# Patient Record
Sex: Female | Born: 1940 | Race: Black or African American | Hispanic: No | Marital: Married | Smoking: Former smoker
Health system: Southern US, Community
[De-identification: ages and names within clinical notes are randomized; demographics above are authoritative.]

## PROBLEM LIST (undated history)

## (undated) DIAGNOSIS — E669 Obesity, unspecified: Secondary | ICD-10-CM

## (undated) DIAGNOSIS — E559 Vitamin D deficiency, unspecified: Secondary | ICD-10-CM

## (undated) DIAGNOSIS — I219 Acute myocardial infarction, unspecified: Secondary | ICD-10-CM

## (undated) DIAGNOSIS — C50919 Malignant neoplasm of unspecified site of unspecified female breast: Secondary | ICD-10-CM

## (undated) DIAGNOSIS — C801 Malignant (primary) neoplasm, unspecified: Secondary | ICD-10-CM

## (undated) DIAGNOSIS — F419 Anxiety disorder, unspecified: Secondary | ICD-10-CM

## (undated) DIAGNOSIS — E785 Hyperlipidemia, unspecified: Secondary | ICD-10-CM

## (undated) DIAGNOSIS — I252 Old myocardial infarction: Secondary | ICD-10-CM

## (undated) DIAGNOSIS — I119 Hypertensive heart disease without heart failure: Secondary | ICD-10-CM

## (undated) DIAGNOSIS — Z86010 Personal history of colonic polyps: Secondary | ICD-10-CM

## (undated) DIAGNOSIS — I2510 Atherosclerotic heart disease of native coronary artery without angina pectoris: Secondary | ICD-10-CM

## (undated) DIAGNOSIS — N189 Chronic kidney disease, unspecified: Secondary | ICD-10-CM

## (undated) HISTORY — DX: Old myocardial infarction: I25.2

## (undated) HISTORY — DX: Atherosclerotic heart disease of native coronary artery without angina pectoris: I25.10

## (undated) HISTORY — DX: Hyperlipidemia, unspecified: E78.5

## (undated) HISTORY — PX: BREAST LUMPECTOMY: SHX2

## (undated) HISTORY — PX: BREAST BIOPSY: SHX20

## (undated) HISTORY — DX: Obesity, unspecified: E66.9

## (undated) HISTORY — PX: EYE SURGERY: SHX253

## (undated) HISTORY — DX: Hypertensive heart disease without heart failure: I11.9

## (undated) HISTORY — DX: Vitamin D deficiency, unspecified: E55.9

## (undated) HISTORY — DX: Acute myocardial infarction, unspecified: I21.9

## (undated) HISTORY — PX: BREAST EXCISIONAL BIOPSY: SUR124

## (undated) HISTORY — DX: Personal history of colonic polyps: Z86.010

## (undated) HISTORY — PX: TONSILLECTOMY: SUR1361

---

## 1990-09-06 HISTORY — PX: CORONARY ARTERY BYPASS GRAFT: SHX141

## 2001-07-04 ENCOUNTER — Other Ambulatory Visit: Admission: RE | Admit: 2001-07-04 | Discharge: 2001-07-04 | Payer: Self-pay | Admitting: Emergency Medicine

## 2001-07-07 ENCOUNTER — Encounter: Admission: RE | Admit: 2001-07-07 | Discharge: 2001-07-07 | Payer: Self-pay | Admitting: Emergency Medicine

## 2001-07-07 ENCOUNTER — Encounter: Payer: Self-pay | Admitting: Emergency Medicine

## 2001-07-24 ENCOUNTER — Encounter: Admission: RE | Admit: 2001-07-24 | Discharge: 2001-07-24 | Payer: Self-pay | Admitting: Emergency Medicine

## 2001-07-24 ENCOUNTER — Encounter: Payer: Self-pay | Admitting: Emergency Medicine

## 2002-07-26 ENCOUNTER — Encounter: Admission: RE | Admit: 2002-07-26 | Discharge: 2002-07-26 | Payer: Self-pay | Admitting: Emergency Medicine

## 2002-07-26 ENCOUNTER — Encounter: Payer: Self-pay | Admitting: Emergency Medicine

## 2004-02-06 ENCOUNTER — Encounter: Admission: RE | Admit: 2004-02-06 | Discharge: 2004-02-06 | Payer: Self-pay | Admitting: Emergency Medicine

## 2005-07-06 ENCOUNTER — Encounter: Admission: RE | Admit: 2005-07-06 | Discharge: 2005-07-06 | Payer: Self-pay | Admitting: Emergency Medicine

## 2006-10-27 ENCOUNTER — Encounter: Admission: RE | Admit: 2006-10-27 | Discharge: 2006-10-27 | Payer: Self-pay | Admitting: Emergency Medicine

## 2006-11-14 ENCOUNTER — Encounter (INDEPENDENT_AMBULATORY_CARE_PROVIDER_SITE_OTHER): Payer: Self-pay | Admitting: *Deleted

## 2006-11-14 ENCOUNTER — Ambulatory Visit (HOSPITAL_COMMUNITY): Admission: RE | Admit: 2006-11-14 | Discharge: 2006-11-14 | Payer: Self-pay | Admitting: Gastroenterology

## 2008-04-11 ENCOUNTER — Encounter: Admission: RE | Admit: 2008-04-11 | Discharge: 2008-04-11 | Payer: Self-pay | Admitting: Emergency Medicine

## 2009-01-24 ENCOUNTER — Encounter: Admission: RE | Admit: 2009-01-24 | Discharge: 2009-01-24 | Payer: Self-pay | Admitting: Emergency Medicine

## 2009-01-31 ENCOUNTER — Ambulatory Visit: Payer: Self-pay | Admitting: Gynecology

## 2009-02-15 ENCOUNTER — Emergency Department (HOSPITAL_COMMUNITY): Admission: EM | Admit: 2009-02-15 | Discharge: 2009-02-15 | Payer: Self-pay | Admitting: Emergency Medicine

## 2010-07-08 ENCOUNTER — Encounter: Admission: RE | Admit: 2010-07-08 | Discharge: 2010-07-08 | Payer: Self-pay | Admitting: Emergency Medicine

## 2010-12-14 LAB — URINALYSIS, ROUTINE W REFLEX MICROSCOPIC
Bilirubin Urine: NEGATIVE
Hgb urine dipstick: NEGATIVE
Specific Gravity, Urine: 1.014 (ref 1.005–1.030)

## 2011-01-22 NOTE — Op Note (Signed)
NAME:  Alyssa Cardenas, Alyssa Cardenas               ACCOUNT NO.:  [id number]   MEDICAL RECORD NO.:  [id number]          PATIENT TYPE:  AMB   LOCATION:  ENDO                         FACILITY:  [facility]   PHYSICIAN:  Anselmo Rod, M.D.  DATE OF BIRTH:  12-07-1940   DATE OF PROCEDURE:  11/14/2006  DATE OF DISCHARGE:                               OPERATIVE REPORT   PROCEDURE PERFORMED:  Colonoscopy with snare polypectomy x4.   ENDOSCOPIST:  Anselmo Rod, M.D.   INSTRUMENT USED:  Pentax video colonoscope.   INDICATIONS FOR PROCEDURE:  70 year old African American female  undergoing screening colonoscopy to rule out colonic polyps, mass, etc.   PREPROCEDURE PREPARATION:  Informed consent was procured from the  patient. The patient fasted for four hours prior to the procedure and  prepped with 20 Osmoprep pills the night of and 12 Osmoprep pills the  morning of the procedure.  Risks and benefits of the procedure including  a 10% miss rate of cancer and polyps were discussed with the patient as  well.   PREPROCEDURE PHYSICAL:  The patient had stable vital signs.  Neck  supple. Chest clear to auscultation.  S1, S2 regular. Abdomen soft with  normal bowel sounds.   DESCRIPTION OF PROCEDURE:  The patient was placed in the left lateral  decubitus position and sedated with 75 mcg of Fentanyl and 7.5 mg of  Versed given intravenously in slow incremental doses.  Once the patient  was adequately sedated and maintained on low-flow oxygen and continuous  cardiac monitoring, the Pentax video colonoscope was advanced from the  rectum to the cecum.  There was some residual stool in the colon.  Multiple washes were done. The appendiceal orifice and ileocecal valve  were clearly visualized and photographed. The terminal ileum appeared  healthy without lesions.  A 5-6 mm sessile polyp was snared from the  cecum (hot snare times one at normal settings of 200/20). Another 6 mm  sessile polyp along with a smaller  polyp was snared from 80 centimeters  (hot snare x3). There was evidence of sigmoid diverticulosis.  Retroflexion in the rectum revealed no abnormalities.  Multiple washes  were done. The patient tolerated the procedure well without  complications.   IMPRESSION:  1.Cecal polyp removed by hot snare along with two other  polyps from 80 cm.  2.Sigmoid diverticulosis.  3.Some residual stool in the colon, very small lesions could be missed.   RECOMMENDATIONS:  1.Await pathology results.  2.Avoid all nonsteroidals for aspirin for the next two weeks.  3.Brochures on diverticulosis along with a high-fiber diet have been  given to the patient for education.  4.Repeat colonoscopy depending on pathology results.  5.Outpatient follow-up as need arises in the future.      Anselmo Rod, M.D.  Electronically Signed     JNM/MEDQ  D:  11/14/2006  T:  11/14/2006  Job:  [id number]   cc:   Reuben Likes, M.D.  Georga Hacking, M.D.

## 2012-05-02 DIAGNOSIS — I10 Essential (primary) hypertension: Secondary | ICD-10-CM | POA: Insufficient documentation

## 2013-01-23 ENCOUNTER — Other Ambulatory Visit: Payer: Self-pay

## 2013-01-23 DIAGNOSIS — Z1231 Encounter for screening mammogram for malignant neoplasm of breast: Secondary | ICD-10-CM

## 2013-02-27 ENCOUNTER — Ambulatory Visit
Admission: RE | Admit: 2013-02-27 | Discharge: 2013-02-27 | Disposition: A | Discharge status: Home / Self Care | Payer: PRIVATE HEALTH INSURANCE | Source: Ambulatory Visit

## 2013-02-27 DIAGNOSIS — Z1231 Encounter for screening mammogram for malignant neoplasm of breast: Secondary | ICD-10-CM

## 2013-08-14 DIAGNOSIS — L309 Dermatitis, unspecified: Secondary | ICD-10-CM | POA: Insufficient documentation

## 2014-08-23 ENCOUNTER — Other Ambulatory Visit: Payer: Self-pay | Admitting: Internal Medicine

## 2014-08-23 DIAGNOSIS — Z1231 Encounter for screening mammogram for malignant neoplasm of breast: Secondary | ICD-10-CM

## 2014-09-10 ENCOUNTER — Ambulatory Visit
Admission: RE | Admit: 2014-09-10 | Discharge: 2014-09-10 | Disposition: A | Discharge status: Home / Self Care | Payer: Medicare Other | Source: Ambulatory Visit | Attending: Internal Medicine | Admitting: Internal Medicine

## 2014-09-10 DIAGNOSIS — Z1231 Encounter for screening mammogram for malignant neoplasm of breast: Secondary | ICD-10-CM

## 2015-03-10 ENCOUNTER — Ambulatory Visit (INDEPENDENT_AMBULATORY_CARE_PROVIDER_SITE_OTHER): Payer: Medicare Other

## 2015-03-10 ENCOUNTER — Ambulatory Visit (INDEPENDENT_AMBULATORY_CARE_PROVIDER_SITE_OTHER): Payer: Medicare Other | Admitting: Emergency Medicine

## 2015-03-10 VITALS — BP 108/68 | HR 69 | Temp 97.6°F | Resp 16 | Ht 61.0 in | Wt 210.0 lb

## 2015-03-10 DIAGNOSIS — Z860100 Personal history of colon polyps, unspecified: Secondary | ICD-10-CM | POA: Insufficient documentation

## 2015-03-10 DIAGNOSIS — I252 Old myocardial infarction: Secondary | ICD-10-CM

## 2015-03-10 DIAGNOSIS — R05 Cough: Secondary | ICD-10-CM

## 2015-03-10 DIAGNOSIS — J209 Acute bronchitis, unspecified: Secondary | ICD-10-CM | POA: Diagnosis not present

## 2015-03-10 DIAGNOSIS — E559 Vitamin D deficiency, unspecified: Secondary | ICD-10-CM | POA: Insufficient documentation

## 2015-03-10 DIAGNOSIS — I119 Hypertensive heart disease without heart failure: Secondary | ICD-10-CM | POA: Insufficient documentation

## 2015-03-10 DIAGNOSIS — R059 Cough, unspecified: Secondary | ICD-10-CM

## 2015-03-10 DIAGNOSIS — E785 Hyperlipidemia, unspecified: Secondary | ICD-10-CM | POA: Insufficient documentation

## 2015-03-10 HISTORY — DX: Vitamin D deficiency, unspecified: E55.9

## 2015-03-10 HISTORY — DX: Personal history of colon polyps, unspecified: Z86.0100

## 2015-03-10 HISTORY — DX: Personal history of colonic polyps: Z86.010

## 2015-03-10 HISTORY — DX: Old myocardial infarction: I25.2

## 2015-03-10 LAB — POCT CBC
Granulocyte percent: 56.6 %G (ref 37–80)
HEMATOCRIT: 41.1 % (ref 37.7–47.9)
Hemoglobin: 13 g/dL (ref 12.2–16.2)
Lymph, poc: 2 (ref 0.6–3.4)
MCH: 25.4 pg — AB (ref 27–31.2)
MCHC: 31.5 g/dL — AB (ref 31.8–35.4)
MCV: 80.6 fL (ref 80–97)
MID (cbc): 0.4 (ref 0–0.9)
MPV: 6.5 fL (ref 0–99.8)
PLATELET COUNT, POC: 260 10*3/uL (ref 142–424)
POC Granulocyte: 3.2 (ref 2–6.9)
POC LYMPH %: 36.1 % (ref 10–50)
POC MID %: 7.3 % (ref 0–12)
RBC: 5.1 M/uL (ref 4.04–5.48)
RDW, POC: 15.4 %
WBC: 5.6 10*3/uL (ref 4.6–10.2)

## 2015-03-10 MED ORDER — AZITHROMYCIN 250 MG PO TABS: ORAL_TABLET | ORAL | Status: DC

## 2015-03-10 MED ORDER — GUAIFENESIN ER 600 MG PO TB12: 600.0000 mg | ORAL_TABLET | Freq: Two times a day (BID) | ORAL | Status: DC

## 2015-03-10 MED ORDER — BENZONATATE 100 MG PO CAPS: 100.0000 mg | ORAL_CAPSULE | Freq: Three times a day (TID) | ORAL | Status: DC | PRN

## 2015-03-10 NOTE — Progress Notes (Signed)
Patient ID: Alyssa Cardenas, female    DOB: 15-Jan-1941, 74 y.o.   MRN: [id number]  PCP: Velna Hatchet, MD   Subjective:  HPI Pt is a 74 y/o female presenting for evaluation of a cough x1 week.  Pt states that she has had a productive cough that is worse at night but also throughout the day for the past week. The sputum is sometimes white and frothy and sometimes yellow/green. She states that she was feeling a little feverish (clammy, sweaty) a few days ago and took her temperature, which was 99.7 F (she states that she usually 25 F). She reports some nasal congestion a few days ago, for which she used an unknown nasal spray, with relief of the congestion. She has also tried Robatussin and cough drops for the cough, without relief. She also states that she has had some fatigue, dysgeusia with related appetite change, sneezing, rhinorrhea, and some wheezing and SOB when she coughs a lot. Denies chest pain, lower extremity edema, post nasal drip, paroxysmal nocturnal dyspnea, chills, weight loss/gain.     Review of Systems  Constitutional: Positive for fever (pt reported), appetite change (d/t dysgeusia) and fatigue. Negative for chills, diaphoresis, activity change and unexpected weight change.  HENT: Positive for congestion (Better now), rhinorrhea (Occasional) and sneezing (More than usual). Negative for dental problem, drooling, ear discharge, ear pain, facial swelling, hearing loss, mouth sores, nosebleeds, postnasal drip, sinus pressure, sore throat, tinnitus, trouble swallowing and voice change.   Eyes: Negative.   Respiratory: Positive for cough, shortness of breath (when she coughs a lot) and wheezing (When she coughs a lot). Negative for apnea, choking, chest tightness and stridor.   Cardiovascular: Negative.  Negative for leg swelling.  Gastrointestinal: Negative.   Musculoskeletal: Negative.   Skin: Negative.   Allergic/Immunologic: Positive for environmental allergies. Negative for  food allergies and immunocompromised state.  Neurological: Negative.   Hematological: Negative.      Patient Active Problem List   Diagnosis Date Noted  . Essential hypertension 03/10/2015  . High cholesterol 03/10/2015  . History of MI (myocardial infarction) 03/10/2015  . History of colonic polyps 03/10/2015  . Vitamin D deficiency 03/10/2015    Past Medical History  Diagnosis Date  . Myocardial infarction   . Hypertension   . Blood transfusion without reported diagnosis     Prior to Admission medications   Medication Sig Start Date End Date Taking? Authorizing Provider  amLODipine (NORVASC) 5 MG tablet Take 5 mg by mouth daily.   Yes Historical Provider, MD  aspirin EC 81 MG tablet Take 81 mg by mouth daily.   Yes Historical Provider, MD  cholecalciferol (VITAMIN D) 1000 UNITS tablet Take 1,000 Units by mouth 2 (two) times daily.   Yes Historical Provider, MD  losartan-hydrochlorothiazide (HYZAAR) 100-25 MG per tablet Take 1 tablet by mouth daily.   Yes Historical Provider, MD  metoprolol tartrate (LOPRESSOR) 25 MG tablet Take 25 mg by mouth once.   Yes Historical Provider, MD  omega-3 acid ethyl esters (LOVAZA) 1 G capsule Take by mouth daily.   Yes Historical Provider, MD  potassium chloride (KLOR-CON) 20 MEQ packet Take by mouth once.   Yes Historical Provider, MD  rosuvastatin (CRESTOR) 40 MG tablet Take 40 mg by mouth daily.   Yes Historical Provider, MD    No Known Allergies  Past Medical, Surgical Family and Social History reviewed and updated.   Objective:   Vitals: BP 108/68 mmHg  Pulse 69  Temp(Src) 97.6  F (36.4 C) (Oral)  Resp 16  Ht 5\' 1"  (1.549 m)  Wt 210 lb (95.255 kg)  BMI 39.70 kg/m2  SpO2 94%   Physical Exam  Constitutional: She is oriented to person, place, and time. She appears well-developed and well-nourished. She is cooperative. No distress.  HENT:  Head: Normocephalic and atraumatic.  Right Ear: Hearing, tympanic membrane, external  ear and ear canal normal.  Left Ear: Hearing, tympanic membrane, external ear and ear canal normal.  Nose: Nose normal. Right sinus exhibits no maxillary sinus tenderness and no frontal sinus tenderness. Left sinus exhibits no maxillary sinus tenderness and no frontal sinus tenderness.  Mouth/Throat: Uvula is midline and mucous membranes are normal. Posterior oropharyngeal erythema present. No oropharyngeal exudate, posterior oropharyngeal edema or tonsillar abscesses.  Eyes: Conjunctivae and lids are normal. Pupils are equal, round, and reactive to light. Right eye exhibits no discharge. Left eye exhibits no discharge.  Neck: Trachea normal and phonation normal. No thyroid mass and no thyromegaly present.  Cardiovascular: Normal rate, regular rhythm, normal heart sounds and normal pulses.  Exam reveals no gallop and no friction rub.   No murmur heard. Pulmonary/Chest: Effort normal. No accessory muscle usage. No respiratory distress. She has no decreased breath sounds. She has no wheezes. She has rhonchi (Course) in the left middle field. She has no rales.  Lymphadenopathy:       Head (right side): No submental, no submandibular, no tonsillar, no preauricular, no posterior auricular and no occipital adenopathy present.       Head (left side): No submental, no submandibular, no tonsillar, no preauricular, no posterior auricular and no occipital adenopathy present.    She has no cervical adenopathy.       Right: No supraclavicular adenopathy present.       Left: No supraclavicular adenopathy present.  Neurological: She is alert and oriented to person, place, and time.  Skin: Skin is warm, dry and intact.  Psychiatric: She has a normal mood and affect. Her speech is normal and behavior is normal. Thought content normal.   Results for orders placed or performed in visit on 03/10/15  POCT CBC  Result Value Ref Range   WBC 5.6 4.6 - 10.2 K/uL   Lymph, poc 2.0 0.6 - 3.4   POC LYMPH PERCENT 36.1 10  - 50 %L   MID (cbc) 0.4 0 - 0.9   POC MID % 7.3 0 - 12 %M   POC Granulocyte 3.2 2 - 6.9   Granulocyte percent 56.6 37 - 80 %G   RBC 5.10 4.04 - 5.48 M/uL   Hemoglobin 13.0 12.2 - 16.2 g/dL   HCT, POC 41.1 37.7 - 47.9 %   MCV 80.6 80 - 97 fL   MCH, POC 25.4 (A) 27 - 31.2 pg   MCHC 31.5 (A) 31.8 - 35.4 g/dL   RDW, POC 15.4 %   Platelet Count, POC 260 142 - 424 K/uL   MPV 6.5 0 - 99.8 fL   [facility] reading (PRIMARY) by  Dr. Everlene Farrier. 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 Heart size is increased. Patient is status post coronary artery bypass graft. There are no infiltrates present there are no areas of consolidation.   Assessment & Plan:   Marycarmen was seen today for cough and wheezing.  Diagnoses and all orders for this visit:  Cough Orders: -     DG Chest 2 View; Future -     POCT CBC -  benzonatate (TESSALON) 100 MG capsule; Take 1-2 capsules (100-200 mg total) by mouth 3 (three) times daily as needed for cough. -     guaiFENesin (MUCINEX) 600 MG 12 hr tablet; Take 1 tablet (600 mg total) by mouth 2 (two) times daily.  Acute bronchitis, unspecified organism -     No areas suspicious for pneumonia seen on CXR. -     Likely viral bronchitis, but will prescribe Azithromycin d/t possible superinfection and the character of her sputum production. -     Supportive care with Mucinex, Tessalon, plenty of fluids, and rest. -     Pt should return if symptoms worsen or she does not see improvement over the next several days. Orders: -     benzonatate (TESSALON) 100 MG capsule; Take 1-2 capsules (100-200 mg total) by mouth 3 (three) times daily as needed for cough. -     guaiFENesin (MUCINEX) 600 MG 12 hr tablet; Take 1 tablet (600 mg total) by mouth 2 (two) times daily. -     azithromycin (ZITHROMAX) 250 MG tablet; Take 2 tabs PO x 1 dose, then 1 tab PO QD x 4 days  Chart forwarded to Dr. Wynonia Lawman with CXR from today. Sent before radiologist reading of the CXR. Pt to  follow-up with Dr. 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   Ariel Hilsinger, PA-S [facility] 03/10/2015 11:14 AM  Richardson Landry A. Everlene Farrier, MD [facility] 03/10/2015 11:53 AM

## 2015-03-10 NOTE — Patient Instructions (Addendum)

## 2015-03-13 ENCOUNTER — Encounter: Payer: Self-pay | Admitting: Cardiology

## 2015-03-13 DIAGNOSIS — I2510 Atherosclerotic heart disease of native coronary artery without angina pectoris: Secondary | ICD-10-CM | POA: Insufficient documentation

## 2015-03-13 DIAGNOSIS — E669 Obesity, unspecified: Secondary | ICD-10-CM | POA: Insufficient documentation

## 2015-03-21 DIAGNOSIS — I77819 Aortic ectasia, unspecified site: Secondary | ICD-10-CM | POA: Insufficient documentation

## 2015-03-25 DIAGNOSIS — N1832 Chronic kidney disease, stage 3b: Secondary | ICD-10-CM | POA: Insufficient documentation

## 2016-01-06 ENCOUNTER — Other Ambulatory Visit: Payer: Self-pay

## 2016-01-06 DIAGNOSIS — Z1231 Encounter for screening mammogram for malignant neoplasm of breast: Secondary | ICD-10-CM

## 2016-01-27 ENCOUNTER — Ambulatory Visit
Admission: RE | Admit: 2016-01-27 | Discharge: 2016-01-27 | Disposition: A | Discharge status: Home / Self Care | Payer: Medicare Other | Source: Ambulatory Visit

## 2016-01-27 DIAGNOSIS — Z1231 Encounter for screening mammogram for malignant neoplasm of breast: Secondary | ICD-10-CM

## 2018-06-23 ENCOUNTER — Other Ambulatory Visit: Payer: Self-pay | Admitting: Internal Medicine

## 2018-06-23 DIAGNOSIS — Z1231 Encounter for screening mammogram for malignant neoplasm of breast: Secondary | ICD-10-CM

## 2018-06-30 ENCOUNTER — Ambulatory Visit
Admission: RE | Admit: 2018-06-30 | Discharge: 2018-06-30 | Disposition: A | Discharge status: Home / Self Care | Payer: Medicare Other | Source: Ambulatory Visit | Attending: Internal Medicine | Admitting: Internal Medicine

## 2018-06-30 DIAGNOSIS — Z1231 Encounter for screening mammogram for malignant neoplasm of breast: Secondary | ICD-10-CM

## 2018-07-04 ENCOUNTER — Other Ambulatory Visit: Payer: Self-pay | Admitting: Internal Medicine

## 2018-07-04 ENCOUNTER — Telehealth: Payer: Self-pay

## 2018-07-04 DIAGNOSIS — R928 Other abnormal and inconclusive findings on diagnostic imaging of breast: Secondary | ICD-10-CM

## 2018-07-04 MED ORDER — EZETIMIBE 10 MG PO TABS: 10.0000 mg | ORAL_TABLET | Freq: Every day | ORAL | 10 refills | Status: DC

## 2018-07-04 NOTE — Telephone Encounter (Signed)
Rx for ezetimibe 10mg  one tablet everyday sent to pharmacy as requested #30 with 10 refills as patient is not due to be seen until Aug 2020.

## 2018-07-10 ENCOUNTER — Other Ambulatory Visit: Payer: Self-pay | Admitting: Internal Medicine

## 2018-07-10 ENCOUNTER — Ambulatory Visit
Admission: RE | Admit: 2018-07-10 | Discharge: 2018-07-10 | Disposition: A | Discharge status: Home / Self Care | Payer: Medicare Other | Source: Ambulatory Visit | Attending: Internal Medicine | Admitting: Internal Medicine

## 2018-07-10 DIAGNOSIS — R928 Other abnormal and inconclusive findings on diagnostic imaging of breast: Secondary | ICD-10-CM

## 2018-07-10 DIAGNOSIS — N6320 Unspecified lump in the left breast, unspecified quadrant: Secondary | ICD-10-CM

## 2018-07-17 ENCOUNTER — Other Ambulatory Visit: Payer: Self-pay | Admitting: Internal Medicine

## 2018-07-17 ENCOUNTER — Ambulatory Visit
Admission: RE | Admit: 2018-07-17 | Discharge: 2018-07-17 | Disposition: A | Discharge status: Home / Self Care | Payer: Medicare Other | Source: Ambulatory Visit | Attending: Internal Medicine | Admitting: Internal Medicine

## 2018-07-17 DIAGNOSIS — N6320 Unspecified lump in the left breast, unspecified quadrant: Secondary | ICD-10-CM

## 2018-07-20 ENCOUNTER — Telehealth: Payer: Self-pay | Admitting: Hematology and Oncology

## 2018-07-20 NOTE — Telephone Encounter (Signed)
Spoke with patient to confirm morning [facility] appointment for 11/20, packet mailed to patient

## 2018-07-21 ENCOUNTER — Encounter: Payer: Self-pay | Admitting: *Deleted

## 2018-07-21 DIAGNOSIS — D0512 Intraductal carcinoma in situ of left breast: Secondary | ICD-10-CM | POA: Insufficient documentation

## 2018-07-21 HISTORY — DX: Intraductal carcinoma in situ of left breast: D05.12

## 2018-07-26 ENCOUNTER — Ambulatory Visit
Admission: RE | Admit: 2018-07-26 | Discharge: 2018-07-26 | Disposition: A | Discharge status: Home / Self Care | Payer: Medicare Other | Source: Ambulatory Visit | Attending: Radiation Oncology | Admitting: Radiation Oncology

## 2018-07-26 ENCOUNTER — Ambulatory Visit: Payer: Medicare Other | Admitting: Physical Therapy

## 2018-07-26 ENCOUNTER — Ambulatory Visit: Payer: Self-pay | Admitting: General Surgery

## 2018-07-26 ENCOUNTER — Inpatient Hospital Stay: Payer: Medicare Other

## 2018-07-26 ENCOUNTER — Encounter: Payer: Self-pay | Admitting: Hematology and Oncology

## 2018-07-26 ENCOUNTER — Telehealth: Payer: Self-pay | Admitting: Hematology and Oncology

## 2018-07-26 ENCOUNTER — Inpatient Hospital Stay: Payer: Medicare Other | Attending: Hematology and Oncology | Admitting: Hematology and Oncology

## 2018-07-26 DIAGNOSIS — I2510 Atherosclerotic heart disease of native coronary artery without angina pectoris: Secondary | ICD-10-CM | POA: Diagnosis not present

## 2018-07-26 DIAGNOSIS — Z87891 Personal history of nicotine dependence: Secondary | ICD-10-CM | POA: Insufficient documentation

## 2018-07-26 DIAGNOSIS — E785 Hyperlipidemia, unspecified: Secondary | ICD-10-CM | POA: Diagnosis not present

## 2018-07-26 DIAGNOSIS — Z7982 Long term (current) use of aspirin: Secondary | ICD-10-CM | POA: Insufficient documentation

## 2018-07-26 DIAGNOSIS — Z79899 Other long term (current) drug therapy: Secondary | ICD-10-CM | POA: Diagnosis not present

## 2018-07-26 DIAGNOSIS — Z170 Estrogen receptor positive status [ER+]: Secondary | ICD-10-CM | POA: Diagnosis not present

## 2018-07-26 DIAGNOSIS — Z951 Presence of aortocoronary bypass graft: Secondary | ICD-10-CM | POA: Diagnosis not present

## 2018-07-26 DIAGNOSIS — D0512 Intraductal carcinoma in situ of left breast: Secondary | ICD-10-CM

## 2018-07-26 DIAGNOSIS — I119 Hypertensive heart disease without heart failure: Secondary | ICD-10-CM | POA: Insufficient documentation

## 2018-07-26 DIAGNOSIS — I252 Old myocardial infarction: Secondary | ICD-10-CM | POA: Insufficient documentation

## 2018-07-26 LAB — CBC WITH DIFFERENTIAL (CANCER CENTER ONLY)
ABS IMMATURE GRANULOCYTES: 0.01 10*3/uL (ref 0.00–0.07)
BASOS ABS: 0.1 10*3/uL (ref 0.0–0.1)
Basophils Relative: 1 %
EOS PCT: 4 %
Eosinophils Absolute: 0.2 10*3/uL (ref 0.0–0.5)
HCT: 41 % (ref 36.0–46.0)
HEMOGLOBIN: 12.4 g/dL (ref 12.0–15.0)
IMMATURE GRANULOCYTES: 0 %
Lymphocytes Relative: 38 %
Lymphs Abs: 1.8 10*3/uL (ref 0.7–4.0)
MCH: 26.4 pg (ref 26.0–34.0)
MCHC: 30.2 g/dL (ref 30.0–36.0)
MCV: 87.4 fL (ref 80.0–100.0)
MONO ABS: 0.5 10*3/uL (ref 0.1–1.0)
Monocytes Relative: 11 %
NEUTROS ABS: 2.2 10*3/uL (ref 1.7–7.7)
NRBC: 0 % (ref 0.0–0.2)
Neutrophils Relative %: 46 %
PLATELETS: 217 10*3/uL (ref 150–400)
RBC: 4.69 MIL/uL (ref 3.87–5.11)
RDW: 14.8 % (ref 11.5–15.5)
WBC: 4.8 10*3/uL (ref 4.0–10.5)

## 2018-07-26 LAB — CMP (CANCER CENTER ONLY)
ALBUMIN: 3.9 g/dL (ref 3.5–5.0)
ALT: 18 U/L (ref 0–44)
AST: 23 U/L (ref 15–41)
Alkaline Phosphatase: 52 U/L (ref 38–126)
Anion gap: 10 (ref 5–15)
BUN: 15 mg/dL (ref 8–23)
CALCIUM: 9.8 mg/dL (ref 8.9–10.3)
CHLORIDE: 105 mmol/L (ref 98–111)
CO2: 26 mmol/L (ref 22–32)
CREATININE: 1.21 mg/dL — AB (ref 0.44–1.00)
GFR, Est AFR Am: 49 mL/min — ABNORMAL LOW (ref >60)
GFR, Estimated: 43 mL/min — ABNORMAL LOW (ref >60)
GLUCOSE: 125 mg/dL — AB (ref 70–99)
Potassium: 3.2 mmol/L — ABNORMAL LOW (ref 3.5–5.1)
SODIUM: 141 mmol/L (ref 135–145)
Total Bilirubin: 0.5 mg/dL (ref 0.3–1.2)
Total Protein: 7.4 g/dL (ref 6.5–8.1)

## 2018-07-26 NOTE — Progress Notes (Signed)
[facility] NOTE  Patient Care Team: Velna Hatchet, MD as PCP - General (Internal Medicine) Jacolyn Reedy, MD as Consulting Physician (Cardiology) Jovita Kussmaul, MD as Consulting Physician (General Surgery) Nicholas Lose, MD as Consulting Physician (Hematology and Oncology) Gery Pray, MD as Consulting Physician (Radiation Oncology)  CHIEF COMPLAINTS/PURPOSE OF CONSULTATION:  Newly diagnosed breast cancer  HISTORY OF PRESENTING ILLNESS:  Alyssa Cardenas 77 y.o. female is here because of recent diagnosis of left breast DCIS.  Patient had a routine screening mammogram that detected a mass in the left breast 11 o'clock position 1.6 cm in size.  Biopsy of which revealed intermediate grade DCIS that was ER PR positive.  She was presented this morning to the multidisciplinary tumor board and she is here today accompanied by her friend to discuss her treatment plan.  I reviewed her records extensively and collaborated the history with the patient.  SUMMARY OF ONCOLOGIC HISTORY:   Ductal carcinoma in situ (DCIS) of left breast   07/17/2018 Initial Diagnosis    Screening detected left breast mass 11 o'clock position 1.6 cm with irregular margins axilla negative biopsy revealed intermediate grade DCIS ER 100%, PR 90%, Tis NX stage 0      MEDICAL HISTORY:  Past Medical History:  Diagnosis Date  . CAD (coronary artery disease), native coronary artery    Cath 08/27/1991 Normal Left main, calcified proximal LAD, 50% stenosis proximal LAD, 90% stenosis mid LAD, occluded Diag 1, 95% stenosis proximal Diag 2, 95% stenosis proximal CFX, 95% stenosis proximal OM 1, occluded RCA;  CABG w LIMA to LAD, SVG to dx, SVG to OM1-2-circ, SVG to PDA 08/29/1991 Dr. Redmond Pulling    . History of colonic polyps 03/10/2015  . 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 Hyperlipidemia   . Hypertensive heart disease    . Obesity (BMI 30-39.9)   .  Vitamin D deficiency 03/10/2015    SURGICAL HISTORY: Past Surgical History:  Procedure Laterality Date  . BREAST BIOPSY    . BREAST EXCISIONAL BIOPSY Bilateral   . CORONARY ARTERY BYPASS GRAFT  1992    SOCIAL HISTORY: Social History   Socioeconomic History  . Marital status: Married    Spouse name: Not on file  . Number of children: Not on file  . Years of education: Not on file  . Highest education level: Not on file  Occupational History  . Not on file  Social Needs  . Financial resource strain: Not on file  . Food insecurity:    Worry: Not on file    Inability: Not on file  . Transportation needs:    Medical: Not on file    Non-medical: Not on file  Tobacco Use  . Smoking status: Former Smoker    Last attempt to quit: 03/10/1975    Years since quitting: 43.4  . Smokeless tobacco: Never Used  Substance and Sexual Activity  . Alcohol use: Yes    Alcohol/week: 0.0 standard drinks    Comment: Special occasions  . Drug use: No  . Sexual activity: Yes    Birth control/protection: Post-menopausal  Lifestyle  . Physical activity:    Days per week: Not on file    Minutes per session: Not on file  . Stress: Not on file  Relationships  . Social connections:    Talks on phone: Not on file    Gets together: Not on file  Attends religious service: Not on file    Active member of club or organization: Not on file    Attends meetings of clubs or organizations: Not on file    Relationship status: Not on file  . Intimate partner violence:    Fear of current or ex partner: Not on file    Emotionally abused: Not on file    Physically abused: Not on file    Forced sexual activity: Not on file  Other Topics Concern  . Not on file  Social History Narrative  . Not on file    FAMILY HISTORY: Family History  Problem Relation Age of Onset  . Heart disease Mother   . Hyperlipidemia Mother   . Hypertension Mother   . Heart disease Father   . Hypertension Father   . Lung  cancer Father   . Heart disease Brother   . Hyperlipidemia Brother   . Hypertension Brother   . Mental illness Brother   . Diabetes Maternal Grandmother   . Stroke Maternal Grandfather   . Stroke Paternal Grandmother   . Heart disease Paternal Grandfather   . Heart disease Brother   . Hyperlipidemia Brother   . Hypertension Brother     ALLERGIES:  has No Known Allergies.  MEDICATIONS:  Current Outpatient Medications  Medication Sig Dispense Refill  . amLODipine (NORVASC) 5 MG tablet Take 5 mg by mouth daily. Pt reports 10mg  daily    . aspirin EC 81 MG tablet Take 81 mg by mouth daily.    Marland Kitchen azithromycin (ZITHROMAX) 250 MG tablet Take 2 tabs PO x 1 dose, then 1 tab PO QD x 4 days 6 tablet 0  . dimenhyDRINATE (DRAMAMINE) 50 MG tablet Take 50 mg by mouth as needed.    . ezetimibe (ZETIA) 10 MG tablet Take 1 tablet (10 mg total) by mouth daily. 30 tablet 10  . loratadine (CLARITIN) 10 MG tablet Take 10 mg by mouth as needed for allergies.    Marland Kitchen losartan-hydrochlorothiazide (HYZAAR) 100-25 MG per tablet Take 1 tablet by mouth daily. Pt reports 1/2 tablet daily    . metoprolol tartrate (LOPRESSOR) 25 MG tablet Take 25 mg by mouth once.    . nitroGLYCERIN (NITROSTAT) 0.4 MG SL tablet Place 0.4 mg under the tongue every 5 (five) minutes as needed for chest pain.    Marland Kitchen omega-3 acid ethyl esters (LOVAZA) 1 G capsule Take by mouth daily.    . potassium chloride (KLOR-CON) 20 MEQ packet Take by mouth once.    . rosuvastatin (CRESTOR) 40 MG tablet Take 40 mg by mouth daily.    . benzonatate (TESSALON) 100 MG capsule Take 1-2 capsules (100-200 mg total) by mouth 3 (three) times daily as needed for cough. (Patient not taking: Reported on 07/26/2018) 40 capsule 0  . cholecalciferol (VITAMIN D) 1000 UNITS tablet Take 1,000 Units by mouth 2 (two) times daily.    Marland Kitchen guaiFENesin (MUCINEX) 600 MG 12 hr tablet Take 1 tablet (600 mg total) by mouth 2 (two) times daily. (Patient not taking: Reported on  07/26/2018) 14 tablet 0   No current facility-administered medications for this visit.     REVIEW OF SYSTEMS:   Constitutional: Denies fevers, chills or abnormal night sweats Eyes: Denies blurriness of vision, double vision or watery eyes Ears, nose, mouth, throat, and face: Denies mucositis or sore throat Respiratory: Denies cough, dyspnea or wheezes Cardiovascular: Denies palpitation, chest discomfort or lower extremity swelling Gastrointestinal:  Denies nausea, heartburn or change in bowel  habits Skin: Denies abnormal skin rashes Lymphatics: Denies new lymphadenopathy or easy bruising Neurological:Denies numbness, tingling or new weaknesses Behavioral/Psych: Mood is stable, no new changes  Breast:  Denies any palpable lumps or discharge All other systems were reviewed with the patient and are negative.  PHYSICAL EXAMINATION: ECOG PERFORMANCE STATUS: 0 - Asymptomatic  Vitals:   07/26/18 0837  BP: (!) 123/58  Pulse: 72  Resp: 18  Temp: (!) 97.5 F (36.4 C)  SpO2: 95%   Filed Weights   07/26/18 0837  Weight: 208 lb 3.2 oz (94.4 kg)    GENERAL:alert, no distress and comfortable SKIN: skin color, texture, turgor are normal, no rashes or significant lesions EYES: normal, conjunctiva are pink and non-injected, sclera clear OROPHARYNX:no exudate, no erythema and lips, buccal mucosa, and tongue normal  NECK: supple, thyroid normal size, non-tender, without nodularity LYMPH:  no palpable lymphadenopathy in the cervical, axillary or inguinal LUNGS: clear to auscultation and percussion with normal breathing effort HEART: regular rate & rhythm and no murmurs and no lower extremity edema ABDOMEN:abdomen soft, non-tender and normal bowel sounds Musculoskeletal:no cyanosis of digits and no clubbing  PSYCH: alert & oriented x 3 with fluent speech NEURO: no focal motor/sensory deficits BREAST: No palpable nodules in breast. No palpable axillary or supraclavicular lymphadenopathy  (exam performed in the presence of a chaperone)   LABORATORY DATA:  I have reviewed the data as listed Lab Results  Component Value Date   WBC 4.8 07/26/2018   HGB 12.4 07/26/2018   HCT 41.0 07/26/2018   MCV 87.4 07/26/2018   PLT 217 07/26/2018   Lab Results  Component Value Date   NA 141 07/26/2018   K 3.2 (L) 07/26/2018   CL 105 07/26/2018   CO2 26 07/26/2018    RADIOGRAPHIC STUDIES: I have personally reviewed the radiological reports and agreed with the findings in the report.  ASSESSMENT AND PLAN:  Ductal carcinoma in situ (DCIS) of left breast 07/17/2018: Screening detected left breast mass 11 o'clock position 1.6 cm with irregular margins axilla negative biopsy revealed intermediate grade DCIS ER 100%, PR 90%, Tis NX stage 0  Pathology review: I discussed with the patient the difference between DCIS and invasive breast cancer. It is considered a precancerous lesion. DCIS is classified as a 0. It is generally detected through mammograms as calcifications. We discussed the significance of grades and its impact on prognosis. We also discussed the importance of ER and PR receptors and their implications to adjuvant treatment options. Prognosis of DCIS dependence on grade, comedo necrosis. It is anticipated that if not treated, 20-30% of DCIS can develop into invasive breast cancer.  Recommendation: 1. Breast conserving surgery 2. radiation oncology felt that she may not get much benefit from adjuvant radiation 3. Followed by antiestrogen therapy with tamoxifen 5 years  Tamoxifen counseling: We discussed the risks and benefits of tamoxifen. These include but not limited to insomnia, hot flashes, mood changes, vaginal dryness, and weight gain. Although rare, serious side effects including endometrial cancer, risk of blood clots were also discussed. We strongly believe that the benefits far outweigh the risks. Patient understands these risks and consented to starting treatment.  Planned treatment duration is 5 years.  Return to clinic after surgery to discuss the final pathology report and come up with an adjuvant treatment plan.     All questions were answered. The patient knows to call the clinic with any problems, questions or concerns.    Harriette Ohara, MD 07/26/18
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
## 2018-07-26 NOTE — Progress Notes (Signed)
Nutrition Assessment  Reason for Assessment:  Pt seen in Breast Clinic  ASSESSMENT:   77 year old female with new diagnosis of breast cancer.  Past medical history of HTN, heart bypass  Patient reports normal appetite except for recently decreased due to new diagnosis of breast cancer.  Feels that it will resolve after meeting today.  Medications:  reviewed  Labs: reviewed  Anthropometrics:   Height: 61 inches Weight: 208 lb 3.2 oz BMI: 39   NUTRITION DIAGNOSIS: Food and nutrition related knowledge deficit related to new diagnosis of breast cancer as evidenced by no prior need for nutrition related information.  INTERVENTION:   Discussed and provided packet of information regarding nutritional tips for breast cancer patients.  Questions answered.  Teachback method used.  Contact information provided and patient knows to contact me with questions/concerns.    MONITORING, EVALUATION, and GOAL: Pt will consume a healthy plant based diet to maintain lean body mass throughout treatment.   Allyah Heather B. Zenia Resides, [place], [place] Registered Dietitian [phone] (pager)

## 2018-07-26 NOTE — Telephone Encounter (Signed)
No 11/20 los.

## 2018-07-26 NOTE — Assessment & Plan Note (Signed)
07/17/2018: Screening detected left breast mass 11 o'clock position 1.6 cm with irregular margins axilla negative biopsy revealed intermediate grade DCIS ER 100%, PR 90%, Tis NX stage 0  Pathology review: I discussed with the patient the difference between DCIS and invasive breast cancer. It is considered a precancerous lesion. DCIS is classified as a 0. It is generally detected through mammograms as calcifications. We discussed the significance of grades and its impact on prognosis. We also discussed the importance of ER and PR receptors and their implications to adjuvant treatment options. Prognosis of DCIS dependence on grade, comedo necrosis. It is anticipated that if not treated, 20-30% of DCIS can develop into invasive breast cancer.  Recommendation: 1. Breast conserving surgery 2. radiation oncology felt that she may not get much benefit from adjuvant radiation 3. Followed by antiestrogen therapy with tamoxifen 5 years  Tamoxifen counseling: We discussed the risks and benefits of tamoxifen. These include but not limited to insomnia, hot flashes, mood changes, vaginal dryness, and weight gain. Although rare, serious side effects including endometrial cancer, risk of blood clots were also discussed. We strongly believe that the benefits far outweigh the risks. Patient understands these risks and consented to starting treatment. Planned treatment duration is 5 years.  Return to clinic after surgery to discuss the final pathology report and come up with an adjuvant treatment plan.

## 2018-08-01 ENCOUNTER — Ambulatory Visit (HOSPITAL_BASED_OUTPATIENT_CLINIC_OR_DEPARTMENT_OTHER)
Admission: RE | Admit: 2018-08-01 | Discharge: 2018-08-01 | Disposition: A | Discharge status: Home / Self Care | Payer: Medicare Other | Source: Ambulatory Visit | Attending: Cardiology | Admitting: Cardiology

## 2018-08-01 ENCOUNTER — Ambulatory Visit (INDEPENDENT_AMBULATORY_CARE_PROVIDER_SITE_OTHER): Payer: Medicare Other | Admitting: Cardiology

## 2018-08-01 ENCOUNTER — Telehealth: Payer: Self-pay | Admitting: *Deleted

## 2018-08-01 ENCOUNTER — Encounter: Payer: Self-pay | Admitting: Cardiology

## 2018-08-01 VITALS — BP 110/70 | HR 72 | Ht 62.0 in | Wt 207.8 lb

## 2018-08-01 DIAGNOSIS — I2510 Atherosclerotic heart disease of native coronary artery without angina pectoris: Secondary | ICD-10-CM

## 2018-08-01 DIAGNOSIS — I252 Old myocardial infarction: Secondary | ICD-10-CM

## 2018-08-01 DIAGNOSIS — I255 Ischemic cardiomyopathy: Secondary | ICD-10-CM

## 2018-08-01 DIAGNOSIS — I447 Left bundle-branch block, unspecified: Secondary | ICD-10-CM

## 2018-08-01 DIAGNOSIS — Z01810 Encounter for preprocedural cardiovascular examination: Secondary | ICD-10-CM

## 2018-08-01 DIAGNOSIS — Z951 Presence of aortocoronary bypass graft: Secondary | ICD-10-CM

## 2018-08-01 DIAGNOSIS — I429 Cardiomyopathy, unspecified: Secondary | ICD-10-CM

## 2018-08-01 DIAGNOSIS — E782 Mixed hyperlipidemia: Secondary | ICD-10-CM

## 2018-08-01 DIAGNOSIS — E669 Obesity, unspecified: Secondary | ICD-10-CM

## 2018-08-01 HISTORY — DX: Encounter for preprocedural cardiovascular examination: Z01.810

## 2018-08-01 HISTORY — DX: Presence of aortocoronary bypass graft: Z95.1

## 2018-08-01 HISTORY — DX: Left bundle-branch block, unspecified: I44.7

## 2018-08-01 HISTORY — DX: Cardiomyopathy, unspecified: I42.9

## 2018-08-01 LAB — ECHOCARDIOGRAM COMPLETE
Height: 62 in
WEIGHTICAEL: 3324.8 [oz_av]

## 2018-08-01 NOTE — Progress Notes (Addendum)
Did not perform AAA

## 2018-08-01 NOTE — Progress Notes (Signed)
Cardiology Office Note:    Date:  08/01/2018   ID:  [name], DOB [birth year], MRN [id number]  PCP:  Velna Hatchet, MD  Cardiologist:  Jenne Campus, MD    Referring MD: Velna Hatchet, MD   Chief Complaint  Patient presents with  . Pre-op Exam    Lumpectomy  I need lumpectomy  History of Present Illness:    Alyssa Cardenas is a 77 y.o. female with quite complex past medical history in 1992 she suffered from myocardial infarction as a result of which she had cardiac catheterization done which showed significant lesions in multiple arteries and coronary artery bypass graft was done since that time she had few stress test always negative and overall seems to be doing well she is obese she had difficulty moving around she does wear Fitbit typical daily a number of steps she does is about 3000.  Denies have any chest pain tightness squeezing pressure burning chest but she gets short of breath while walking.  When she presented with microinfarction 1992 she gets chest tightness as well as neck pain.  She does not exercise on the regular basis does have family history of premature coronary artery disease also her son died prematurely because of massive microinfarction.  She does not smoke does have dyslipidemia which is well managed she was referred to me because she required lumpectomy overall lumpectomy is considered low risk surgery from cardiac standpoint review but anesthesia that will be used for it which I understand will be either general or spinal carries some significant risk therefore evaluation is needed.  Past Medical History:  Diagnosis Date  . CAD (coronary artery disease), native coronary artery    Cath 08/27/1991 Normal Left main, calcified proximal LAD, 50% stenosis proximal LAD, 90% stenosis mid LAD, occluded Diag 1, 95% stenosis proximal Diag 2, 95% stenosis proximal CFX, 95% stenosis proximal OM 1, occluded RCA;  CABG w LIMA to LAD, SVG to dx, SVG to OM1-2-circ, SVG  to PDA 08/29/1991 Dr. Redmond Pulling    . History of colonic polyps 03/10/2015  . 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 Hyperlipidemia   . Hypertensive heart disease    . Obesity (BMI 30-39.9)   . Vitamin D deficiency 03/10/2015    Past Surgical History:  Procedure Laterality Date  . BREAST BIOPSY    . BREAST EXCISIONAL BIOPSY Bilateral   . CORONARY ARTERY BYPASS GRAFT  1992    Current Medications: Current Meds  Medication Sig  . amLODipine (NORVASC) 5 MG tablet Take 5 mg by mouth daily. Pt reports 10mg  daily  . aspirin EC 81 MG tablet Take 81 mg by mouth daily.  . cholecalciferol (VITAMIN D) 1000 UNITS tablet Take 1,000 Units by mouth 2 (two) times daily.  Marland Kitchen dimenhyDRINATE (DRAMAMINE) 50 MG tablet Take 50 mg by mouth as needed.  . ezetimibe (ZETIA) 10 MG tablet Take 1 tablet (10 mg total) by mouth daily.  Marland Kitchen loratadine (CLARITIN) 10 MG tablet Take 10 mg by mouth as needed for allergies.  Marland Kitchen losartan-hydrochlorothiazide (HYZAAR) 100-25 MG per tablet Take 1 tablet by mouth daily. Pt reports 1/2 tablet daily  . metoprolol tartrate (LOPRESSOR) 25 MG tablet Take 25 mg by mouth once.  . nitroGLYCERIN (NITROSTAT) 0.4 MG SL tablet Place 0.4 mg under the tongue every 5 (five) minutes as needed for chest pain.  Marland Kitchen omega-3 acid ethyl esters (LOVAZA) 1 G capsule Take by  mouth daily.  . potassium chloride (KLOR-CON) 20 MEQ packet Take by mouth once.  . rosuvastatin (CRESTOR) 40 MG tablet Take 40 mg by mouth daily.     Allergies:   Patient has no known allergies.   Social History   Socioeconomic History  . Marital status: Married    Spouse name: Not on file  . Number of children: Not on file  . Years of education: Not on file  . Highest education level: Not on file  Occupational History  . Not on file  Social Needs  . Financial resource strain: Not on file  . Food insecurity:    Worry: Not on file    Inability: Not on file  . Transportation  needs:    Medical: Not on file    Non-medical: Not on file  Tobacco Use  . Smoking status: Former Smoker    Last attempt to quit: 03/10/1975    Years since quitting: 43.4  . Smokeless tobacco: Never Used  Substance and Sexual Activity  . Alcohol use: Yes    Alcohol/week: 0.0 standard drinks    Comment: Special occasions  . Drug use: No  . Sexual activity: Yes    Birth control/protection: Post-menopausal  Lifestyle  . Physical activity:    Days per week: Not on file    Minutes per session: Not on file  . Stress: Not on file  Relationships  . Social connections:    Talks on phone: Not on file    Gets together: Not on file    Attends religious service: Not on file    Active member of club or organization: Not on file    Attends meetings of clubs or organizations: Not on file    Relationship status: Not on file  Other Topics Concern  . Not on file  Social History Narrative  . Not on file     Family History: The patient's family history includes Diabetes in her maternal grandmother; Heart disease in her brother, brother, father, mother, and paternal grandfather; Hyperlipidemia in her brother, brother, and mother; Hypertension in her brother, brother, father, and mother; Lung cancer in her father; Mental illness in her brother; Stroke in her maternal grandfather and paternal grandmother. ROS:   Please see the history of present illness.    All 14 point review of systems negative except as described per history of present illness  EKGs/Labs/Other Studies Reviewed:    Normal sinus rhythm normal P interval left bundle branch block  Recent Labs: 07/26/2018: ALT 18; BUN 15; Creatinine 1.21; Hemoglobin 12.4; Platelet Count 217; Potassium 3.2; Sodium 141  Recent Lipid Panel No results found for: CHOL, TRIG, HDL, CHOLHDL, VLDL, LDLCALC, LDLDIRECT  Physical Exam:    VS:  BP 110/70   Pulse 72   Ht 5\' 2"  (1.575 m)   Wt 207 lb 12.8 oz (94.3 kg)   SpO2 96%   BMI 38.01 kg/m      Wt Readings from Last 3 Encounters:  08/01/18 207 lb 12.8 oz (94.3 kg)  07/26/18 208 lb 3.2 oz (94.4 kg)  03/10/15 210 lb (95.3 kg)     GEN:  Well nourished, well developed in no acute distress HEENT: Normal NECK: No JVD; No carotid bruits LYMPHATICS: No lymphadenopathy CARDIAC: RRR, no murmurs, no rubs, no gallops RESPIRATORY:  Clear to auscultation without rales, wheezing or rhonchi  ABDOMEN: Soft, non-tender, non-distended MUSCULOSKELETAL:  No edema; No deformity  SKIN: Warm and dry LOWER EXTREMITIES: no swelling NEUROLOGIC:  Alert and oriented  x 3 PSYCHIATRIC:  Normal affect   ASSESSMENT:    1. Coronary artery disease involving native coronary artery of native heart without angina pectoris   2. Status post coronary artery bypass graft   3. Ischemic cardiomyopathy   4. Left bundle branch block   5. History of MI (myocardial infarction)   6. Mixed hyperlipidemia   7. Obesity (BMI 30-39.9)   8. Preop cardiovascular exam    PLAN:    In order of problems listed above:  1. Coronary artery disease doing well from that point review.  But she is sedentary I will ask her to have stress test which will be done in form of Lexiscan to make sure there is no significant reactivation of coronary artery disease. 2. Status post coronary artery bypass grafting well from that point review plan as outlined above. 3. Ischemic cardia myopathy I will ask her to have echocardiogram to assess left ventricular ejection fraction. 4. History of myocardial function again echocardiogram will be done. 5. Mixed dyslipidemia on statin high intensity which I will continue. 6. Obesity she understands she need to lose some weight. 7. Preop cardiovascular examination.  If stress test or echocardiogram is negative she will be acceptable candidate for surgery.   Medication Adjustments/Labs and Tests Ordered: Current medicines are reviewed at length with the patient today.  Concerns regarding medicines  are outlined above.  No orders of the defined types were placed in this encounter.  Medication changes: No orders of the defined types were placed in this encounter.   Signed, Park Liter, MD, [facility] 08/01/2018 8:47 AM    [place]

## 2018-08-01 NOTE — Telephone Encounter (Signed)
Called pt to discuss [place] from 11.20.19. Unable to leave vm as one has not been set up and no answer. Will try again

## 2018-08-01 NOTE — Progress Notes (Signed)
  Echocardiogram 2D Echocardiogram has been performed.  08/01/2018, 11:45 AM   Alyssa Cardenas

## 2018-08-01 NOTE — Patient Instructions (Signed)
Medication Instructions:  Your physician recommends that you continue on your current medications as directed. Please refer to the Current Medication list given to you today.  If you need a refill on your cardiac medications before your next appointment, please call your pharmacy.   Lab work: None ordered If you have labs (blood work) drawn today and your tests are completely normal, you will receive your results only by: Marland Kitchen MyChart Message (if you have MyChart) OR . A paper copy in the mail If you have any lab test that is abnormal or we need to change your treatment, we will call you to review the results.  Testing/Procedures: Your physician has requested that you have an echocardiogram. Echocardiography is a painless test that uses sound waves to create images of your heart. It provides your doctor with information about the size and shape of your heart and how well your heart's chambers and valves are working. This procedure takes approximately one hour. There are no restrictions for this procedure.  Your physician has requested that you have a lexiscan myoview. For further information please visit [email or web address]. Please follow instruction sheet, as given.  EKG Today  Follow-Up: At [facility], you and your health needs are our priority.  As part of our continuing mission to provide you with exceptional heart care, we have created designated Provider Care Teams.  These Care Teams include your primary Cardiologist (physician) and Advanced Practice Providers (APPs -  Physician Assistants and Nurse Practitioners) who all work together to provide you with the care you need, when you need it. You will need a follow up appointment in 5 months.  Please call our office 2 months in advance to schedule this appointment.  You may see  Dr. Wynonia Lawman or Dr. Agustin Cree or another member of our [facility] Provider Team in [place]: Shirlee More, MD . Jyl Heinz, MD  Any Other Special  Instructions Will Be Listed Below (If Applicable).

## 2018-08-02 ENCOUNTER — Telehealth: Payer: Self-pay | Admitting: Emergency Medicine

## 2018-08-02 ENCOUNTER — Other Ambulatory Visit: Payer: Self-pay | Admitting: General Surgery

## 2018-08-02 DIAGNOSIS — D0512 Intraductal carcinoma in situ of left breast: Secondary | ICD-10-CM

## 2018-08-02 NOTE — Telephone Encounter (Signed)
Left message for patient to return phone call regarding echo results.

## 2018-08-02 NOTE — Telephone Encounter (Signed)
Patient returned your call for results °

## 2018-08-07 ENCOUNTER — Telehealth (HOSPITAL_COMMUNITY): Payer: Self-pay | Admitting: *Deleted

## 2018-08-07 NOTE — Telephone Encounter (Signed)
Patient given detailed instructions per Myocardial Perfusion Study Information Sheet for the test on 08/09/18 at 0745. Patient notified to arrive 15 minutes early and that it is imperative to arrive on time for appointment to keep from having the test rescheduled.  If you need to cancel or reschedule your appointment, please call the office within 24 hours of your appointment. . Patient verbalized understanding.Rena Hunke, Ranae Palms

## 2018-08-07 NOTE — Telephone Encounter (Signed)
Patient given detailed instructions per Myocardial Perfusion Study Information Sheet for the test on 08/09/18 at 0745. Patient notified to arrive 15 minutes early and that it is imperative to arrive on time for appointment to keep from having the test rescheduled.  If you need to cancel or reschedule your appointment, please call the office within 24 hours of your appointment. . Patient verbalized understanding.Miria Cappelli, Ranae Palms

## 2018-08-09 ENCOUNTER — Ambulatory Visit (HOSPITAL_COMMUNITY): Payer: Medicare Other | Attending: Cardiology

## 2018-08-09 DIAGNOSIS — I255 Ischemic cardiomyopathy: Secondary | ICD-10-CM | POA: Diagnosis present

## 2018-08-09 DIAGNOSIS — I252 Old myocardial infarction: Secondary | ICD-10-CM | POA: Insufficient documentation

## 2018-08-09 DIAGNOSIS — Z01810 Encounter for preprocedural cardiovascular examination: Secondary | ICD-10-CM | POA: Insufficient documentation

## 2018-08-09 DIAGNOSIS — I2510 Atherosclerotic heart disease of native coronary artery without angina pectoris: Secondary | ICD-10-CM

## 2018-08-09 DIAGNOSIS — I447 Left bundle-branch block, unspecified: Secondary | ICD-10-CM

## 2018-08-09 DIAGNOSIS — Z951 Presence of aortocoronary bypass graft: Secondary | ICD-10-CM

## 2018-08-09 LAB — MYOCARDIAL PERFUSION IMAGING
CHL CUP NUCLEAR SDS: 3
CSEPPHR: 77 {beats}/min
LV dias vol: 148 mL (ref 46–106)
LV sys vol: 98 mL
Rest HR: 63 {beats}/min
SRS: 21
SSS: 25
TID: 1.04

## 2018-08-09 MED ORDER — TECHNETIUM TC 99M TETROFOSMIN IV KIT
9.8000 | PACK | Freq: Once | INTRAVENOUS | Status: AC | PRN
  Administered 2018-08-09: 9.8 via INTRAVENOUS
  Filled 2018-08-09: qty 10

## 2018-08-09 MED ORDER — TECHNETIUM TC 99M TETROFOSMIN IV KIT
31.7000 | PACK | Freq: Once | INTRAVENOUS | Status: AC | PRN
  Administered 2018-08-09: 31.7 via INTRAVENOUS
  Filled 2018-08-09: qty 32

## 2018-08-09 MED ORDER — REGADENOSON 0.4 MG/5ML IV SOLN
0.4000 mg | Freq: Once | INTRAVENOUS | Status: AC
  Administered 2018-08-09: 0.4 mg via INTRAVENOUS

## 2018-08-11 NOTE — Pre-Procedure Instructions (Signed)
SVEA PUSCH  08/11/2018      [id number] - [place], [place] - [phone] [address] AT [address] [place] [address] Phone: [phone] Fax: [phone]    Your procedure is scheduled on December 13th.  Report to [facility] Admitting at 1000 A.M.  Call this number if you have problems the morning of surgery:  [phone]   Remember:  Do not eat after midnight.  You may drink clear liquids until 0900am .  Clear liquids allowed are:                    Water, Juice (non-citric and without pulp), Carbonated beverages, Clear Tea, Black Coffee only and Gatorade    Take these medicines the morning of surgery with A SIP OF WATER   amLODipine (NORVASC) dimenhyDRINATE (DRAMAMINE) ezetimibe (ZETIA) fluticasone (FLONASE) if needed loratadine (CLARITIN) if needed metoprolol succinate (TOPROL-XL)  Polyvinyl Alcohol-Povidone PF (REFRESH) if needed  Follow your surgeon's instructions for when to stop/resume your Aspirin. If no instructions were given to you, call your surgeon's office.  7 days prior to surgery STOP taking any Aspirin(unless otherwise instructed by your surgeon), Aleve, Naproxen, Ibuprofen, Motrin, Advil, Goody's, BC's, all herbal medications, fish oil, and all vitamins   Do not wear jewelry, make-up or nail polish.  Do not wear lotions, powders, or perfumes, or deodorant.  Do not shave 48 hours prior to surgery.  Men may shave face and neck.  Do not bring valuables to the hospital.  [facility] is not responsible for any belongings or valuables.  Contacts, dentures or bridgework may not be worn into surgery.  Leave your suitcase in the car.  After surgery it may be brought to your room.  For patients admitted to the hospital, discharge time will be determined by your treatment team.  Patients discharged the day of surgery will not be allowed to drive home.    [place]- Preparing For  Surgery  Before surgery, you can play an important role. Because skin is not sterile, your skin needs to be as free of germs as possible. You can reduce the number of germs on your skin by washing with CHG (chlorahexidine gluconate) Soap before surgery.  CHG is an antiseptic cleaner which kills germs and bonds with the skin to continue killing germs even after washing.    Oral Hygiene is also important to reduce your risk of infection.  Remember - BRUSH YOUR TEETH THE MORNING OF SURGERY WITH YOUR REGULAR TOOTHPASTE  Please do not use if you have an allergy to CHG or antibacterial soaps. If your skin becomes reddened/irritated stop using the CHG.  Do not shave (including legs and underarms) for at least 48 hours prior to first CHG shower. It is OK to shave your face.  Please follow these instructions carefully.   1. Shower the NIGHT BEFORE SURGERY and the MORNING OF SURGERY with CHG.   2. If you chose to wash your hair, wash your hair first as usual with your normal shampoo.  3. After you shampoo, rinse your hair and body thoroughly to remove the shampoo.  4. Use CHG as you would any other liquid soap. You can apply CHG directly to the skin and wash gently with a scrungie or a clean washcloth.   5. Apply the CHG Soap to your body ONLY FROM THE NECK DOWN.  Do not use on open wounds or open sores. Avoid contact  with your eyes, ears, mouth and genitals (private parts). Wash Face and genitals (private parts)  with your normal soap.  6. Wash thoroughly, paying special attention to the area where your surgery will be performed.  7. Thoroughly rinse your body with warm water from the neck down.  8. DO NOT shower/wash with your normal soap after using and rinsing off the CHG Soap.  9. Pat yourself dry with a CLEAN TOWEL.  10. Wear CLEAN PAJAMAS to bed the night before surgery, wear comfortable clothes the morning of surgery  11. Place CLEAN SHEETS on your bed the night of your first shower and  DO NOT SLEEP WITH PETS.    Day of Surgery:  Do not apply any deodorants/lotions.  Please wear clean clothes to the hospital/surgery center.   Remember to brush your teeth WITH YOUR REGULAR TOOTHPASTE.    Please read over the following fact sheets that you were given.

## 2018-08-14 ENCOUNTER — Telehealth: Payer: Self-pay | Admitting: Cardiology

## 2018-08-14 ENCOUNTER — Encounter (HOSPITAL_COMMUNITY): Payer: Self-pay

## 2018-08-14 ENCOUNTER — Encounter (HOSPITAL_COMMUNITY)
Admission: RE | Admit: 2018-08-14 | Discharge: 2018-08-14 | Disposition: A | Discharge status: Home / Self Care | Payer: Medicare Other | Source: Ambulatory Visit | Attending: General Surgery | Admitting: General Surgery

## 2018-08-14 ENCOUNTER — Other Ambulatory Visit: Payer: Self-pay

## 2018-08-14 DIAGNOSIS — Z01812 Encounter for preprocedural laboratory examination: Secondary | ICD-10-CM | POA: Diagnosis present

## 2018-08-14 HISTORY — DX: Malignant (primary) neoplasm, unspecified: C80.1

## 2018-08-14 HISTORY — DX: Anxiety disorder, unspecified: F41.9

## 2018-08-14 HISTORY — DX: Chronic kidney disease, unspecified: N18.9

## 2018-08-14 LAB — BASIC METABOLIC PANEL
ANION GAP: 11 (ref 5–15)
BUN: 18 mg/dL (ref 8–23)
CO2: 24 mmol/L (ref 22–32)
Calcium: 9.3 mg/dL (ref 8.9–10.3)
Chloride: 104 mmol/L (ref 98–111)
Creatinine, Ser: 1.1 mg/dL — ABNORMAL HIGH (ref 0.44–1.00)
GFR calc non Af Amer: 49 mL/min — ABNORMAL LOW (ref >60)
GFR, EST AFRICAN AMERICAN: 57 mL/min — AB (ref >60)
GLUCOSE: 82 mg/dL (ref 70–99)
Potassium: 3.6 mmol/L (ref 3.5–5.1)
Sodium: 139 mmol/L (ref 135–145)

## 2018-08-14 LAB — CBC
HCT: 42.2 % (ref 36.0–46.0)
Hemoglobin: 12.5 g/dL (ref 12.0–15.0)
MCH: 26.4 pg (ref 26.0–34.0)
MCHC: 29.6 g/dL — ABNORMAL LOW (ref 30.0–36.0)
MCV: 89.2 fL (ref 80.0–100.0)
Platelets: 235 10*3/uL (ref 150–400)
RBC: 4.73 MIL/uL (ref 3.87–5.11)
RDW: 14.7 % (ref 11.5–15.5)
WBC: 5.2 10*3/uL (ref 4.0–10.5)
nRBC: 0 % (ref 0.0–0.2)

## 2018-08-14 NOTE — Progress Notes (Signed)
PCP - Dr. Velna Hatchet Cardiologist - Dr. Wynonia Lawman  Chest x-ray - N/A EKG - 08/01/18- L bundle branch block Stress Test - 08/09/18 ECHO - 08/01/18 Cardiac Cath - 08/27/91  Sleep Study - denies  Aspirin Instructions: patient not given instructions on when to stop ASA. Left message for [facility] with Dr. Marlou Starks to contact patient with instructions.  Anesthesia review: yes- cardiac history  Patient denies shortness of breath, fever, cough and chest pain at PAT appointment   Patient verbalized understanding of instructions that were given to them at the PAT appointment. Patient was also instructed that they will need to review over the PAT instructions again at home before surgery.

## 2018-08-14 NOTE — Pre-Procedure Instructions (Signed)
DENEENE TARVER  08/14/2018      [id number] - [place], [place] - [phone] [address] AT [address] [place] [address] Phone: [phone] Fax: [phone]    Your procedure is scheduled on December 13th.  Report to [facility] Admitting at 1000 A.M.  Call this number if you have problems the morning of surgery:  [phone]   Remember:  Do not eat after midnight.  You may drink clear liquids until 0900am .  Clear liquids allowed are:                    Water, Juice (non-citric and without pulp), Carbonated beverages, Clear Tea, Black Coffee only and Gatorade    Take these medicines the morning of surgery with A SIP OF WATER  amLODipine (NORVASC) dimenhyDRINATE (DRAMAMINE) if needed fluticasone (FLONASE) if needed loratadine (CLARITIN) if needed metoprolol succinate (TOPROL-XL)  Polyvinyl Alcohol-Povidone PF (REFRESH) if needed  Follow your surgeon's instructions for when to stop/resume your Aspirin. If no instructions were given to you, call your surgeon's office.  7 days prior to surgery STOP taking any Aspirin(unless otherwise instructed by your surgeon), Aleve, Naproxen, Ibuprofen, Motrin, Advil, Goody's, BC's, all herbal medications, fish oil, and all vitamins   Do not wear jewelry, make-up or nail polish.  Do not wear lotions, powders, or perfumes, or deodorant.  Do not shave 48 hours prior to surgery.  Men may shave face and neck.  Do not bring valuables to the hospital.  [facility] is not responsible for any belongings or valuables.  Contacts, dentures or bridgework may not be worn into surgery.  Leave your suitcase in the car.  After surgery it may be brought to your room.  For patients admitted to the hospital, discharge time will be determined by your treatment team.  Patients discharged the day of surgery will not be allowed to drive home.    - Preparing For  Surgery  Before surgery, you can play an important role. Because skin is not sterile, your skin needs to be as free of germs as possible. You can reduce the number of germs on your skin by washing with CHG (chlorahexidine gluconate) Soap before surgery.  CHG is an antiseptic cleaner which kills germs and bonds with the skin to continue killing germs even after washing.    Oral Hygiene is also important to reduce your risk of infection.  Remember - BRUSH YOUR TEETH THE MORNING OF SURGERY WITH YOUR REGULAR TOOTHPASTE  Please do not use if you have an allergy to CHG or antibacterial soaps. If your skin becomes reddened/irritated stop using the CHG.  Do not shave (including legs and underarms) for at least 48 hours prior to first CHG shower. It is OK to shave your face.  Please follow these instructions carefully.   1. Shower the NIGHT BEFORE SURGERY and the MORNING OF SURGERY with CHG.   2. If you chose to wash your hair, wash your hair first as usual with your normal shampoo.  3. After you shampoo, rinse your hair and body thoroughly to remove the shampoo.  4. Use CHG as you would any other liquid soap. You can apply CHG directly to the skin and wash gently with a scrungie or a clean washcloth.   5. Apply the CHG Soap to your body ONLY FROM THE NECK DOWN.  Do not use on open wounds or open sores. Avoid contact with  your eyes, ears, mouth and genitals (private parts). Wash Face and genitals (private parts)  with your normal soap.  6. Wash thoroughly, paying special attention to the area where your surgery will be performed.  7. Thoroughly rinse your body with warm water from the neck down.  8. DO NOT shower/wash with your normal soap after using and rinsing off the CHG Soap.  9. Pat yourself dry with a CLEAN TOWEL.  10. Wear CLEAN PAJAMAS to bed the night before surgery, wear comfortable clothes the morning of surgery  11. Place CLEAN SHEETS on your bed the night of your first shower and  DO NOT SLEEP WITH PETS.    Day of Surgery:  Do not apply any deodorants/lotions.  Please wear clean clothes to the hospital/surgery center.   Remember to brush your teeth WITH YOUR REGULAR TOOTHPASTE.    Please read over the following fact sheets that you were given.

## 2018-08-14 NOTE — Telephone Encounter (Signed)
Alyssa Cardenas needs a call regarding Alyssa Cardenas cardiac clearance

## 2018-08-14 NOTE — Telephone Encounter (Signed)
Spoke with Sharyn Lull. She will fax clearance form to [facility] and Dr. Agustin Cree will review upon is return tomorrow. She verbally understands.

## 2018-08-15 NOTE — Progress Notes (Signed)
Anesthesia Chart Review:  Case:  [id number] Date/Time:  08/18/18 1145   Procedure:  LEFT BREAST LUMPECTOMY WITH RADIOACTIVE SEED LOCALIZATION (Left Breast)   Anesthesia type:  General   Pre-op diagnosis:  LEFT BREAST DUCTAL CARCINOMA IN SITU   Location:  [facility] OR ROOM 02 / [facility]   Surgeon:  Jovita Kussmaul, MD      DISCUSSION: 77 yo female former smoker. Pertinent hx includes CAD (remote hx of MI and CABG), HTN, CKD.  Pt previously followed with Dr. Wynonia Lawman. Due to his leave of absence she has established with Dr. Drue Dun for preop clearance. Echo 08/01/2018 showed EF 40-45%, hypokinesis of the anterior and atero septal wall, paradoxical intraventricular septal motion, LBBB, Mild - moderate MR. Lexiscan stress 08/01/2018 showed EF 34% with apical and peri-apical akinesis. Fixed, large, severe mid to apical anterolateral, anterior, and anteroseptal perfusion defect involving the apex and the apical inferior wall as well. This suggests a large area of infarction in the LAD territory without significant ischemia.  Pt has been cleared by Dr. Agustin Cree stating: " Silas Flood is an acceptable candidate for her upcoming procedure.  She will require careful hemodynamic monitoring due to diminished ejection fraction."   Anticipate she can proceed as planned barring acute status change.   VS: BP (!) 140/58   Pulse 98   Temp 36.7 C   Resp 18   Ht 5\' 2"  (1.575 m)   Wt 94.9 kg   SpO2 100%   BMI 38.26 kg/m   PROVIDERS: Velna Hatchet, MD is PCP  Jenne Campus, MD is Cardiologist   LABS: Labs reviewed: Acceptable for surgery. (all labs ordered are listed, but only abnormal results are displayed)  Labs Reviewed  CBC - Abnormal; Notable for the following components:      Result Value   MCHC 29.6 (*)    All other components within normal limits  BASIC METABOLIC PANEL - Abnormal; Notable for the following components:   Creatinine, Ser 1.10 (*)    GFR calc non Af Amer 49 (*)    GFR calc  Af Amer 57 (*)    All other components within normal limits     IMAGES: CHEST  2 VIEW 03/10/2015:  COMPARISON:  None  FINDINGS: Enlargement of cardiac silhouette post CABG.  Tortuous aorta.  Mediastinal contours and pulmonary vascularity normal.  Lungs clear.  No pleural effusion or pneumothorax.  No acute osseous findings.  IMPRESSION: No acute abnormalities.  EKG: 08/01/2018: NSR. Rate 68. LAD. LBBB.  CV: Lexiscan stress 08/09/2018:  Nuclear stress EF: 34%.  There was no ST segment deviation noted during stress.  Findings consistent with prior myocardial infarction.  This is an intermediate risk study.  The left ventricular ejection fraction is moderately decreased (30-44%).   1. EF 34% with apical and peri-apical akinesis.  2. Fixed, large, severe mid to apical anterolateral, anterior, and anteroseptal perfusion defect involving the apex and the apical inferior wall as well.  This suggests a large area of infarction in the LAD territory without significant ischemia.   Intermediate risk study with large scar and low EF.   TTE 08/01/2018: Study Conclusions  - Left ventricle: The cavity size was normal. Systolic function was   mildly to moderately reduced. The estimated ejection fraction was   in the range of 40% to 45%. Akinesis of the anteroseptal   myocardium. Hypokinesis of the anterior myocardium. Doppler   parameters are consistent with abnormal left ventricular   relaxation (grade 1 diastolic  dysfunction). - Ventricular septum: Septal motion showed mild paradox. - Mitral valve: There was mild to moderate regurgitation.  Impressions:  - Diminished LVEF - 40-45%   Hypokinesis of the anterior and atero septal wall.   Paradoxical intraventricular septal motion - LBBB   Mild - moderate MR.  Past Medical History:  Diagnosis Date  . Anxiety   . CAD (coronary artery disease), native coronary artery    Cath 08/27/1991 Normal Left main,  calcified proximal LAD, 50% stenosis proximal LAD, 90% stenosis mid LAD, occluded Diag 1, 95% stenosis proximal Diag 2, 95% stenosis proximal CFX, 95% stenosis proximal OM 1, occluded RCA;  CABG w LIMA to LAD, SVG to dx, SVG to OM1-2-circ, SVG to PDA 08/29/1991 Dr. Redmond Pulling    . Cancer ([place])   . Chronic kidney disease   . History of colonic polyps 03/10/2015  . 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 Hyperlipidemia   . Hypertensive heart disease    . Myocardial infarction ([place])   . Obesity (BMI 30-39.9)   . Vitamin D deficiency 03/10/2015    Past Surgical History:  Procedure Laterality Date  . BREAST BIOPSY    . BREAST EXCISIONAL BIOPSY Bilateral   . CORONARY ARTERY BYPASS GRAFT  1992  . EYE SURGERY     cataract removal 2017  . TONSILLECTOMY      MEDICATIONS: . amLODipine (NORVASC) 10 MG tablet  . aspirin EC 81 MG tablet  . dimenhyDRINATE (DRAMAMINE) 50 MG tablet  . ezetimibe (ZETIA) 10 MG tablet  . fluticasone (FLONASE) 50 MCG/ACT nasal spray  . Krill Oil 350 MG CAPS  . loratadine (CLARITIN) 10 MG tablet  . losartan-hydrochlorothiazide (HYZAAR) 100-25 MG per tablet  . metoprolol succinate (TOPROL-XL) 25 MG 24 hr tablet  . nitroGLYCERIN (NITROSTAT) 0.4 MG SL tablet  . Polyvinyl Alcohol-Povidone PF (REFRESH) 1.4-0.6 % SOLN  . potassium chloride (KLOR-CON) 20 MEQ packet  . rosuvastatin (CRESTOR) 40 MG tablet   No current facility-administered medications for this encounter.      Wynonia Musty [facility]/Anesthesiology Phone [phone] 08/17/2018 11:00 AM

## 2018-08-16 ENCOUNTER — Ambulatory Visit
Admission: RE | Admit: 2018-08-16 | Discharge: 2018-08-16 | Disposition: A | Discharge status: Home / Self Care | Payer: Medicare Other | Source: Ambulatory Visit | Attending: General Surgery | Admitting: General Surgery

## 2018-08-16 ENCOUNTER — Encounter: Payer: Self-pay | Admitting: Emergency Medicine

## 2018-08-16 ENCOUNTER — Telehealth: Payer: Self-pay | Admitting: Hematology and Oncology

## 2018-08-16 DIAGNOSIS — D0512 Intraductal carcinoma in situ of left breast: Secondary | ICD-10-CM

## 2018-08-16 NOTE — Telephone Encounter (Signed)
Patient's husband called back and informed she is cleared for surgery. He will inform patient.

## 2018-08-16 NOTE — Telephone Encounter (Signed)
Scheduled appt per 12/06 sch message - pt is aware of appt date and time

## 2018-08-16 NOTE — Telephone Encounter (Signed)
Faxed clearance to [facility] [place] [facility] this am. Sharyn Lull aware. Left message to inform patient this has been sent as well.

## 2018-08-17 NOTE — Anesthesia Preprocedure Evaluation (Addendum)
Anesthesia Evaluation  Patient identified by MRN, date of birth, ID band Patient awake    Reviewed: Allergy & Precautions, NPO status , Patient's Chart, lab work & pertinent test results  Airway Mallampati: I  TM Distance: >3 FB Neck ROM: Full    Dental  (+) Teeth Intact, Dental Advisory Given   Pulmonary former smoker,    Pulmonary exam normal breath sounds clear to auscultation       Cardiovascular + CAD, + Past MI and + CABG  Normal cardiovascular exam Rhythm:Regular Rate:Normal     Neuro/Psych Anxiety    GI/Hepatic   Endo/Other    Renal/GU      Musculoskeletal   Abdominal   Peds  Hematology   Anesthesia Other Findings   Reproductive/Obstetrics                           Anesthesia Physical Anesthesia Plan  ASA: III  Anesthesia Plan: General   Post-op Pain Management:    Induction: Intravenous  PONV Risk Score and Plan: 3 and Midazolam, Dexamethasone and Ondansetron  Airway Management Planned: LMA  Additional Equipment:   Intra-op Plan:   Post-operative Plan: Extubation in OR  Informed Consent: I have reviewed the patients History and Physical, chart, labs and discussed the procedure including the risks, benefits and alternatives for the proposed anesthesia with the patient or authorized representative who has indicated his/her understanding and acceptance.     Plan Discussed with: CRNA and Surgeon  Anesthesia Plan Comments: (See PAT note 08/14/2018 by Karoline Caldwell, PA-C )       Anesthesia Quick Evaluation

## 2018-08-18 ENCOUNTER — Ambulatory Visit
Admission: RE | Admit: 2018-08-18 | Discharge: 2018-08-18 | Disposition: A | Discharge status: Home / Self Care | Payer: Medicare Other | Source: Ambulatory Visit | Attending: General Surgery | Admitting: General Surgery

## 2018-08-18 ENCOUNTER — Ambulatory Visit (HOSPITAL_COMMUNITY)
Admission: RE | Admit: 2018-08-18 | Discharge: 2018-08-18 | Disposition: A | Discharge status: Home / Self Care | Payer: Medicare Other | Attending: General Surgery | Admitting: General Surgery

## 2018-08-18 ENCOUNTER — Ambulatory Visit (HOSPITAL_COMMUNITY): Payer: Medicare Other | Admitting: Anesthesiology

## 2018-08-18 ENCOUNTER — Other Ambulatory Visit: Payer: Self-pay

## 2018-08-18 ENCOUNTER — Encounter (HOSPITAL_COMMUNITY)
Admission: RE | Disposition: A | Discharge status: Home / Self Care | Payer: Self-pay | Source: Home / Self Care | Attending: General Surgery

## 2018-08-18 ENCOUNTER — Ambulatory Visit (HOSPITAL_COMMUNITY): Payer: Medicare Other | Admitting: Physician Assistant

## 2018-08-18 ENCOUNTER — Encounter (HOSPITAL_COMMUNITY): Payer: Self-pay | Admitting: Surgery

## 2018-08-18 DIAGNOSIS — Z951 Presence of aortocoronary bypass graft: Secondary | ICD-10-CM | POA: Diagnosis not present

## 2018-08-18 DIAGNOSIS — I4891 Unspecified atrial fibrillation: Secondary | ICD-10-CM | POA: Diagnosis not present

## 2018-08-18 DIAGNOSIS — I252 Old myocardial infarction: Secondary | ICD-10-CM | POA: Diagnosis not present

## 2018-08-18 DIAGNOSIS — Z87891 Personal history of nicotine dependence: Secondary | ICD-10-CM | POA: Diagnosis not present

## 2018-08-18 DIAGNOSIS — N189 Chronic kidney disease, unspecified: Secondary | ICD-10-CM | POA: Diagnosis not present

## 2018-08-18 DIAGNOSIS — D0512 Intraductal carcinoma in situ of left breast: Secondary | ICD-10-CM

## 2018-08-18 DIAGNOSIS — Z170 Estrogen receptor positive status [ER+]: Secondary | ICD-10-CM | POA: Diagnosis not present

## 2018-08-18 DIAGNOSIS — I2510 Atherosclerotic heart disease of native coronary artery without angina pectoris: Secondary | ICD-10-CM | POA: Diagnosis not present

## 2018-08-18 HISTORY — PX: BREAST LUMPECTOMY WITH RADIOACTIVE SEED LOCALIZATION: SHX6424

## 2018-08-18 SURGERY — BREAST LUMPECTOMY WITH RADIOACTIVE SEED LOCALIZATION
Anesthesia: General | Site: Breast | Laterality: Left

## 2018-08-18 MED ORDER — CHLORHEXIDINE GLUCONATE CLOTH 2 % EX PADS: 6.0000 | MEDICATED_PAD | Freq: Once | CUTANEOUS | Status: DC

## 2018-08-18 MED ORDER — ACETAMINOPHEN 500 MG PO TABS
ORAL_TABLET | ORAL | Status: AC
  Administered 2018-08-18: 1000 mg via ORAL
  Filled 2018-08-18: qty 2

## 2018-08-18 MED ORDER — FENTANYL CITRATE (PF) 100 MCG/2ML IJ SOLN
INTRAMUSCULAR | Status: DC | PRN
  Administered 2018-08-18: 50 ug via INTRAVENOUS

## 2018-08-18 MED ORDER — GABAPENTIN 300 MG PO CAPS
300.0000 mg | ORAL_CAPSULE | ORAL | Status: AC
  Administered 2018-08-18: 300 mg via ORAL

## 2018-08-18 MED ORDER — ONDANSETRON HCL 4 MG/2ML IJ SOLN: 4.0000 mg | Freq: Once | INTRAMUSCULAR | Status: DC | PRN

## 2018-08-18 MED ORDER — PROPOFOL 10 MG/ML IV BOLUS
INTRAVENOUS | Status: DC | PRN
  Administered 2018-08-18: 120 mg via INTRAVENOUS

## 2018-08-18 MED ORDER — LACTATED RINGERS IV SOLN
INTRAVENOUS | Status: DC | PRN
  Administered 2018-08-18: 10:00:00 via INTRAVENOUS

## 2018-08-18 MED ORDER — FENTANYL CITRATE (PF) 250 MCG/5ML IJ SOLN
INTRAMUSCULAR | Status: AC
  Filled 2018-08-18: qty 5

## 2018-08-18 MED ORDER — BUPIVACAINE-EPINEPHRINE 0.25% -1:200000 IJ SOLN
INTRAMUSCULAR | Status: DC | PRN
  Administered 2018-08-18: 20 mL

## 2018-08-18 MED ORDER — ONDANSETRON HCL 4 MG/2ML IJ SOLN
INTRAMUSCULAR | Status: AC
  Filled 2018-08-18: qty 2

## 2018-08-18 MED ORDER — MIDAZOLAM HCL 5 MG/5ML IJ SOLN
INTRAMUSCULAR | Status: DC | PRN
  Administered 2018-08-18: 1 mg via INTRAVENOUS

## 2018-08-18 MED ORDER — CEFAZOLIN SODIUM-DEXTROSE 2-4 GM/100ML-% IV SOLN
2.0000 g | INTRAVENOUS | Status: AC
  Administered 2018-08-18: 2 g via INTRAVENOUS

## 2018-08-18 MED ORDER — EPHEDRINE 5 MG/ML INJ
INTRAVENOUS | Status: AC
  Filled 2018-08-18: qty 10

## 2018-08-18 MED ORDER — GABAPENTIN 300 MG PO CAPS
ORAL_CAPSULE | ORAL | Status: AC
  Administered 2018-08-18: 300 mg via ORAL
  Filled 2018-08-18: qty 1

## 2018-08-18 MED ORDER — DEXAMETHASONE SODIUM PHOSPHATE 10 MG/ML IJ SOLN
INTRAMUSCULAR | Status: AC
  Filled 2018-08-18: qty 1

## 2018-08-18 MED ORDER — CELECOXIB 200 MG PO CAPS
ORAL_CAPSULE | ORAL | Status: AC
  Administered 2018-08-18: 200 mg via ORAL
  Filled 2018-08-18: qty 1

## 2018-08-18 MED ORDER — ONDANSETRON HCL 4 MG/2ML IJ SOLN
INTRAMUSCULAR | Status: DC | PRN
  Administered 2018-08-18: 4 mg via INTRAVENOUS

## 2018-08-18 MED ORDER — CELECOXIB 200 MG PO CAPS
200.0000 mg | ORAL_CAPSULE | ORAL | Status: AC
  Administered 2018-08-18: 200 mg via ORAL

## 2018-08-18 MED ORDER — MEPERIDINE HCL 50 MG/ML IJ SOLN: 6.2500 mg | INTRAMUSCULAR | Status: DC | PRN

## 2018-08-18 MED ORDER — ACETAMINOPHEN 500 MG PO TABS
1000.0000 mg | ORAL_TABLET | ORAL | Status: AC
  Administered 2018-08-18: 1000 mg via ORAL

## 2018-08-18 MED ORDER — HYDROMORPHONE HCL 1 MG/ML IJ SOLN: 0.2500 mg | INTRAMUSCULAR | Status: DC | PRN

## 2018-08-18 MED ORDER — PROPOFOL 10 MG/ML IV BOLUS
INTRAVENOUS | Status: AC
  Filled 2018-08-18: qty 20

## 2018-08-18 MED ORDER — LIDOCAINE 2% (20 MG/ML) 5 ML SYRINGE
INTRAMUSCULAR | Status: AC
  Filled 2018-08-18: qty 5

## 2018-08-18 MED ORDER — SODIUM CHLORIDE 0.9 % IV SOLN
INTRAVENOUS | Status: DC | PRN
  Administered 2018-08-18: 25 ug/min via INTRAVENOUS

## 2018-08-18 MED ORDER — 0.9 % SODIUM CHLORIDE (POUR BTL) OPTIME
TOPICAL | Status: DC | PRN
  Administered 2018-08-18: 1000 mL

## 2018-08-18 MED ORDER — FENTANYL CITRATE (PF) 100 MCG/2ML IJ SOLN: INTRAMUSCULAR | Status: DC | PRN

## 2018-08-18 MED ORDER — LIDOCAINE HCL (CARDIAC) PF 100 MG/5ML IV SOSY
PREFILLED_SYRINGE | INTRAVENOUS | Status: DC | PRN
  Administered 2018-08-18: 100 mg via INTRAVENOUS

## 2018-08-18 MED ORDER — EPHEDRINE SULFATE-NACL 50-0.9 MG/10ML-% IV SOSY
PREFILLED_SYRINGE | INTRAVENOUS | Status: DC | PRN
  Administered 2018-08-18 (×2): 10 mg via INTRAVENOUS

## 2018-08-18 MED ORDER — DEXAMETHASONE SODIUM PHOSPHATE 10 MG/ML IJ SOLN
INTRAMUSCULAR | Status: DC | PRN
  Administered 2018-08-18: 10 mg via INTRAVENOUS

## 2018-08-18 MED ORDER — MIDAZOLAM HCL 2 MG/2ML IJ SOLN
INTRAMUSCULAR | Status: AC
  Filled 2018-08-18: qty 2

## 2018-08-18 MED ORDER — HYDROCODONE-ACETAMINOPHEN 5-325 MG PO TABS: 1.0000 | ORAL_TABLET | Freq: Four times a day (QID) | ORAL | 0 refills | Status: DC | PRN

## 2018-08-18 SURGICAL SUPPLY — 43 items
ADH SKN CLS APL DERMABOND .7 (GAUZE/BANDAGES/DRESSINGS) ×1
APPLIER CLIP 9.375 MED OPEN (MISCELLANEOUS) ×3
APR CLP MED 9.3 20 MLT OPN (MISCELLANEOUS) ×1
BINDER BREAST LRG (GAUZE/BANDAGES/DRESSINGS) IMPLANT
BINDER BREAST XLRG (GAUZE/BANDAGES/DRESSINGS) IMPLANT
BLADE SURG 15 STRL LF DISP TIS (BLADE) ×1 IMPLANT
BLADE SURG 15 STRL SS (BLADE) ×3
CANISTER SUCT 3000ML PPV (MISCELLANEOUS) ×3 IMPLANT
CHLORAPREP W/TINT 26ML (MISCELLANEOUS) ×3 IMPLANT
CLIP APPLIE 9.375 MED OPEN (MISCELLANEOUS) IMPLANT
COVER PROBE W GEL 5X96 (DRAPES) ×3 IMPLANT
COVER SURGICAL LIGHT HANDLE (MISCELLANEOUS) ×3 IMPLANT
COVER WAND RF STERILE (DRAPES) ×1 IMPLANT
DERMABOND ADVANCED (GAUZE/BANDAGES/DRESSINGS) ×2
DERMABOND ADVANCED .7 DNX12 (GAUZE/BANDAGES/DRESSINGS) ×1 IMPLANT
DEVICE DUBIN SPECIMEN MAMMOGRA (MISCELLANEOUS) ×3 IMPLANT
DRAPE CHEST BREAST 15X10 FENES (DRAPES) ×3 IMPLANT
DRAPE UTILITY XL STRL (DRAPES) ×3 IMPLANT
ELECT COATED BLADE 2.86 ST (ELECTRODE) ×3 IMPLANT
ELECT REM PT RETURN 9FT ADLT (ELECTROSURGICAL) ×3
ELECTRODE REM PT RTRN 9FT ADLT (ELECTROSURGICAL) ×1 IMPLANT
GLOVE BIO SURGEON STRL SZ7.5 (GLOVE) ×6 IMPLANT
GOWN STRL REUS W/ TWL LRG LVL3 (GOWN DISPOSABLE) ×2 IMPLANT
GOWN STRL REUS W/TWL LRG LVL3 (GOWN DISPOSABLE) ×6
KIT BASIN OR (CUSTOM PROCEDURE TRAY) ×3 IMPLANT
KIT MARKER MARGIN INK (KITS) ×3 IMPLANT
LIGHT WAVEGUIDE WIDE FLAT (MISCELLANEOUS) IMPLANT
NDL HYPO 25GX1X1/2 BEV (NEEDLE) ×1 IMPLANT
NEEDLE HYPO 25GX1X1/2 BEV (NEEDLE) ×3 IMPLANT
NS IRRIG 1000ML POUR BTL (IV SOLUTION) ×3 IMPLANT
PACK SURGICAL SETUP 50X90 (CUSTOM PROCEDURE TRAY) ×3 IMPLANT
PENCIL BUTTON HOLSTER BLD 10FT (ELECTRODE) ×3 IMPLANT
SPONGE LAP 18X18 X RAY DECT (DISPOSABLE) ×3 IMPLANT
SUT MNCRL AB 4-0 PS2 18 (SUTURE) ×3 IMPLANT
SUT SILK 2 0 SH (SUTURE) IMPLANT
SUT VIC AB 3-0 SH 18 (SUTURE) ×3 IMPLANT
SYR BULB 3OZ (MISCELLANEOUS) ×3 IMPLANT
SYR CONTROL 10ML LL (SYRINGE) ×3 IMPLANT
TOWEL OR 17X24 6PK STRL BLUE (TOWEL DISPOSABLE) ×3 IMPLANT
TOWEL OR 17X26 10 PK STRL BLUE (TOWEL DISPOSABLE) ×3 IMPLANT
TUBE CONNECTING 12'X1/4 (SUCTIONS) ×1
TUBE CONNECTING 12X1/4 (SUCTIONS) ×2 IMPLANT
YANKAUER SUCT BULB TIP NO VENT (SUCTIONS) ×3 IMPLANT

## 2018-08-18 NOTE — H&P (Signed)
Alyssa Cardenas  Location: [facility] Patient #: [id number] DOB: [birth year] Undefined / Language: Cleophus Molt / Race: Black or African American Female   History of Present Illness The patient is a 77 year old female who presents with breast cancer. We are asked to see the patient in consultation by Dr. Sondra Come to evaluate her for a new left breast cancer. The patient is a 77 year old black female who presents with a screen detected mass in the LIQ of the left breast. this measured 1.6cm. this was biopsied and came back as an intermediate grade DCIS that was ER and PR +. She denies any breast pain or nipple discharge. She quit smoking about 40 years ago. She does have a h/o 6 vessel bypass and is followed by Dr. Wynonia Lawman   Past Surgical History Breast Biopsy  Bilateral. multiple Cataract Surgery  Bilateral.  Diagnostic Studies History  Pap Smear  >5 years ago  Medication History Medications Reconciled  Social History  Alcohol use  Remotely quit alcohol use. Caffeine use  Carbonated beverages, Coffee, Tea. Tobacco use  Former smoker.  Family History  Heart Disease  Brother, Mother. Hypertension  Brother, Father, Mother. Respiratory Condition  Father. Seizure disorder  Brother.  Pregnancy / Birth History  Age at menarche  72 years. Contraceptive History  Intrauterine device, Oral contraceptives. Gravida  2 Maternal age  55-30 Para  2 Regular periods   Other Problems  Arthritis  Atrial Fibrillation  Back Pain  Chest pain  Chronic Renal Failure Syndrome  Diverticulosis  Hemorrhoids  Hypercholesterolemia  Transfusion history     Review of Systems  General Not Present- Appetite Loss, Chills, Fatigue, Fever, Night Sweats, Weight Gain and Weight Loss. Skin Not Present- Change in Wart/Mole, Dryness, Hives, Jaundice, New Lesions, Non-Healing Wounds, Rash and Ulcer. HEENT Present- Hearing Loss, Seasonal Allergies and Wears glasses/contact  lenses. Not Present- Earache, Hoarseness, Nose Bleed, Oral Ulcers, Ringing in the Ears, Sinus Pain, Sore Throat, Visual Disturbances and Yellow Eyes. Respiratory Present- Snoring. Not Present- Bloody sputum, Chronic Cough, Difficulty Breathing and Wheezing. Breast Not Present- Breast Mass, Breast Pain, Nipple Discharge and Skin Changes. Cardiovascular Present- Swelling of Extremities. Not Present- Chest Pain, Difficulty Breathing Lying Down, Leg Cramps, Palpitations, Rapid Heart Rate and Shortness of Breath. Gastrointestinal Present- Hemorrhoids. Not Present- Abdominal Pain, Bloating, Bloody Stool, Change in Bowel Habits, Chronic diarrhea, Constipation, Difficulty Swallowing, Excessive gas, Gets full quickly at meals, Indigestion, Nausea, Rectal Pain and Vomiting. Female Genitourinary Present- Nocturia. Not Present- Frequency, Painful Urination, Pelvic Pain and Urgency. Musculoskeletal Not Present- Back Pain, Joint Pain, Joint Stiffness, Muscle Pain, Muscle Weakness and Swelling of Extremities. Neurological Not Present- Decreased Memory, Fainting, Headaches, Numbness, Seizures, Tingling, Tremor, Trouble walking and Weakness. Psychiatric Not Present- Anxiety, Bipolar, Change in Sleep Pattern, Depression, Fearful and Frequent crying. Endocrine Not Present- Cold Intolerance, Excessive Hunger, Hair Changes, Heat Intolerance, Hot flashes and New Diabetes. Hematology Present- Blood Thinners. Not Present- Easy Bruising, Excessive bleeding, Gland problems, HIV and Persistent Infections.   Physical Exam General Mental Status-Alert. General Appearance-Consistent with stated age. Hydration-Well hydrated. Voice-Normal.  Head and Neck Head-normocephalic, atraumatic with no lesions or palpable masses. Trachea-midline. Thyroid Gland Characteristics - normal size and consistency.  Eye Eyeball - Bilateral-Extraocular movements intact. Sclera/Conjunctiva - Bilateral-No scleral  icterus.  Chest and Lung Exam Chest and lung exam reveals -quiet, even and easy respiratory effort with no use of accessory muscles and on auscultation, normal breath sounds, no adventitious sounds and normal vocal resonance. Inspection Chest Wall -  Normal. Back - normal.  Breast Note: There is no palpable mass in either breast. there is no palpable axillary, supraclavicular, or cervical lymphadenopathy   Cardiovascular Cardiovascular examination reveals -normal heart sounds, regular rate and rhythm with no murmurs and normal pedal pulses bilaterally.  Abdomen Inspection Inspection of the abdomen reveals - No Hernias. Skin - Scar - no surgical scars. Palpation/Percussion Palpation and Percussion of the abdomen reveal - Soft, Non Tender, No Rebound tenderness, No Rigidity (guarding) and No hepatosplenomegaly. Auscultation Auscultation of the abdomen reveals - Bowel sounds normal.  Neurologic Neurologic evaluation reveals -alert and oriented x 3 with no impairment of recent or remote memory. Mental Status-Normal.  Musculoskeletal Normal Exam - Left-Upper Extremity Strength Normal and Lower Extremity Strength Normal. Normal Exam - Right-Upper Extremity Strength Normal and Lower Extremity Strength Normal.  Lymphatic Head & Neck  General Head & Neck Lymphatics: Bilateral - Description - Normal. Axillary  General Axillary Region: Bilateral - Description - Normal. Tenderness - Non Tender. Femoral & Inguinal  Generalized Femoral & Inguinal Lymphatics: Bilateral - Description - Normal. Tenderness - Non Tender.    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 I have discussed with her the different options for treatment and at this point she favors breast conservation. I think this is a very reasonable way of treating her cancer. I will plan for a  left breast radioactive seed localized lumpectomy. She will need cardiac clearance prior to scheduling

## 2018-08-18 NOTE — Op Note (Signed)
08/18/2018  12:09 PM  PATIENT:  Alyssa Cardenas  76 y.o. female  PRE-OPERATIVE DIAGNOSIS:  LEFT BREAST DUCTAL CARCINOMA IN SITU  POST-OPERATIVE DIAGNOSIS:  LEFT BREAST DUCTAL CARCINOMA IN SITU  PROCEDURE:  Procedure(s): LEFT BREAST LUMPECTOMY WITH RADIOACTIVE SEED LOCALIZATION (Left)  SURGEON:  Surgeon(s) and Role:    * Jovita Kussmaul, MD - Primary  PHYSICIAN ASSISTANT:   ASSISTANTS: none   ANESTHESIA:   local and general  EBL:  15 mL   BLOOD ADMINISTERED:none  DRAINS: none   LOCAL MEDICATIONS USED:  MARCAINE     SPECIMEN:  Source of Specimen:  left breast tissue  DISPOSITION OF SPECIMEN:  PATHOLOGY  COUNTS:  YES  TOURNIQUET:  * No tourniquets in log *  DICTATION: .Dragon Dictation   After informed consent was obtained the patient was brought to the operating room and placed in the supine position on the operating table.  After adequate induction of general anesthesia the patient's left breast was prepped with ChloraPrep, allowed to dry, and draped in usual sterile manner.  An appropriate timeout was performed.  Previously an I-125 seed was placed in the upper inner quadrant of the left breast to mark an area of ductal carcinoma in situ.  The neoprobe was set to I-125 in the area of radioactivity was readily identified.  She had a very large breast and the radioactive seed was nowhere near any location where we could try to hide the incision.  Because of this I elected to make an elliptical incision overlying the area of radioactivity with a 15 blade knife.  The incision was carried through the skin and subcutaneous tissue sharply with the electrocautery.  A circular portion of breast tissue was then excised sharply around the radioactive seed while checking the area of radioactivity frequently.  Once the specimen was removed it was oriented with the appropriate paint colors.  A specimen radiograph was obtained that showed the clip and seed to be near the center of the  specimen.  The specimen was then sent to pathology for further evaluation.  Hemostasis was achieved using the Bovie electrocautery.  The cavity was marked with clips.  The wound was irrigated with saline and infiltrated with more quarter percent Marcaine.  The deep layer of the wound was then closed with layers of interrupted 3-0 Vicryl stitches.  The skin was closed with a running 4-0 Monocryl subcuticular stitch.  Dermabond dressings were applied.  The patient tolerated the procedure well.  At the end of the case all needle sponge and instrument counts were correct.  The patient was then awakened and taken to recovery in stable condition.  PLAN OF CARE: Discharge to home after PACU  PATIENT DISPOSITION:  PACU - hemodynamically stable.   Delay start of Pharmacological VTE agent (>24hrs) due to surgical blood loss or risk of bleeding: not applicable

## 2018-08-18 NOTE — Anesthesia Procedure Notes (Signed)
Procedure Name: LMA Insertion Date/Time: 08/18/2018 11:22 AM Performed by: Jenne Campus, CRNA Pre-anesthesia Checklist: Patient identified, Emergency Drugs available, Suction available and Patient being monitored Patient Re-evaluated:Patient Re-evaluated prior to induction Oxygen Delivery Method: Circle System Utilized Preoxygenation: Pre-oxygenation with 100% oxygen Induction Type: IV induction Ventilation: Mask ventilation without difficulty LMA: LMA inserted LMA Size: 4.0 Number of attempts: 1 Airway Equipment and Method: Stylet and Oral airway Placement Confirmation: positive ETCO2 and breath sounds checked- equal and bilateral Tube secured with: Tape Dental Injury: Teeth and Oropharynx as per pre-operative assessment

## 2018-08-18 NOTE — Transfer of Care (Signed)
Immediate Anesthesia Transfer of Care Note  Patient: Alyssa Cardenas  Procedure(s) Performed: LEFT BREAST LUMPECTOMY WITH RADIOACTIVE SEED LOCALIZATION (Left Breast)  Patient Location: PACU  Anesthesia Type:General  Level of Consciousness: drowsy and patient cooperative  Airway & Oxygen Therapy: Patient Spontanous Breathing and Patient connected to nasal cannula oxygen  Post-op Assessment: Report given to RN, Post -op Vital signs reviewed and stable and Patient moving all extremities  Post vital signs: Reviewed and stable  Last Vitals:  Vitals Value Taken Time  BP    Temp    Pulse 68 08/18/2018 12:16 PM  Resp 20 08/18/2018 12:16 PM  SpO2 100 % 08/18/2018 12:16 PM  Vitals shown include unvalidated device data.  Last Pain:  Vitals:   08/18/18 0955  TempSrc:   PainSc: 0-No pain      Patients Stated Pain Goal: 3 (81/15/72 6203)  Complications: No apparent anesthesia complications

## 2018-08-18 NOTE — Anesthesia Postprocedure Evaluation (Signed)
Anesthesia Post Note  Patient: Alyssa Cardenas  Procedure(s) Performed: LEFT BREAST LUMPECTOMY WITH RADIOACTIVE SEED LOCALIZATION (Left Breast)     Patient location during evaluation: PACU Anesthesia Type: General Level of consciousness: awake and alert Pain management: pain level controlled Vital Signs Assessment: post-procedure vital signs reviewed and stable Respiratory status: spontaneous breathing, nonlabored ventilation, respiratory function stable and patient connected to nasal cannula oxygen Cardiovascular status: blood pressure returned to baseline and stable Postop Assessment: no apparent nausea or vomiting Anesthetic complications: no    Last Vitals:  Vitals:   08/18/18 1217 08/18/18 1245  BP: (!) 113/48 118/63  Pulse: 67 61  Resp: 13 (!) 9  Temp: (!) 36.2 C (!) 36.2 C  SpO2: 100% 95%    Last Pain:  Vitals:   08/18/18 1245  TempSrc:   PainSc: 0-No pain                 Reginald Weida DAVID

## 2018-08-18 NOTE — Interval H&P Note (Signed)
History and Physical Interval Note:  08/18/2018 10:49 AM  Alyssa Cardenas  has presented today for surgery, with the diagnosis of LEFT BREAST DUCTAL CARCINOMA IN SITU  The various methods of treatment have been discussed with the patient and family. After consideration of risks, benefits and other options for treatment, the patient has consented to  Procedure(s): LEFT BREAST LUMPECTOMY WITH RADIOACTIVE SEED LOCALIZATION (Left) as a surgical intervention .  The patient's history has been reviewed, patient examined, no change in status, stable for surgery.  I have reviewed the patient's chart and labs.  Questions were answered to the patient's satisfaction.     Autumn Messing III

## 2018-08-19 ENCOUNTER — Encounter (HOSPITAL_COMMUNITY): Payer: Self-pay | Admitting: General Surgery

## 2018-09-01 ENCOUNTER — Telehealth: Payer: Self-pay | Admitting: Hematology and Oncology

## 2018-09-01 ENCOUNTER — Inpatient Hospital Stay: Payer: Medicare Other | Attending: Hematology and Oncology | Admitting: Hematology and Oncology

## 2018-09-01 DIAGNOSIS — Z7982 Long term (current) use of aspirin: Secondary | ICD-10-CM | POA: Diagnosis not present

## 2018-09-01 DIAGNOSIS — Z79810 Long term (current) use of selective estrogen receptor modulators (SERMs): Secondary | ICD-10-CM | POA: Insufficient documentation

## 2018-09-01 DIAGNOSIS — Z9889 Other specified postprocedural states: Secondary | ICD-10-CM | POA: Insufficient documentation

## 2018-09-01 DIAGNOSIS — Z79899 Other long term (current) drug therapy: Secondary | ICD-10-CM | POA: Insufficient documentation

## 2018-09-01 DIAGNOSIS — D0512 Intraductal carcinoma in situ of left breast: Secondary | ICD-10-CM

## 2018-09-01 DIAGNOSIS — Z170 Estrogen receptor positive status [ER+]: Secondary | ICD-10-CM | POA: Diagnosis not present

## 2018-09-01 MED ORDER — TAMOXIFEN CITRATE 20 MG PO TABS: 20.0000 mg | ORAL_TABLET | Freq: Every day | ORAL | 3 refills | Status: DC

## 2018-09-01 NOTE — Assessment & Plan Note (Addendum)
08/18/2018: Left lumpectomy: Grade 2 DCIS, 2.3 cm, margins negative, ER 100%, PR 90%, no evidence of necrosis Tis NX stage 0  Pathology counseling: I discussed the final pathology report of the patient provided  a copy of this report. I discussed the margins as well as lymph node surgeries. We also discussed the final staging along with previously performed ER/PR testing.  Recommendation: 1.  Radiation oncology felt that there may not be as much benefit for adjuvant radiation. 2.  Plan: Tamoxifen for 5 years  Tamoxifen counseling:We discussed the risks and benefits of tamoxifen. These include but not limited to insomnia, hot flashes, mood changes, vaginal dryness, and weight gain. Although rare, serious side effects including endometrial cancer, risk of blood clots were also discussed. We strongly believe that the benefits far outweigh the risks. Patient understands these risks and consented to starting treatment. Planned treatment duration is 5 years.  Return to clinic in 3 months for survivorship care plan visit.

## 2018-09-01 NOTE — Progress Notes (Signed)
Patient Care Team: Velna Hatchet, MD as PCP - General (Internal Medicine) Jacolyn Reedy, MD as Consulting Physician (Cardiology) Jovita Kussmaul, MD as Consulting Physician (General Surgery) Nicholas Lose, MD as Consulting Physician (Hematology and Oncology) Gery Pray, MD as Consulting Physician (Radiation Oncology)  DIAGNOSIS:  Encounter Diagnosis  Name Primary?  . Ductal carcinoma in situ (DCIS) of left breast     SUMMARY OF ONCOLOGIC HISTORY:   Ductal carcinoma in situ (DCIS) of left breast   07/17/2018 Initial Diagnosis    Screening detected left breast mass 11 o'clock position 1.6 cm with irregular margins axilla negative biopsy revealed intermediate grade DCIS ER 100%, PR 90%, Tis NX stage 0    08/18/2018 Surgery    Left lumpectomy: Grade 2 DCIS, 2.3 cm, margins negative, ER 100%, PR 90%, no evidence of necrosis Tis NX stage 0     CHIEF COMPLIANT: Follow-up after recent left lumpectomy  INTERVAL HISTORY: Alyssa Cardenas is a 77-year with above-mentioned history of left breast DCIS who underwent lumpectomy and is here today to discuss the pathology report.  She is healing and recovering very well from recent surgery.  She has not required any pain medication.  REVIEW OF SYSTEMS:   Constitutional: Denies fevers, chills or abnormal weight loss Eyes: Denies blurriness of vision Ears, nose, mouth, throat, and face: Denies mucositis or sore throat Respiratory: Denies cough, dyspnea or wheezes Cardiovascular: Denies palpitation, chest discomfort Gastrointestinal:  Denies nausea, heartburn or change in bowel habits Skin: Denies abnormal skin rashes Lymphatics: Denies new lymphadenopathy or easy bruising Neurological:Denies numbness, tingling or new weaknesses Behavioral/Psych: Mood is stable, no new changes  Extremities: No lower extremity edema  All other systems were reviewed with the patient and are negative.  I have reviewed the past medical history, past  surgical history, social history and family history with the patient and they are unchanged from previous note.  ALLERGIES:  has No Known Allergies.  MEDICATIONS:  Current Outpatient Medications  Medication Sig Dispense Refill  . amLODipine (NORVASC) 10 MG tablet Take 10 mg by mouth daily.     Marland Kitchen aspirin EC 81 MG tablet Take 81 mg by mouth daily.    Marland Kitchen dimenhyDRINATE (DRAMAMINE) 50 MG tablet Take 50 mg by mouth daily as needed for dizziness.     . ezetimibe (ZETIA) 10 MG tablet Take 1 tablet (10 mg total) by mouth daily. 30 tablet 10  . fluticasone (FLONASE) 50 MCG/ACT nasal spray Place 2 sprays into both nostrils daily as needed for allergies or rhinitis.    Marland Kitchen HYDROcodone-acetaminophen (NORCO/VICODIN) 5-325 MG tablet Take 1-2 tablets by mouth every 6 (six) hours as needed for moderate pain or severe pain. 15 tablet 0  . Krill Oil 350 MG CAPS Take 350 mg by mouth daily.    Marland Kitchen loratadine (CLARITIN) 10 MG tablet Take 10 mg by mouth daily as needed for allergies.     Marland Kitchen losartan-hydrochlorothiazide (HYZAAR) 100-25 MG per tablet Take 0.5 tablets by mouth daily.     . metoprolol succinate (TOPROL-XL) 25 MG 24 hr tablet Take 25 mg by mouth daily.    . nitroGLYCERIN (NITROSTAT) 0.4 MG SL tablet Place 0.4 mg under the tongue every 5 (five) minutes as needed for chest pain.    . Polyvinyl Alcohol-Povidone PF (REFRESH) 1.4-0.6 % SOLN Place 2 drops into both eyes daily as needed (for dry eyes).    . potassium chloride (KLOR-CON) 20 MEQ packet Take 20 mEq by mouth daily.     Marland Kitchen  rosuvastatin (CRESTOR) 40 MG tablet Take 40 mg by mouth every evening.      No current facility-administered medications for this visit.     PHYSICAL EXAMINATION: ECOG PERFORMANCE STATUS: 1 - Symptomatic but completely ambulatory  Vitals:   09/01/18 0946  BP: 128/64  Pulse: 68  Resp: 19  Temp: (!) 97.4 F (36.3 C)  SpO2: 95%   Filed Weights   09/01/18 0946  Weight: 210 lb 9.6 oz (95.5 kg)    GENERAL:alert, no distress  and comfortable SKIN: skin color, texture, turgor are normal, no rashes or significant lesions EYES: normal, Conjunctiva are pink and non-injected, sclera clear OROPHARYNX:no exudate, no erythema and lips, buccal mucosa, and tongue normal  NECK: supple, thyroid normal size, non-tender, without nodularity LYMPH:  no palpable lymphadenopathy in the cervical, axillary or inguinal LUNGS: clear to auscultation and percussion with normal breathing effort HEART: regular rate & rhythm and no murmurs and no lower extremity edema ABDOMEN:abdomen soft, non-tender and normal bowel sounds MUSCULOSKELETAL:no cyanosis of digits and no clubbing  NEURO: alert & oriented x 3 with fluent speech, no focal motor/sensory deficits EXTREMITIES: No lower extremity edema   LABORATORY DATA:  I have reviewed the data as listed CMP Latest Ref Rng & Units 08/14/2018 07/26/2018  Glucose 70 - 99 mg/dL 82 125(H)  BUN 8 - 23 mg/dL 18 15  Creatinine 0.44 - 1.00 mg/dL 1.10(H) 1.21(H)  Sodium 135 - 145 mmol/L 139 141  Potassium 3.5 - 5.1 mmol/L 3.6 3.2(L)  Chloride 98 - 111 mmol/L 104 105  CO2 22 - 32 mmol/L 24 26  Calcium 8.9 - 10.3 mg/dL 9.3 9.8  Total Protein 6.5 - 8.1 g/dL - 7.4  Total Bilirubin 0.3 - 1.2 mg/dL - 0.5  Alkaline Phos 38 - 126 U/L - 52  AST 15 - 41 U/L - 23  ALT 0 - 44 U/L - 18    Lab Results  Component Value Date   WBC 5.2 08/14/2018   HGB 12.5 08/14/2018   HCT 42.2 08/14/2018   MCV 89.2 08/14/2018   PLT 235 08/14/2018   NEUTROABS 2.2 07/26/2018    ASSESSMENT & PLAN:  Ductal carcinoma in situ (DCIS) of left breast 08/18/2018: Left lumpectomy: Grade 2 DCIS, 2.3 cm, margins negative, ER 100%, PR 90%, no evidence of necrosis Tis NX stage 0  Pathology counseling: I discussed the final pathology report of the patient provided  a copy of this report. I discussed the margins as well as lymph node surgeries. We also discussed the final staging along with previously performed ER/PR  testing.  Recommendation: 1.  Radiation oncology felt that there may not be as much benefit for adjuvant radiation. 2.  Plan: Tamoxifen for 5 years  Tamoxifen counseling:We discussed the risks and benefits of tamoxifen. These include but not limited to insomnia, hot flashes, mood changes, vaginal dryness, and weight gain. Although rare, serious side effects including endometrial cancer, risk of blood clots were also discussed. We strongly believe that the benefits far outweigh the risks. Patient understands these risks and consented to starting treatment. Planned treatment duration is 5 years.  Return to clinic in 3 months for survivorship care plan visit.    No orders of the defined types were placed in this encounter.  The patient has a good understanding of the overall plan. she agrees with it. she will call with any problems that may develop before the next visit here.   Harriette Ohara, MD 09/01/18

## 2018-09-01 NOTE — Telephone Encounter (Signed)
Scheduled appt per 12/27 los - gave patient AVS and calender per los.  

## 2018-11-06 ENCOUNTER — Telehealth: Payer: Self-pay | Admitting: Hematology and Oncology

## 2018-11-06 NOTE — Telephone Encounter (Signed)
Called patient per 2/27 Vm schedulibg log.  No answer.  Patient wanted to schedule an appt but already had an appt scheduled.  No appt was made.

## 2018-11-09 ENCOUNTER — Telehealth: Payer: Self-pay | Admitting: Hematology and Oncology

## 2018-11-09 NOTE — Telephone Encounter (Signed)
Scheduled appt per 3/5 sch message

## 2018-11-21 NOTE — Progress Notes (Signed)
Patient Care Team: Velna Hatchet, MD as PCP - General (Internal Medicine) Jacolyn Reedy, MD as Consulting Physician (Cardiology) Jovita Kussmaul, MD as Consulting Physician (General Surgery) Nicholas Lose, MD as Consulting Physician (Hematology and Oncology) Gery Pray, MD as Consulting Physician (Radiation Oncology)  DIAGNOSIS:    ICD-10-CM   1. Ductal carcinoma in situ (DCIS) of left breast D05.12 MM DIAG BREAST TOMO BILATERAL    SUMMARY OF ONCOLOGIC HISTORY:   Ductal carcinoma in situ (DCIS) of left breast   07/17/2018 Initial Diagnosis    Screening detected left breast mass 11 o'clock position 1.6 cm with irregular margins axilla negative biopsy revealed intermediate grade DCIS ER 100%, PR 90%, Tis NX stage 0    08/18/2018 Surgery    Left lumpectomy: Grade 2 DCIS, 2.3 cm, margins negative, ER 100%, PR 90%, no evidence of necrosis Tis NX stage 0    09/18/2018 -  Anti-estrogen oral therapy    Tamoxifen 20mg  daily, plan for 5 years     CHIEF COMPLIANT: Follow-up of tamoxifen therapy  INTERVAL HISTORY: Alyssa Cardenas is a 78 y.o. with above-mentioned history of DCIS of the left breast treated with lumpectomy who is currently on tamoxifen therapy. Her most recent bone density scan on 04/12/18 showed a T-score of -0.1. She presents to the clinic today with her husband and notes ringing in her ears that began with tamoxifen and tolerable hot flashes in addition to a nodule in her left palm.   REVIEW OF SYSTEMS:   Constitutional: Denies fevers, chills or abnormal weight loss (+) hot flashes Eyes: Denies blurriness of vision Ears, nose, mouth, throat, and face: Denies mucositis or sore throat (+) ringing in ears Respiratory: Denies cough, dyspnea or wheezes Cardiovascular: Denies palpitation, chest discomfort Gastrointestinal: Denies nausea, heartburn or change in bowel habits Skin: Denies abnormal skin rashes Lymphatics: Denies new lymphadenopathy or easy bruising  Neurological: Denies numbness, tingling or new weaknesses Behavioral/Psych: Mood is stable, no new changes  Extremities: No lower extremity edema (+) nodule in left palm Breast: denies any pain or lumps or nodules in either breasts All other systems were reviewed with the patient and are negative.  I have reviewed the past medical history, past surgical history, social history and family history with the patient and they are unchanged from previous note.  ALLERGIES:  has No Known Allergies.  MEDICATIONS:  Current Outpatient Medications  Medication Sig Dispense Refill  . amLODipine (NORVASC) 10 MG tablet Take 10 mg by mouth daily.     Marland Kitchen aspirin EC 81 MG tablet Take 81 mg by mouth daily.    Marland Kitchen dimenhyDRINATE (DRAMAMINE) 50 MG tablet Take 50 mg by mouth daily as needed for dizziness.     . ezetimibe (ZETIA) 10 MG tablet Take 1 tablet (10 mg total) by mouth daily. 30 tablet 10  . fluticasone (FLONASE) 50 MCG/ACT nasal spray Place 2 sprays into both nostrils daily as needed for allergies or rhinitis.    Marland Kitchen HYDROcodone-acetaminophen (NORCO/VICODIN) 5-325 MG tablet Take 1-2 tablets by mouth every 6 (six) hours as needed for moderate pain or severe pain. 15 tablet 0  . Krill Oil 350 MG CAPS Take 350 mg by mouth daily.    Marland Kitchen loratadine (CLARITIN) 10 MG tablet Take 10 mg by mouth daily as needed for allergies.     Marland Kitchen losartan-hydrochlorothiazide (HYZAAR) 100-25 MG per tablet Take 0.5 tablets by mouth daily.     . metoprolol succinate (TOPROL-XL) 25 MG 24 hr tablet Take 25  mg by mouth daily.    . nitroGLYCERIN (NITROSTAT) 0.4 MG SL tablet Place 0.4 mg under the tongue every 5 (five) minutes as needed for chest pain.    . Polyvinyl Alcohol-Povidone PF (REFRESH) 1.4-0.6 % SOLN Place 2 drops into both eyes daily as needed (for dry eyes).    . potassium chloride (KLOR-CON) 20 MEQ packet Take 20 mEq by mouth daily.     . rosuvastatin (CRESTOR) 40 MG tablet Take 40 mg by mouth every evening.     . tamoxifen  (NOLVADEX) 20 MG tablet Take 1 tablet (20 mg total) by mouth daily. 90 tablet 3   No current facility-administered medications for this visit.     PHYSICAL EXAMINATION: ECOG PERFORMANCE STATUS: 1 - Symptomatic but completely ambulatory  Vitals:   11/22/18 1030  BP: (!) 142/73  Pulse: 65  Resp: 18  Temp: 97.9 F (36.6 C)  SpO2: 99%   Filed Weights   11/22/18 1030  Weight: 211 lb 1.6 oz (95.8 kg)    GENERAL: alert, no distress and comfortable SKIN: skin color, texture, turgor are normal, no rashes or significant lesions EYES: normal, Conjunctiva are pink and non-injected, sclera clear OROPHARYNX: no exudate, no erythema and lips, buccal mucosa, and tongue normal  NECK: supple, thyroid normal size, non-tender, without nodularity LYMPH: no palpable lymphadenopathy in the cervical, axillary or inguinal LUNGS: clear to auscultation and percussion with normal breathing effort HEART: regular rate & rhythm and no murmurs and no lower extremity edema ABDOMEN: abdomen soft, non-tender and normal bowel sounds MUSCULOSKELETAL: no cyanosis of digits and no clubbing  NEURO: alert & oriented x 3 with fluent speech, no focal motor/sensory deficits EXTREMITIES: No lower extremity edema  LABORATORY DATA:  I have reviewed the data as listed CMP Latest Ref Rng & Units 08/14/2018 07/26/2018  Glucose 70 - 99 mg/dL 82 125(H)  BUN 8 - 23 mg/dL 18 15  Creatinine 0.44 - 1.00 mg/dL 1.10(H) 1.21(H)  Sodium 135 - 145 mmol/L 139 141  Potassium 3.5 - 5.1 mmol/L 3.6 3.2(L)  Chloride 98 - 111 mmol/L 104 105  CO2 22 - 32 mmol/L 24 26  Calcium 8.9 - 10.3 mg/dL 9.3 9.8  Total Protein 6.5 - 8.1 g/dL - 7.4  Total Bilirubin 0.3 - 1.2 mg/dL - 0.5  Alkaline Phos 38 - 126 U/L - 52  AST 15 - 41 U/L - 23  ALT 0 - 44 U/L - 18    Lab Results  Component Value Date   WBC 5.2 08/14/2018   HGB 12.5 08/14/2018   HCT 42.2 08/14/2018   MCV 89.2 08/14/2018   PLT 235 08/14/2018   NEUTROABS 2.2 07/26/2018     ASSESSMENT & PLAN:  Ductal carcinoma in situ (DCIS) of left breast 08/18/2018: Left lumpectomy: Grade 2 DCIS, 2.3 cm, margins negative, ER 100%, PR 90%, no evidence of necrosis Tis NX stage 0 Patient was provided option for radiation versus antiestrogen therapy.  Patient preferred antiestrogens.  Current treatment: Tamoxifen 20 mg daily started 09/01/2018 held 11/22/2018 for tinnitus  Tamoxifen toxicities: Ringing in the years: Patient reports of this started soon after she started tamoxifen.  Because of this I instructed her to stop tamoxifen and watch for 2 weeks.  If her symptoms improve then we will call her a prescription for anastrozole.  She will call us in 2 weeks. Occasional hot flashes.  I discussed risks and benefits of anastrozole therapy.  If she were to switch from tamoxifen to anastrozole and she would  have to take precautions regarding bone density and take calcium and vitamin D.  Return to clinic in 3 months for follow-up    Orders Placed This Encounter  Procedures  . MM DIAG BREAST TOMO BILATERAL    Standing Status:   Future    Standing Expiration Date:   11/22/2019    Order Specific Question:   Reason for Exam (SYMPTOM  OR DIAGNOSIS REQUIRED)    Answer:   History of breast cancer    Order Specific Question:   Preferred imaging location?    Answer:   [facility]   The patient has a good understanding of the overall plan. she agrees with it. she will call with any problems that may develop before the next visit here.  Nicholas Lose, MD 11/22/2018  Julious Oka Dorshimer am acting as scribe for Dr. Nicholas Lose.  I have reviewed the above documentation for accuracy and completeness, and I agree with the above.

## 2018-11-22 ENCOUNTER — Other Ambulatory Visit: Payer: Self-pay

## 2018-11-22 ENCOUNTER — Inpatient Hospital Stay: Payer: Medicare Other | Attending: Hematology and Oncology | Admitting: Hematology and Oncology

## 2018-11-22 ENCOUNTER — Telehealth: Payer: Self-pay | Admitting: Hematology and Oncology

## 2018-11-22 DIAGNOSIS — D0512 Intraductal carcinoma in situ of left breast: Secondary | ICD-10-CM | POA: Diagnosis not present

## 2018-11-22 DIAGNOSIS — Z7982 Long term (current) use of aspirin: Secondary | ICD-10-CM | POA: Insufficient documentation

## 2018-11-22 DIAGNOSIS — N951 Menopausal and female climacteric states: Secondary | ICD-10-CM | POA: Insufficient documentation

## 2018-11-22 DIAGNOSIS — Z79810 Long term (current) use of selective estrogen receptor modulators (SERMs): Secondary | ICD-10-CM | POA: Diagnosis not present

## 2018-11-22 DIAGNOSIS — Z79899 Other long term (current) drug therapy: Secondary | ICD-10-CM | POA: Diagnosis not present

## 2018-11-22 NOTE — Telephone Encounter (Signed)
Gave avs and calendar ° °

## 2018-11-22 NOTE — Assessment & Plan Note (Addendum)
08/18/2018: Left lumpectomy: Grade 2 DCIS, 2.3 cm, margins negative, ER 100%, PR 90%, no evidence of necrosis Tis NX stage 0 Patient was provided option for radiation versus antiestrogen therapy.  Patient preferred antiestrogens.  Current treatment: Tamoxifen 20 mg daily started 09/01/2018 held 11/22/2018 for tinnitus  Tamoxifen toxicities: Ringing in the years: Patient reports of this started soon after she started tamoxifen.  Because of this I instructed her to stop tamoxifen and watch for 2 weeks.  If her symptoms improve then we will call her a prescription for anastrozole.  She will call us in 2 weeks. Occasional hot flashes.  Return to clinic in 3 months for follow-up

## 2018-12-18 ENCOUNTER — Telehealth: Payer: Self-pay

## 2018-12-18 ENCOUNTER — Other Ambulatory Visit: Payer: Self-pay | Admitting: Hematology and Oncology

## 2018-12-18 MED ORDER — ANASTROZOLE 1 MG PO TABS: 1.0000 mg | ORAL_TABLET | Freq: Every day | ORAL | 0 refills | Status: DC

## 2018-12-18 NOTE — Telephone Encounter (Signed)
Spoke with patient to follow up with ringing in the ears after stopping Tamoxifen X 2 weeks.    Pt states ringing in the ears has improved, however is still present but not nearly as severe.    Pt willing to try anastrozole per Dr. Geralyn Flash recommendations.  Nurse educated on potential side effects.  Voiced understanding, no further needs.

## 2018-12-20 ENCOUNTER — Telehealth: Payer: Self-pay | Admitting: Cardiology

## 2018-12-20 NOTE — Telephone Encounter (Signed)
Virtual Visit Pre-Appointment Phone Call  Steps For Call:  1. Confirm consent - "In the setting of the current Covid19 crisis, you are scheduled for a (phone or video) visit with your provider on (date) at (time).  Just as we do with many in-office visits, in order for you to participate in this visit, we must obtain consent.  If you'd like, I can send this to your mychart (if signed up) or email for you to review.  Otherwise, I can obtain your verbal consent now.  All virtual visits are billed to your insurance company just like a normal visit would be.  By agreeing to a virtual visit, we'd like you to understand that the technology does not allow for your provider to perform an examination, and thus may limit your provider's ability to fully assess your condition.  Finally, though the technology is pretty good, we cannot assure that it will always work on either your or our end, and in the setting of a video visit, we may have to convert it to a phone-only visit.  In either situation, we cannot ensure that we have a secure connection.  Are you willing to proceed?" STAFF: Did the patient verbally acknowledge consent to telehealth visit? Document YES/NO here: YES  2. Confirm the BEST phone number to call the day of the visit by including in appointment notes  3. Give patient instructions for WebEx/MyChart download to smartphone as below or Doximity/Doxy.me if video visit (depending on what platform provider is using)  4. Advise patient to be prepared with their blood pressure, heart rate, weight, any heart rhythm information, their current medicines, and a piece of paper and pen handy for any instructions they may receive the day of their visit  5. Inform patient they will receive a phone call 15 minutes prior to their appointment time (may be from unknown caller ID) so they should be prepared to answer  6. Confirm that appointment type is correct in Epic appointment notes (VIDEO vs  PHONE)     TELEPHONE CALL NOTE  Alyssa Cardenas has been deemed a candidate for a follow-up tele-health visit to limit community exposure during the Covid-19 pandemic. I spoke with the patient via phone to ensure availability of phone/video source, confirm preferred email & phone number, and discuss instructions and expectations.  I reminded Alyssa Cardenas to be prepared with any vital sign and/or heart rhythm information that could potentially be obtained via home monitoring, at the time of her visit. I reminded Alyssa Cardenas to expect a phone call at the time of her visit if her visit.  Alyssa Cardenas 12/20/2018 11:41 AM   INSTRUCTIONS FOR DOWNLOADING THE WEBEX APP TO SMARTPHONE  - If Apple, ask patient to go to [employer] and type in WebEx in the search bar. [place] [employer], the blue/green circle. If Android, go to [employer] and type in [employer] in the search bar. The app is free but as with any other app downloads, their phone may require them to verify saved payment information or Apple/Android password.  - The patient does NOT have to create an account. - On the day of the visit, the assist will walk the patient through joining the meeting with the meeting number/password.  INSTRUCTIONS FOR DOWNLOADING THE MYCHART APP TO SMARTPHONE  - The patient must first make sure to have activated MyChart and know their login information - If Apple, go to [employer] and type in  MyChart in the search bar and download the app. If Android, ask patient to go to [employer] and type in [place] in the search bar and download the app. The app is free but as with any other app downloads, their phone may require them to verify saved payment information or Apple/Android password.  - The patient will need to then log into the app with their MyChart username and password, and select [facility] as their healthcare provider to link the account. When it is time for your visit, go to  the MyChart app, find appointments, and click Begin Video Visit. Be sure to Select Allow for your device to access the Microphone and Camera for your visit. You will then be connected, and your provider will be with you shortly.  **If they have any issues connecting, or need assistance please contact MyChart service desk [phone])**  **If using a computer, in order to ensure the best quality for their visit they will need to use either of the following Internet Browsers: [employer], or Google Chrome**  IF USING DOXIMITY or DOXY.ME - The patient will receive a link just prior to their visit, either by text or email (to be determined day of appointment depending on if it's doxy.me or Doximity).     FULL LENGTH CONSENT FOR TELE-HEALTH VISIT   I hereby voluntarily request, consent and authorize [facility] and its employed or contracted physicians, physician assistants, nurse practitioners or other licensed health care professionals (the Practitioner), to provide me with telemedicine health care services (the Services") as deemed necessary by the treating Practitioner. I acknowledge and consent to receive the Services by the Practitioner via telemedicine. I understand that the telemedicine visit will involve communicating with the Practitioner through live audiovisual communication technology and the disclosure of certain medical information by electronic transmission. I acknowledge that I have been given the opportunity to request an in-person assessment or other available alternative prior to the telemedicine visit and am voluntarily participating in the telemedicine visit.  I understand that I have the right to withhold or withdraw my consent to the use of telemedicine in the course of my care at any time, without affecting my right to future care or treatment, and that the Practitioner or I may terminate the telemedicine visit at any time. I understand that I have the right to  inspect all information obtained and/or recorded in the course of the telemedicine visit and may receive copies of available information for a reasonable fee.  I understand that some of the potential risks of receiving the Services via telemedicine include:   Delay or interruption in medical evaluation due to technological equipment failure or disruption;  Information transmitted may not be sufficient (e.g. poor resolution of images) to allow for appropriate medical decision making by the Practitioner; and/or   In rare instances, security protocols could fail, causing a breach of personal health information.  Furthermore, I acknowledge that it is my responsibility to provide information about my medical history, conditions and care that is complete and accurate to the best of my ability. I acknowledge that Practitioner's advice, recommendations, and/or decision may be based on factors not within their control, such as incomplete or inaccurate data provided by me or distortions of diagnostic images or specimens that may result from electronic transmissions. I understand that the practice of medicine is not an exact science and that Practitioner makes no warranties or guarantees regarding treatment outcomes. I acknowledge that I will receive a copy  of this consent concurrently upon execution via email to the email address I last provided but may also request a printed copy by calling the office of [facility].    I understand that my insurance will be billed for this visit.   I have read or had this consent read to me.  I understand the contents of this consent, which adequately explains the benefits and risks of the Services being provided via telemedicine.   I have been provided ample opportunity to ask questions regarding this consent and the Services and have had my questions answered to my satisfaction.  I give my informed consent for the services to be provided through the use of  telemedicine in my medical care  By participating in this telemedicine visit I agree to the above.

## 2018-12-26 ENCOUNTER — Other Ambulatory Visit: Payer: Self-pay

## 2018-12-26 ENCOUNTER — Telehealth (INDEPENDENT_AMBULATORY_CARE_PROVIDER_SITE_OTHER): Payer: Medicare Other | Admitting: Cardiology

## 2018-12-26 ENCOUNTER — Telehealth: Payer: Self-pay | Admitting: Cardiology

## 2018-12-26 ENCOUNTER — Encounter: Payer: Self-pay | Admitting: Cardiology

## 2018-12-26 VITALS — BP 110/58 | HR 64 | Wt 205.0 lb

## 2018-12-26 DIAGNOSIS — E782 Mixed hyperlipidemia: Secondary | ICD-10-CM

## 2018-12-26 DIAGNOSIS — I255 Ischemic cardiomyopathy: Secondary | ICD-10-CM

## 2018-12-26 DIAGNOSIS — I119 Hypertensive heart disease without heart failure: Secondary | ICD-10-CM | POA: Diagnosis not present

## 2018-12-26 DIAGNOSIS — I2510 Atherosclerotic heart disease of native coronary artery without angina pectoris: Secondary | ICD-10-CM

## 2018-12-26 DIAGNOSIS — D0512 Intraductal carcinoma in situ of left breast: Secondary | ICD-10-CM

## 2018-12-26 DIAGNOSIS — I447 Left bundle-branch block, unspecified: Secondary | ICD-10-CM

## 2018-12-26 DIAGNOSIS — I252 Old myocardial infarction: Secondary | ICD-10-CM

## 2018-12-26 NOTE — Progress Notes (Signed)
Virtual Visit via Telephone Note   This visit type was conducted due to national recommendations for restrictions regarding the COVID-19 Pandemic (e.g. social distancing) in an effort to limit this patient's exposure and mitigate transmission in our community.  Due to her co-morbid illnesses, this patient is at least at moderate risk for complications without adequate follow up.  This format is felt to be most appropriate for this patient at this time.  The patient did not have access to video technology/had technical difficulties with video requiring transitioning to audio format only (telephone).  All issues noted in this document were discussed and addressed.  No physical exam could be performed with this format.  Please refer to the patient's chart for her  consent to telehealth for [facility]  Evaluation Performed:  Follow-up visit  This visit type was conducted due to national recommendations for restrictions regarding the COVID-19 Pandemic (e.g. social distancing).  This format is felt to be most appropriate for this patient at this time.  All issues noted in this document were discussed and addressed.  No physical exam was performed (except for noted visual exam findings with Video Visits).  Please refer to the patient's chart (MyChart message for video visits and phone note for telephone visits) for the patient's consent to telehealth for [facility].  Date:  12/26/2018  ID: Alyssa Cardenas, DOB Nov 14, 1940, MRN [id number]   Patient Location: [place] [address]   Provider location:   [place] Office  PCP:  Velna Hatchet, MD  Cardiologist:  Jenne Campus, MD     Chief Complaint: Doing well  History of Present Illness:    Alyssa Cardenas is a 78 y.o. female  who presents via audio/video conferencing for a telehealth visit today.  With history of coronary artery disease status post remote coronary artery bypass graft.  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  Overall she survived surgery with no difficulty doing well she was put on medication after that and holding quite well.  Denies having any chest pain, tightness, pressure, burning, squeezing in the chest.  She is keeping restriction of coronavirus and try not to go to the store too often.  Her son actually bring food for her.  She still goes and walk outside with no difficulties.  Overall cardiac wise appears to be doing well.   The patient does not have symptoms concerning for COVID-19 infection (fever, chills, cough, or new SHORTNESS OF BREATH).    Prior CV studies:   The following studies were reviewed today:  Stress test done on August 19, 2018 showed:   Nuclear stress EF: 34%.  There was no ST segment deviation noted during stress.  Findings consistent with prior myocardial infarction.  This is an intermediate risk study.  The left ventricular ejection fraction is moderately decreased (30-44%).  Echocardiogram done in August 01, 2018 showed:  - Diminished LVEF - 40-45%   Hypokinesis of the anterior and atero septal wall.   Paradoxical intraventricular septal motion - LBBB   Mild - moderate MR.   Past Medical History:  Diagnosis Date   Anxiety    CAD (coronary artery disease), native coronary artery    Cath 08/27/1991 Normal Left main, calcified proximal LAD, 50% stenosis proximal LAD,  90% stenosis mid LAD, occluded Diag 1, 95% stenosis proximal Diag 2, 95% stenosis proximal CFX, 95% stenosis proximal OM 1, occluded RCA;  CABG w LIMA to LAD, SVG to dx, SVG to OM1-2-circ, SVG to PDA 08/29/1991 Dr. 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  EF     Hyperlipidemia    Hypertensive heart disease     Myocardial infarction ([place])    Obesity (BMI 30-39.9)    Vitamin D deficiency 03/10/2015    Past Surgical History:  Procedure Laterality Date   BREAST BIOPSY     BREAST EXCISIONAL BIOPSY Bilateral    BREAST LUMPECTOMY WITH RADIOACTIVE SEED LOCALIZATION Left 08/18/2018   Procedure: LEFT BREAST LUMPECTOMY WITH RADIOACTIVE SEED LOCALIZATION;  Surgeon: Autumn Messing III, MD;  Location: [place];  Service: General;  Laterality: Left;   CORONARY ARTERY BYPASS GRAFT  1992   EYE SURGERY     cataract removal 2017   TONSILLECTOMY       Current Meds  Medication Sig   amLODipine (NORVASC) 10 MG tablet Take 10 mg by mouth daily.    anastrozole (ARIMIDEX) 1 MG tablet TAKE 1 TABLET(1 MG) BY MOUTH DAILY   aspirin EC 81 MG tablet Take 81 mg by mouth daily.   dimenhyDRINATE (DRAMAMINE) 50 MG tablet Take 50 mg by mouth daily as needed for dizziness.    ezetimibe (ZETIA) 10 MG tablet Take 1 tablet (10 mg total) by mouth daily.   fluticasone (FLONASE) 50 MCG/ACT nasal spray Place 2 sprays into both nostrils daily as needed for allergies or rhinitis.   Krill Oil 350 MG CAPS Take 350 mg by mouth daily.   loratadine (CLARITIN) 10 MG tablet Take 10 mg by mouth daily as needed for allergies.    losartan-hydrochlorothiazide (HYZAAR) 100-25 MG per tablet Take 0.5 tablets by mouth daily.    metoprolol succinate (TOPROL-XL) 25 MG 24 hr tablet Take 25 mg by mouth daily.   nitroGLYCERIN (NITROSTAT) 0.4 MG SL tablet Place 0.4 mg under the tongue every 5 (five) minutes as needed for chest pain.   Polyvinyl Alcohol-Povidone PF (REFRESH) 1.4-0.6 % SOLN Place 2 drops into both eyes daily as needed (for dry eyes).   potassium chloride (KLOR-CON) 20 MEQ packet Take 20 mEq by mouth daily.    rosuvastatin (CRESTOR) 40 MG tablet Take 40 mg by mouth every evening.       Family History: The patient's  family history includes Diabetes in her maternal grandmother; Heart disease in her brother, brother, father, mother, and paternal grandfather; Hyperlipidemia in her brother, brother, and mother; Hypertension in her brother, brother, father, and mother; Lung cancer in her father; Mental illness in her brother; Stroke in her maternal grandfather and paternal grandmother.   ROS:   Please see the history of present illness.     All other systems reviewed and are negative.   Labs/Other Tests and Data Reviewed:     Recent Labs: 07/26/2018: ALT 18 08/14/2018: BUN 18; Creatinine, Ser 1.10; Hemoglobin 12.5; Platelets 235; Potassium 3.6; Sodium 139  Recent Lipid Panel No results found for: CHOL, TRIG, HDL, CHOLHDL, VLDL, LDLCALC, LDLDIRECT    Exam:    Vital Signs:  BP (!) 110/58    Pulse 64    Wt 205 lb (93 kg)    BMI 37.49 kg/m  Wt Readings from Last 3 Encounters:  12/26/18 205 lb (93 kg)  11/22/18 211 lb 1.6 oz (95.8 kg)  09/01/18 210 lb 9.6 oz (95.5 kg)     Well nourished, well developed in no acute distress. We were talking only over the phone.  She had no technical ability to do video linking.  Overall she appears to be happy and cheerful happy to be able to talk to me without any distress.  Diagnosis for this visit:   1. Hypertensive heart disease    2. Ischemic cardiomyopathy   3. Left bundle branch block   4. Mixed hyperlipidemia   5. History of MI (myocardial infarction)   6. Coronary artery disease involving native coronary artery of native heart without angina pectoris   7. Ductal carcinoma in situ (DCIS) of left breast      ASSESSMENT & PLAN:    1.  Hypertensive heart disease blood pressure well controlled continue present management. 2.  Ischemic cardiomyopathy on appropriate medication which include ARB as well as beta-blocker.  Will continue. 3.  Left bundle branch block chronic problem.  Noted. 4.  Dyslipidemia she is on Crestor 40 as well as Zetia which I  will continue.  Last cholesterol check was in December of last year will make arrangements for her to have cholesterol rechecked again.  We will do it after coronavirus restriction will be lifted. 5.  History of myocardial infarction with ejection fraction 4045%.  Noted. 6.  Ductal carcinoma in situ status post surgery doing well from that point review recover from surgery completely surgery was uncomplicated.  COVID-19 Education: The signs and symptoms of COVID-19 were discussed with the patient and how to seek care for testing (follow up with PCP or arrange E-visit).  The importance of social distancing was discussed today.  Patient Risk:   After full review of this patients clinical status, I feel that they are at least moderate risk at this time.  Time:   Today, I have spent 18 minutes with the patient with telehealth technology discussing pt health issues.  I spent 5 minutes reviewing her chart before the visit.  Visit was finished at 10:15 AM.    Medication Adjustments/Labs and Tests Ordered: Current medicines are reviewed at length with the patient today.  Concerns regarding medicines are outlined above.  No orders of the defined types were placed in this encounter.  Medication changes: No orders of the defined types were placed in this encounter.    Disposition: Follow-up in 5 months, fasting lipid profile will be done  Signed, Park Liter, MD, [facility] 12/26/2018 10:17 AM    [place]

## 2018-12-26 NOTE — Patient Instructions (Signed)
Medication Instructions:  Your physician recommends that you continue on your current medications as directed. Please refer to the Current Medication list given to you today.  If you need a refill on your cardiac medications before your next appointment, please call your pharmacy.   Lab work: Your physician recommends that you return for lab work WHEN RESTRICTIONS LIFTED [place]  If you have labs (blood work) drawn today and your tests are completely normal, you will receive your results only by: Marland Kitchen MyChart Message (if you have MyChart) OR . A paper copy in the mail If you have any lab test that is abnormal or we need to change your treatment, we will call you to review the results.  Testing/Procedures: None  Follow-Up: At [facility], you and your health needs are our priority.  As part of our continuing mission to provide you with exceptional heart care, we have created designated Provider Care Teams.  These Care Teams include your primary Cardiologist (physician) and Advanced Practice Providers (APPs -  Physician Assistants and Nurse Practitioners) who all work together to provide you with the care you need, when you need it. You will need a follow up appointment in 5 months.  Any Other Special Instructions Will Be Listed Below (If Applicable).

## 2018-12-26 NOTE — Telephone Encounter (Signed)

## 2019-01-17 ENCOUNTER — Telehealth: Payer: Self-pay | Admitting: *Deleted

## 2019-01-17 NOTE — Telephone Encounter (Signed)
Received call from pt stating that she has taken anastrozole for one month and has noticed a decrease in the ringing in her ears that she was experiencing from Tamoxifen. Pt is happy with the improvement and wants to continue taking anastrozole.  Nurse educated pt to call if she experiences worsening symptoms and pt verbalized understanding.

## 2019-02-16 ENCOUNTER — Telehealth: Payer: Self-pay | Admitting: Hematology and Oncology

## 2019-02-16 NOTE — Assessment & Plan Note (Signed)
08/18/2018:Left lumpectomy: Grade 2 DCIS, 2.3 cm, margins negative, ER 100%, PR 90%, no evidence of necrosis Tis NX stage 0 Patient was provided option for radiation versus antiestrogen therapy.  Patient preferred antiestrogens.  Current treatment: Tamoxifen 20 mg daily started 09/01/2018 held 11/22/2018 for tinnitus, switched to anastrozole 11/22/2018.  Anastrozole toxicities:  Breast cancer surveillance: Mammogram will be done November 2020 Return to clinic in 1 year for follow-up

## 2019-02-16 NOTE — Telephone Encounter (Signed)
Spoke with patient re converting 6/18 f/u to phone visit. Per patient request she would prefer the phone visit - not other forms of technology.

## 2019-02-21 ENCOUNTER — Telehealth: Payer: Self-pay | Admitting: Hematology and Oncology

## 2019-02-21 NOTE — Progress Notes (Signed)
  HEMATOLOGY-ONCOLOGY TELEPHONE VISIT PROGRESS NOTE  I connected with [name] on 02/22/2019 at 10:15 AM EDT by telephone and verified that I am speaking with the correct person using two identifiers.  I discussed the limitations, risks, security and privacy concerns of performing an evaluation and management service by telephone and the availability of in person appointments.  I also discussed with the patient that there may be a patient responsible charge related to this service. The patient expressed understanding and agreed to proceed.   History of Present Illness: Alyssa Cardenas is a 78 y.o. female with above-mentioned history of DCIS of the left breast treated with lumpectomy who is currently on anti-estrogen therapy with anastrozole, after tamoxifen led to severe ringing in her ears. She is over the phone today for 3 month follow-up.   Oncology History  Ductal carcinoma in situ (DCIS) of left breast  07/17/2018 Initial Diagnosis   Screening detected left breast mass 11 o'clock position 1.6 cm with irregular margins axilla negative biopsy revealed intermediate grade DCIS ER 100%, PR 90%, Tis NX stage 0   08/18/2018 Surgery   Left lumpectomy: Grade 2 DCIS, 2.3 cm, margins negative, ER 100%, PR 90%, no evidence of necrosis Tis NX stage 0   09/18/2018 -  Anti-estrogen oral therapy   Tamoxifen 20mg  daily, switched to anastrozole on 12/28/18 due to ringing in her ears, plan for 5 years     Observations/Objective:  No clinical evidence of relapse   Assessment Plan:  Ductal carcinoma in situ (DCIS) of left breast 08/18/2018:Left lumpectomy: Grade 2 DCIS, 2.3 cm, margins negative, ER 100%, PR 90%, no evidence of necrosis Tis NX stage 0 Patient was provided option for radiation versus antiestrogen therapy.  Patient preferred antiestrogens.  Current treatment: Tamoxifen 20 mg daily started 09/01/2018 held 11/22/2018 for tinnitus, switched to anastrozole 11/22/2018.  Anastrozole  toxicities: Ringing in ears improved  Occ Hot flashes Joint pains: Not having an issues with that  Sent a years worth of refills Breast cancer surveillance: Mammogram will be done November 2020 Return to clinic in 1 year for follow-up  I discussed the assessment and treatment plan with the patient. The patient was provided an opportunity to ask questions and all were answered. The patient agreed with the plan and demonstrated an understanding of the instructions. The patient was advised to call back or seek an in-person evaluation if the symptoms worsen or if the condition fails to improve as anticipated.   I provided 15 minutes of non-face-to-face time during this encounter.   Rulon Eisenmenger, MD 02/22/2019    I, Molly Dorshimer, am acting as scribe for Nicholas Lose, MD.  I have reviewed the above documentation for accuracy and completeness, and I agree with the above.

## 2019-02-22 ENCOUNTER — Inpatient Hospital Stay: Payer: Medicare Other | Attending: Hematology and Oncology | Admitting: Hematology and Oncology

## 2019-02-22 DIAGNOSIS — D0512 Intraductal carcinoma in situ of left breast: Secondary | ICD-10-CM

## 2019-02-22 DIAGNOSIS — Z170 Estrogen receptor positive status [ER+]: Secondary | ICD-10-CM | POA: Diagnosis not present

## 2019-02-22 DIAGNOSIS — Z79810 Long term (current) use of selective estrogen receptor modulators (SERMs): Secondary | ICD-10-CM

## 2019-02-22 MED ORDER — ANASTROZOLE 1 MG PO TABS: ORAL_TABLET | ORAL | 3 refills | Status: DC

## 2019-05-23 ENCOUNTER — Other Ambulatory Visit: Payer: Self-pay

## 2019-05-23 ENCOUNTER — Ambulatory Visit (INDEPENDENT_AMBULATORY_CARE_PROVIDER_SITE_OTHER): Payer: Medicare Other | Admitting: Cardiology

## 2019-05-23 ENCOUNTER — Encounter: Payer: Self-pay | Admitting: Cardiology

## 2019-05-23 VITALS — BP 116/64 | HR 73 | Ht 62.0 in | Wt 213.0 lb

## 2019-05-23 DIAGNOSIS — I255 Ischemic cardiomyopathy: Secondary | ICD-10-CM | POA: Diagnosis not present

## 2019-05-23 DIAGNOSIS — E782 Mixed hyperlipidemia: Secondary | ICD-10-CM | POA: Diagnosis not present

## 2019-05-23 DIAGNOSIS — Z951 Presence of aortocoronary bypass graft: Secondary | ICD-10-CM | POA: Diagnosis not present

## 2019-05-23 DIAGNOSIS — I252 Old myocardial infarction: Secondary | ICD-10-CM | POA: Diagnosis not present

## 2019-05-23 MED ORDER — EZETIMIBE 10 MG PO TABS: 10.0000 mg | ORAL_TABLET | Freq: Every day | ORAL | 10 refills | Status: DC

## 2019-05-23 NOTE — Patient Instructions (Signed)

## 2019-05-23 NOTE — Progress Notes (Signed)
Cardiology Office Note:    Date:  05/23/2019   ID:  Alyssa Cardenas, DOB [birth year], MRN [id number]  PCP:  Velna Hatchet, MD  Cardiologist:  Jenne Campus, MD    Referring MD: Velna Hatchet, MD   Chief Complaint  Patient presents with  . Follow-up  Doing well  History of Present Illness:    Alyssa Cardenas is a 78 y.o. female with coronary artery disease, status post coronary bypass graft, ischemic cardiomyopathy with ejection fraction 40 to 45%, chronic left bundle branch block comes today to my office for follow-up overall doing very well denies have any chest pain tightness squeezing pressure burning chest.  She admits that she is not too active and she understands a problem trying to be somewhat more active.  Past Medical History:  Diagnosis Date  . Anxiety   . CAD (coronary artery disease), native coronary artery    Cath 08/27/1991 Normal Left main, calcified proximal LAD, 50% stenosis proximal LAD, 90% stenosis mid LAD, occluded Diag 1, 95% stenosis proximal Diag 2, 95% stenosis proximal CFX, 95% stenosis proximal OM 1, occluded RCA;  CABG w LIMA to LAD, SVG to dx, SVG to OM1-2-circ, SVG to PDA 08/29/1991 Dr. Redmond Pulling    . Cancer ([place])   . Chronic kidney disease   . History of colonic polyps 03/10/2015  . 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 Hyperlipidemia   . Hypertensive heart disease    . Myocardial infarction ([place])   . Obesity (BMI 30-39.9)   . Vitamin D deficiency 03/10/2015    Past Surgical History:  Procedure Laterality Date  . BREAST BIOPSY    . BREAST EXCISIONAL BIOPSY Bilateral   . BREAST LUMPECTOMY WITH RADIOACTIVE SEED LOCALIZATION Left 08/18/2018   Procedure: LEFT BREAST LUMPECTOMY WITH RADIOACTIVE SEED LOCALIZATION;  Surgeon: Jovita Kussmaul, MD;  Location: [place];  Service: General;  Laterality: Left;  . CORONARY ARTERY BYPASS GRAFT  1992  . EYE SURGERY     cataract removal 2017  . TONSILLECTOMY      Current Medications: Current Meds  Medication Sig  . amLODipine (NORVASC) 10 MG tablet Take 10 mg by mouth daily.   Marland Kitchen anastrozole (ARIMIDEX) 1 MG tablet TAKE 1 TABLET(1 MG) BY MOUTH DAILY  . aspirin EC 81 MG tablet Take 81 mg by mouth daily.  . Cholecalciferol (VITAMIN D3) 1.25 MG (50000 UT) CAPS Take 1 tablet by mouth once a week.  . dimenhyDRINATE (DRAMAMINE) 50 MG tablet Take 50 mg by mouth daily as needed for dizziness.   . ezetimibe (ZETIA) 10 MG tablet Take 1 tablet (10 mg total) by mouth daily.  . fluticasone (FLONASE) 50 MCG/ACT nasal spray Place 2 sprays into both nostrils daily as needed for allergies or rhinitis.  Javier Docker Oil 350 MG CAPS Take 350 mg by mouth daily.  Marland Kitchen loratadine (CLARITIN) 10 MG tablet Take 10 mg by mouth daily as needed for allergies.   Marland Kitchen losartan-hydrochlorothiazide (HYZAAR) 100-25 MG per tablet Take 0.5 tablets by mouth daily.   . metoprolol succinate (TOPROL-XL) 25 MG 24 hr tablet Take 25 mg by mouth daily.  . nitroGLYCERIN (NITROSTAT) 0.4 MG SL tablet Place 0.4 mg under the tongue every 5 (five) minutes as needed for chest pain.  . Polyvinyl Alcohol-Povidone PF (REFRESH) 1.4-0.6 % SOLN Place 2 drops into both eyes daily as needed (for dry eyes).  . potassium chloride (KLOR-CON) 20 MEQ packet  Take 20 mEq by mouth daily.   . rosuvastatin (CRESTOR) 40 MG tablet Take 40 mg by mouth every evening.      Allergies:   Patient has no known allergies.   Social History   Socioeconomic History  . Marital status: Married    Spouse name: Not on file  . Number of children: Not on file  . Years of education: Not on file  . Highest education level: Not on file  Occupational History  . Not on file  Social Needs  . Financial resource strain: Not on file  . Food insecurity    Worry: Not on file    Inability: Not on file  . Transportation needs    Medical: Not on file    Non-medical: Not on file  Tobacco Use  . Smoking status: Former Smoker    Quit date:  03/10/1975    Years since quitting: 44.2  . Smokeless tobacco: Never Used  Substance and Sexual Activity  . Alcohol use: Yes    Alcohol/week: 0.0 standard drinks    Comment: Special occasions  . Drug use: No  . Sexual activity: Yes    Birth control/protection: Post-menopausal  Lifestyle  . Physical activity    Days per week: Not on file    Minutes per session: Not on file  . Stress: Not on file  Relationships  . Social Herbalist on phone: Not on file    Gets together: Not on file    Attends religious service: Not on file    Active member of club or organization: Not on file    Attends meetings of clubs or organizations: Not on file    Relationship status: Not on file  Other Topics Concern  . Not on file  Social History Narrative  . Not on file     Family History: The patient's family history includes Diabetes in her maternal grandmother; Heart disease in her brother, brother, father, mother, and paternal grandfather; Hyperlipidemia in her brother, brother, and mother; Hypertension in her brother, brother, father, and mother; Lung cancer in her father; Mental illness in her brother; Stroke in her maternal grandfather and paternal grandmother. ROS:   Please see the history of present illness.    All 14 point review of systems negative except as described per history of present illness  EKGs/Labs/Other Studies Reviewed:      Recent Labs: 07/26/2018: ALT 18 08/14/2018: BUN 18; Creatinine, Ser 1.10; Hemoglobin 12.5; Platelets 235; Potassium 3.6; Sodium 139  Recent Lipid Panel No results found for: CHOL, TRIG, HDL, CHOLHDL, VLDL, LDLCALC, LDLDIRECT  Physical Exam:    VS:  BP 116/64   Pulse 73   Ht 5\' 2"  (1.575 m)   Wt 213 lb (96.6 kg)   SpO2 96%   BMI 38.96 kg/m     Wt Readings from Last 3 Encounters:  05/23/19 213 lb (96.6 kg)  12/26/18 205 lb (93 kg)  11/22/18 211 lb 1.6 oz (95.8 kg)     GEN:  Well nourished, well developed in no acute distress HEENT:  Normal NECK: No JVD; No carotid bruits LYMPHATICS: No lymphadenopathy CARDIAC: RRR, no murmurs, no rubs, no gallops RESPIRATORY:  Clear to auscultation without rales, wheezing or rhonchi  ABDOMEN: Soft, non-tender, non-distended MUSCULOSKELETAL:  No edema; No deformity  SKIN: Warm and dry LOWER EXTREMITIES: no swelling NEUROLOGIC:  Alert and oriented x 3 PSYCHIATRIC:  Normal affect   ASSESSMENT:    1. Ischemic cardiomyopathy   2. Status  post coronary artery bypass graft   3. Mixed hyperlipidemia   4. History of MI (myocardial infarction)    PLAN:    In order of problems listed above:  1. Ischemic cardiomyopathy.  On appropriate medication which include Hyzaar as well as Toprol-XL which I will continue. 2. Status post coronary bypass graft/coronary artery disease asymptomatic last stress test was negative done last year 3. Mixed dyslipidemia on high intensity statin Crestor 40 mg daily LDL 66 HDL 54 in August 2020 we will continue 4. History of prior MI.  Noted   Medication Adjustments/Labs and Tests Ordered: Current medicines are reviewed at length with the patient today.  Concerns regarding medicines are outlined above.  No orders of the defined types were placed in this encounter.  Medication changes: No orders of the defined types were placed in this encounter.   Signed, Park Liter, MD, [facility] 05/23/2019 10:35 AM    [place]

## 2019-07-03 ENCOUNTER — Other Ambulatory Visit: Payer: Self-pay

## 2019-07-03 ENCOUNTER — Ambulatory Visit
Admission: RE | Admit: 2019-07-03 | Discharge: 2019-07-03 | Disposition: A | Discharge status: Home / Self Care | Payer: Medicare Other | Source: Ambulatory Visit | Attending: Hematology and Oncology | Admitting: Hematology and Oncology

## 2019-07-03 DIAGNOSIS — D0512 Intraductal carcinoma in situ of left breast: Secondary | ICD-10-CM

## 2019-10-29 ENCOUNTER — Ambulatory Visit (INDEPENDENT_AMBULATORY_CARE_PROVIDER_SITE_OTHER): Payer: Medicare Other | Admitting: Cardiology

## 2019-10-29 ENCOUNTER — Encounter: Payer: Self-pay | Admitting: Cardiology

## 2019-10-29 ENCOUNTER — Other Ambulatory Visit: Payer: Self-pay

## 2019-10-29 VITALS — BP 126/68 | HR 72 | Ht 62.0 in | Wt 215.8 lb

## 2019-10-29 DIAGNOSIS — I2510 Atherosclerotic heart disease of native coronary artery without angina pectoris: Secondary | ICD-10-CM

## 2019-10-29 DIAGNOSIS — Z951 Presence of aortocoronary bypass graft: Secondary | ICD-10-CM

## 2019-10-29 DIAGNOSIS — I119 Hypertensive heart disease without heart failure: Secondary | ICD-10-CM

## 2019-10-29 DIAGNOSIS — I255 Ischemic cardiomyopathy: Secondary | ICD-10-CM | POA: Diagnosis not present

## 2019-10-29 DIAGNOSIS — I447 Left bundle-branch block, unspecified: Secondary | ICD-10-CM | POA: Diagnosis not present

## 2019-10-29 NOTE — Patient Instructions (Signed)

## 2019-10-29 NOTE — Progress Notes (Signed)
Cardiology Office Note:    Date:  10/29/2019   ID:  Alyssa Cardenas, DOB Mar 29, 1941, MRN [id number]  PCP:  Velna Hatchet, MD  Cardiologist:  Jenne Campus, MD    Referring MD: Velna Hatchet, MD   No chief complaint on file. Doing well  History of Present Illness:    Alyssa Cardenas is a 79 y.o. female with past medical history significant for coronary artery disease, Supples cardiac catheterization and coronary artery bypass graft done in 1992, left bundle branch block, cardiomyopathy ejection fraction 45%, obesity, dyslipidemia.  Comes today to my office for follow-up.  Overall seems to be doing well.  Denies have any chest pain, tightness, pressure, burning in the chest.  She is anxiously waiting for spring hoping to be able to work in the garden.  Past Medical History:  Diagnosis Date  . Anxiety   . CAD (coronary artery disease), native coronary artery    Cath 08/27/1991 Normal Left main, calcified proximal LAD, 50% stenosis proximal LAD, 90% stenosis mid LAD, occluded Diag 1, 95% stenosis proximal Diag 2, 95% stenosis proximal CFX, 95% stenosis proximal OM 1, occluded RCA;  CABG w LIMA to LAD, SVG to dx, SVG to OM1-2-circ, SVG to PDA 08/29/1991 Dr. Redmond Pulling    . Cancer ([place])   . Chronic kidney disease   . History of colonic polyps 03/10/2015  . 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 Hyperlipidemia   . Hypertensive heart disease    . Myocardial infarction ([place])   . Obesity (BMI 30-39.9)   . Vitamin D deficiency 03/10/2015    Past Surgical History:  Procedure Laterality Date  . BREAST BIOPSY    . BREAST EXCISIONAL BIOPSY Bilateral   . BREAST LUMPECTOMY Left    2019 DCIS  . BREAST LUMPECTOMY WITH RADIOACTIVE SEED LOCALIZATION Left 08/18/2018   Procedure: LEFT BREAST LUMPECTOMY WITH RADIOACTIVE SEED LOCALIZATION;  Surgeon: Jovita Kussmaul, MD;  Location: [facility];  Service: General;  Laterality: Left;  . CORONARY ARTERY BYPASS  GRAFT  1992  . EYE SURGERY     cataract removal 2017  . TONSILLECTOMY      Current Medications: Current Meds  Medication Sig  . amLODipine (NORVASC) 10 MG tablet Take 10 mg by mouth daily.   Marland Kitchen anastrozole (ARIMIDEX) 1 MG tablet TAKE 1 TABLET(1 MG) BY MOUTH DAILY  . aspirin EC 81 MG tablet Take 81 mg by mouth daily.  . Cholecalciferol (VITAMIN D3) 1.25 MG (50000 UT) CAPS Take 1 tablet by mouth once a week.  . dimenhyDRINATE (DRAMAMINE) 50 MG tablet Take 50 mg by mouth daily as needed for dizziness.   . ezetimibe (ZETIA) 10 MG tablet Take 1 tablet (10 mg total) by mouth daily.  . fluticasone (FLONASE) 50 MCG/ACT nasal spray Place 2 sprays into both nostrils daily as needed for allergies or rhinitis.  Javier Docker Oil 350 MG CAPS Take 350 mg by mouth daily.  Marland Kitchen loratadine (CLARITIN) 10 MG tablet Take 10 mg by mouth daily as needed for allergies.   Marland Kitchen losartan-hydrochlorothiazide (HYZAAR) 100-25 MG per tablet Take 0.5 tablets by mouth daily.   . metoprolol succinate (TOPROL-XL) 25 MG 24 hr tablet Take 25 mg by mouth daily.  . nitroGLYCERIN (NITROSTAT) 0.4 MG SL tablet Place 0.4 mg under the tongue every 5 (five) minutes as needed for chest pain.  . Polyvinyl Alcohol-Povidone PF (REFRESH) 1.4-0.6 % SOLN Place 2 drops into  both eyes daily as needed (for dry eyes).  . potassium chloride (KLOR-CON) 20 MEQ packet Take 20 mEq by mouth daily.   . rosuvastatin (CRESTOR) 40 MG tablet Take 40 mg by mouth every evening.      Allergies:   Patient has no known allergies.   Social History   Socioeconomic History  . Marital status: Married    Spouse name: Not on file  . Number of children: Not on file  . Years of education: Not on file  . Highest education level: Not on file  Occupational History  . Not on file  Tobacco Use  . Smoking status: Former Smoker    Quit date: 03/10/1975    Years since quitting: 44.6  . Smokeless tobacco: Never Used  Substance and Sexual Activity  . Alcohol use: Yes     Alcohol/week: 0.0 standard drinks    Comment: Special occasions  . Drug use: No  . Sexual activity: Yes    Birth control/protection: Post-menopausal  Other Topics Concern  . Not on file  Social History Narrative  . Not on file   Social Determinants of Health   Financial Resource Strain:   . Difficulty of Paying Living Expenses: Not on file  Food Insecurity:   . Worried About Charity fundraiser in the Last Year: Not on file  . Ran Out of Food in the Last Year: Not on file  Transportation Needs:   . Lack of Transportation (Medical): Not on file  . Lack of Transportation (Non-Medical): Not on file  Physical Activity:   . Days of Exercise per Week: Not on file  . Minutes of Exercise per Session: Not on file  Stress:   . Feeling of Stress : Not on file  Social Connections:   . Frequency of Communication with Friends and Family: Not on file  . Frequency of Social Gatherings with Friends and Family: Not on file  . Attends Religious Services: Not on file  . Active Member of Clubs or Organizations: Not on file  . Attends Archivist Meetings: Not on file  . Marital Status: Not on file     Family History: The patient's family history includes Diabetes in her maternal grandmother; Heart disease in her brother, brother, father, mother, and paternal grandfather; Hyperlipidemia in her brother, brother, and mother; Hypertension in her brother, brother, father, and mother; Lung cancer in her father; Mental illness in her brother; Stroke in her maternal grandfather and paternal grandmother. ROS:   Please see the history of present illness.    All 14 point review of systems negative except as described per history of present illness  EKGs/Labs/Other Studies Reviewed:      Recent Labs: No results found for requested labs within last 8760 hours.  Recent Lipid Panel No results found for: CHOL, TRIG, HDL, CHOLHDL, VLDL, LDLCALC, LDLDIRECT  Physical Exam:    VS:  BP 126/68    Pulse 72   Ht 5\' 2"  (1.575 m)   Wt 215 lb 12.8 oz (97.9 kg)   SpO2 96%   BMI 39.47 kg/m     Wt Readings from Last 3 Encounters:  10/29/19 215 lb 12.8 oz (97.9 kg)  05/23/19 213 lb (96.6 kg)  12/26/18 205 lb (93 kg)     GEN:  Well nourished, well developed in no acute distress HEENT: Normal NECK: No JVD; No carotid bruits LYMPHATICS: No lymphadenopathy CARDIAC: RRR, no murmurs, no rubs, no gallops RESPIRATORY:  Clear to auscultation without rales,  wheezing or rhonchi  ABDOMEN: Soft, non-tender, non-distended MUSCULOSKELETAL:  No edema; No deformity  SKIN: Warm and dry LOWER EXTREMITIES: no swelling NEUROLOGIC:  Alert and oriented x 3 PSYCHIATRIC:  Normal affect   ASSESSMENT:    1. Status post coronary artery bypass graft   2. Left bundle branch block   3. Ischemic cardiomyopathy   4. Hypertensive heart disease    5. Coronary artery disease involving native coronary artery of native heart without angina pectoris    PLAN:    In order of problems listed above:  1. Status post coronary bypass graft stable on appropriate medication doing well asymptomatic 2. Left bundle branch block still existent.  EKG showed left bundle branch block today.  Overall doing well asymptomatic stable. 3. Ischemic cardiomyopathy.  On appropriate medications which I will continue that include Toprol-XL as well as Hyzaar.  We will continue.  We will schedule her to have echocardiogram next time when I see her 4. Hypertensive heart disease stable from that point review 5. Dyslipidemia on high intensity statin, Zetia as well we will continue will get fasting lipid profile from primary care physician.   Medication Adjustments/Labs and Tests Ordered: Current medicines are reviewed at length with the patient today.  Concerns regarding medicines are outlined above.  No orders of the defined types were placed in this encounter.  Medication changes: No orders of the defined types were placed in this  encounter.   Signed, Park Liter, MD, [facility] 10/29/2019 1:46 PM    [place]

## 2020-02-06 ENCOUNTER — Telehealth: Payer: Self-pay | Admitting: Hematology and Oncology

## 2020-02-06 NOTE — Telephone Encounter (Signed)
Rescheduled appt on 6/18 to 6/24. Provider on PAL. Unable to reach pt. Left voicemail.

## 2020-02-22 ENCOUNTER — Ambulatory Visit: Payer: Medicare Other | Admitting: Hematology and Oncology

## 2020-02-27 NOTE — Progress Notes (Signed)
Patient Care Team: Alyssa Hatchet, MD as PCP - General (Internal Medicine) Alyssa Reedy, MD as Consulting Physician (Cardiology) Alyssa Kussmaul, MD as Consulting Physician (General Surgery) Alyssa Lose, MD as Consulting Physician (Hematology and Oncology) Alyssa Pray, MD as Consulting Physician (Radiation Oncology)  DIAGNOSIS:    ICD-10-CM   1. Ductal carcinoma in situ (DCIS) of left breast  D05.12     SUMMARY OF ONCOLOGIC HISTORY: Oncology History  Ductal carcinoma in situ (DCIS) of left breast  07/17/2018 Initial Diagnosis   Screening detected left breast mass 11 o'clock position 1.6 cm with irregular margins axilla negative biopsy revealed intermediate grade DCIS ER 100%, PR 90%, Tis NX stage 0   08/18/2018 Surgery   Left lumpectomy: Grade 2 DCIS, 2.3 cm, margins negative, ER 100%, PR 90%, no evidence of necrosis Tis NX stage 0   09/18/2018 -  Anti-estrogen oral therapy   Tamoxifen 20mg  daily, switched to anastrozole on 12/28/18 due to ringing in her ears, plan for 5 years     CHIEF COMPLIANT: Follow-up of left breast DCIS on anastrozole   INTERVAL HISTORY: Alyssa Cardenas is a 79 y.o. with above-mentioned history of DCIS of the left breast treated with lumpectomy and who is currently on anti-estrogen therapy with anastrozole, after tamoxifen led to severe ringing in her ears. Mammogram on 07/03/19 showed no evidence of malignancy bilaterally. She presents to the clinic today for follow-up.   She has had an excellent year.  She has not had any major health issues.  Denies any lumps or nodules in the breast.  Mammograms in October were normal.  ALLERGIES:  has No Known Allergies.  MEDICATIONS:  Current Outpatient Medications  Medication Sig Dispense Refill  . amLODipine (NORVASC) 10 MG tablet Take 10 mg by mouth daily.     Marland Kitchen anastrozole (ARIMIDEX) 1 MG tablet TAKE 1 TABLET(1 MG) BY MOUTH DAILY 90 tablet 3  . aspirin EC 81 MG tablet Take 81 mg by mouth daily.    .  Cholecalciferol (VITAMIN D3) 1.25 MG (50000 UT) CAPS Take 1 tablet by mouth once a week.    . dimenhyDRINATE (DRAMAMINE) 50 MG tablet Take 50 mg by mouth daily as needed for dizziness.     . ezetimibe (ZETIA) 10 MG tablet Take 1 tablet (10 mg total) by mouth daily. 30 tablet 10  . fluticasone (FLONASE) 50 MCG/ACT nasal spray Place 2 sprays into both nostrils daily as needed for allergies or rhinitis.    Javier Docker Oil 350 MG CAPS Take 350 mg by mouth daily.    Marland Kitchen loratadine (CLARITIN) 10 MG tablet Take 10 mg by mouth daily as needed for allergies.     Marland Kitchen losartan-hydrochlorothiazide (HYZAAR) 100-25 MG per tablet Take 0.5 tablets by mouth daily.     . metoprolol succinate (TOPROL-XL) 25 MG 24 hr tablet Take 25 mg by mouth daily.    . nitroGLYCERIN (NITROSTAT) 0.4 MG SL tablet Place 0.4 mg under the tongue every 5 (five) minutes as needed for chest pain.    . Polyvinyl Alcohol-Povidone PF (REFRESH) 1.4-0.6 % SOLN Place 2 drops into both eyes daily as needed (for dry eyes).    . potassium chloride (KLOR-CON) 20 MEQ packet Take 20 mEq by mouth daily.     . rosuvastatin (CRESTOR) 40 MG tablet Take 40 mg by mouth every evening.      No current facility-administered medications for this visit.    PHYSICAL EXAMINATION: ECOG PERFORMANCE STATUS: 1 - Symptomatic but completely  ambulatory  Vitals:   02/28/20 0809  BP: (!) 108/57  Pulse: 74  Resp: 16  Temp: 98.3 F (36.8 C)  SpO2: 98%   Filed Weights   02/28/20 0809  Weight: 207 lb 8 oz (94.1 kg)    BREAST: No palpable masses or nodules in either right or left breasts. No palpable axillary supraclavicular or infraclavicular adenopathy no breast tenderness or nipple discharge. (exam performed in the presence of a chaperone)  LABORATORY DATA:  I have reviewed the data as listed CMP Latest Ref Rng & Units 08/14/2018 07/26/2018  Glucose 70 - 99 mg/dL 82 125(H)  BUN 8 - 23 mg/dL 18 15  Creatinine 0.44 - 1.00 mg/dL 1.10(H) 1.21(H)  Sodium 135 - 145  mmol/L 139 141  Potassium 3.5 - 5.1 mmol/L 3.6 3.2(L)  Chloride 98 - 111 mmol/L 104 105  CO2 22 - 32 mmol/L 24 26  Calcium 8.9 - 10.3 mg/dL 9.3 9.8  Total Protein 6.5 - 8.1 g/dL - 7.4  Total Bilirubin 0.3 - 1.2 mg/dL - 0.5  Alkaline Phos 38 - 126 U/L - 52  AST 15 - 41 U/L - 23  ALT 0 - 44 U/L - 18    Lab Results  Component Value Date   WBC 5.2 08/14/2018   HGB 12.5 08/14/2018   HCT 42.2 08/14/2018   MCV 89.2 08/14/2018   PLT 235 08/14/2018   NEUTROABS 2.2 07/26/2018    ASSESSMENT & PLAN:  Ductal carcinoma in situ (DCIS) of left breast 08/18/2018:Left lumpectomy: Grade 2 DCIS, 2.3 cm, margins negative, ER 100%, PR 90%, no evidence of necrosis Tis NX stage 0 Patient was provided option for radiation versus antiestrogen therapy. Patient preferred antiestrogens.  Current treatment: Tamoxifen 20 mg daily started 12/27/2019held 11/22/2018 for tinnitus, switched to anastrozole 11/22/2018.  Anastrozole toxicities: Ringing in ears improved  Occ Hot flashes: Slightly worse than before Joint pains: Could be related to her cholesterol medication as well.  I renewed her anastrozole for another year  Breast cancer surveillance:  Mammogram 07/02/2020: Benign Breast Exam: 02/28/2020 Benign  Return to clinic in 1 year for follow-up    No orders of the defined types were placed in this encounter.  The patient has a good understanding of the overall plan. she agrees with it. she will call with any problems that may develop before the next visit here.  Total time spent: 20 mins including face to face time and time spent for planning, charting and coordination of care  Alyssa Lose, MD 02/28/2020  I, Cloyde Reams Dorshimer, am acting as scribe for Dr. Nicholas Cardenas.  I have reviewed the above documentation for accuracy and completeness, and I agree with the above.

## 2020-02-28 ENCOUNTER — Inpatient Hospital Stay: Payer: Medicare Other | Attending: Hematology and Oncology | Admitting: Hematology and Oncology

## 2020-02-28 ENCOUNTER — Telehealth: Payer: Self-pay | Admitting: Hematology and Oncology

## 2020-02-28 ENCOUNTER — Other Ambulatory Visit: Payer: Self-pay

## 2020-02-28 DIAGNOSIS — D0512 Intraductal carcinoma in situ of left breast: Secondary | ICD-10-CM | POA: Diagnosis not present

## 2020-02-28 DIAGNOSIS — Z79811 Long term (current) use of aromatase inhibitors: Secondary | ICD-10-CM | POA: Insufficient documentation

## 2020-02-28 DIAGNOSIS — Z170 Estrogen receptor positive status [ER+]: Secondary | ICD-10-CM | POA: Insufficient documentation

## 2020-02-28 DIAGNOSIS — Z7982 Long term (current) use of aspirin: Secondary | ICD-10-CM | POA: Insufficient documentation

## 2020-02-28 DIAGNOSIS — R232 Flushing: Secondary | ICD-10-CM | POA: Diagnosis not present

## 2020-02-28 DIAGNOSIS — Z79899 Other long term (current) drug therapy: Secondary | ICD-10-CM | POA: Insufficient documentation

## 2020-02-28 MED ORDER — ANASTROZOLE 1 MG PO TABS: ORAL_TABLET | ORAL | 3 refills | Status: DC

## 2020-02-28 NOTE — Assessment & Plan Note (Signed)
08/18/2018:Left lumpectomy: Grade 2 DCIS, 2.3 cm, margins negative, ER 100%, PR 90%, no evidence of necrosis Tis NX stage 0 Patient was provided option for radiation versus antiestrogen therapy. Patient preferred antiestrogens.  Current treatment: Tamoxifen 20 mg daily started 12/27/2019held 11/22/2018 for tinnitus, switched to anastrozole 11/22/2018.  Anastrozole toxicities: Ringing in ears improved  Occ Hot flashes Joint pains: Not having an issues with that  Sent a years worth of refills  Breast cancer surveillance:  Mammogram 07/02/2020: Benign Breast Exam: 02/28/2020 Benign  Return to clinic in 1 year for follow-up

## 2020-02-28 NOTE — Telephone Encounter (Signed)
Scheduled appts per 6/24 los. Gave pt a print out of AVS.  

## 2020-04-03 ENCOUNTER — Other Ambulatory Visit: Payer: Self-pay | Admitting: Cardiology

## 2020-04-21 IMAGING — MG DIGITAL SCREENING BILATERAL MAMMOGRAM WITH TOMO AND CAD
8 series · 8 of 24 positions shown · non-contrast
Comparison: Previous exam(s).

CLINICAL DATA: Screening.

EXAM:
DIGITAL SCREENING BILATERAL MAMMOGRAM WITH TOMO AND CAD

[L MLO synth-2D]
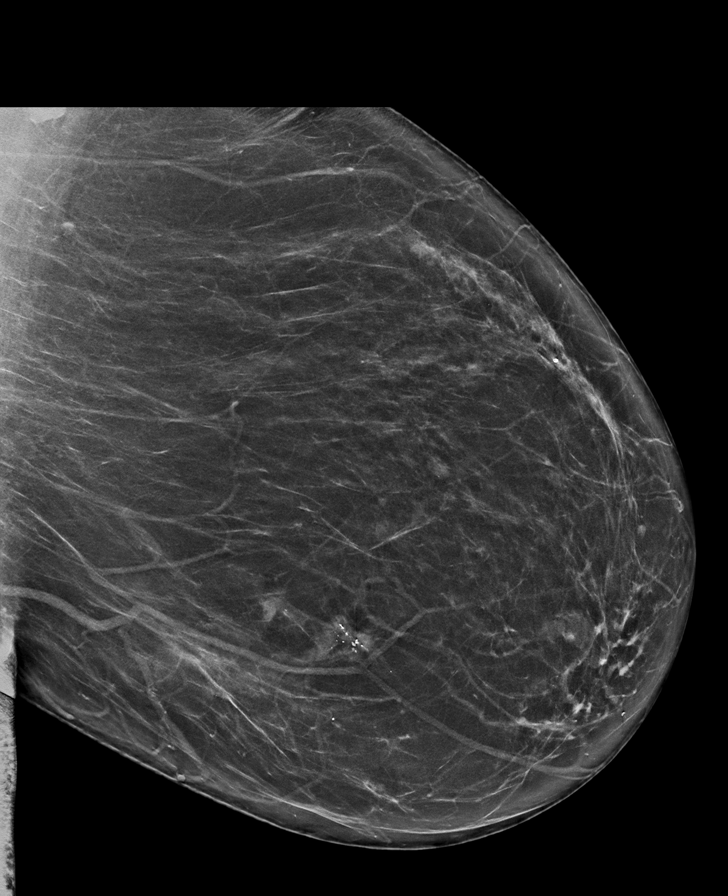

[R CC synth-2D]
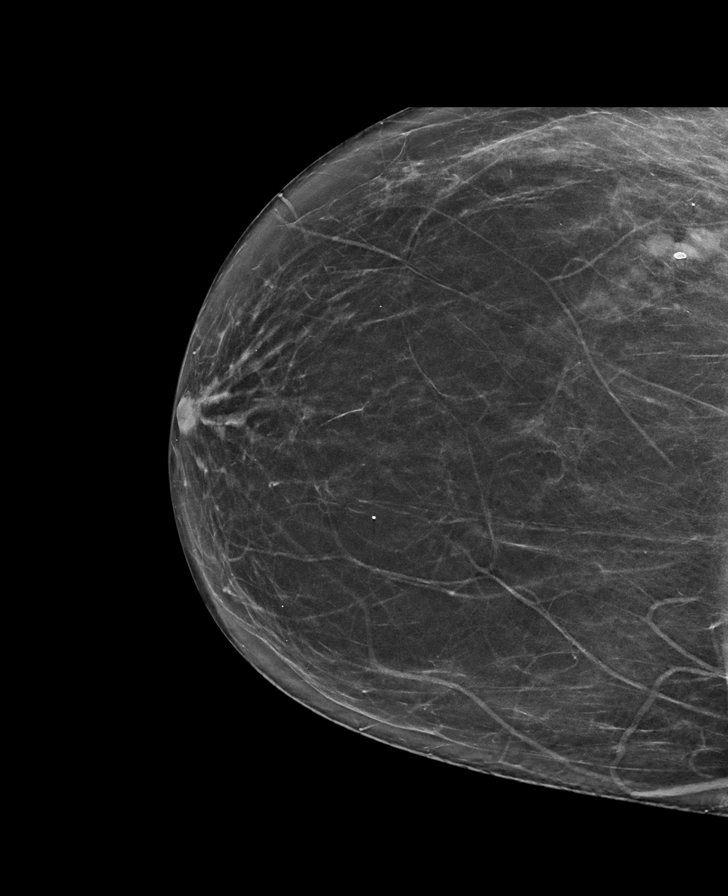

[R MLO synth-2D]
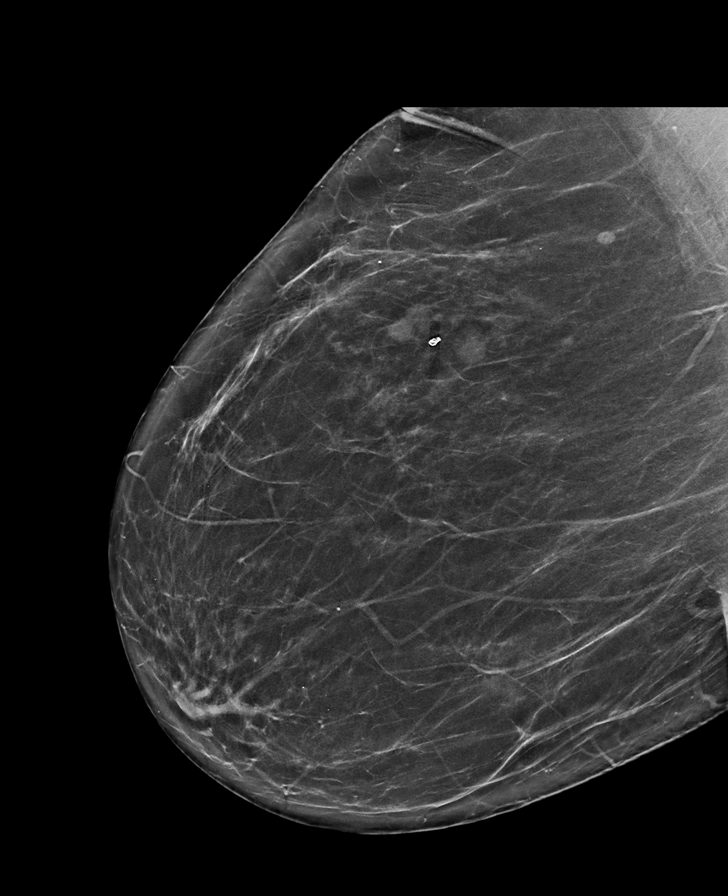

[L CC synth-2D]
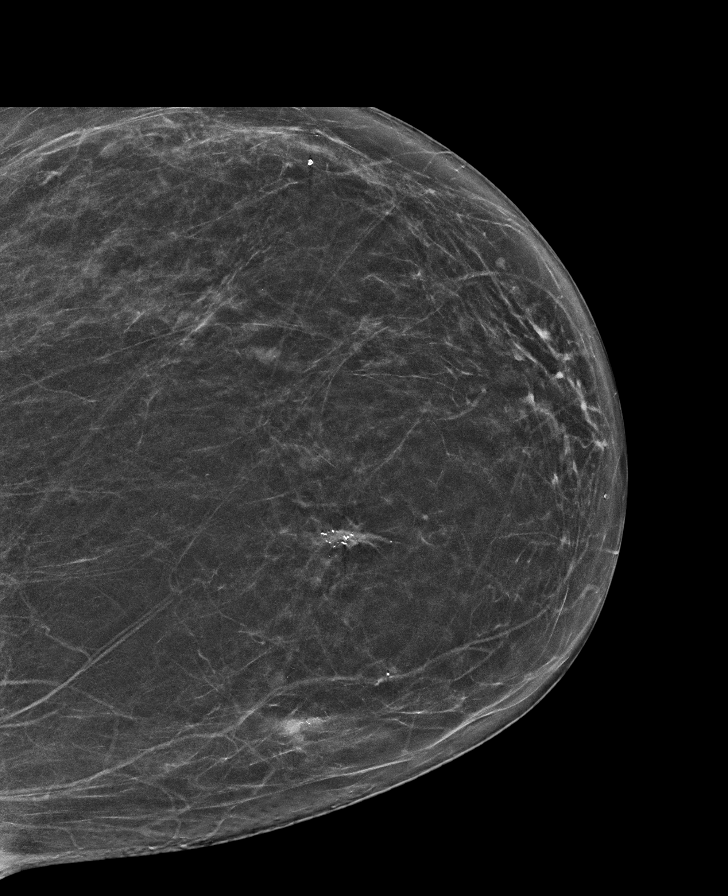

[L CC tomo · tomo slice 33/66.0]
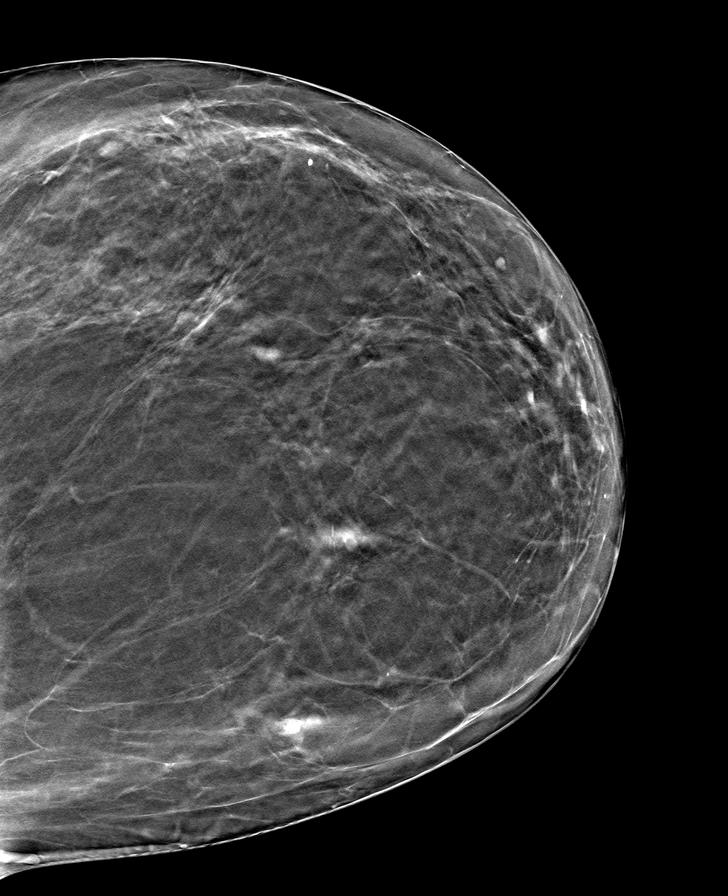

[R CC tomo · tomo slice 35/69.0]
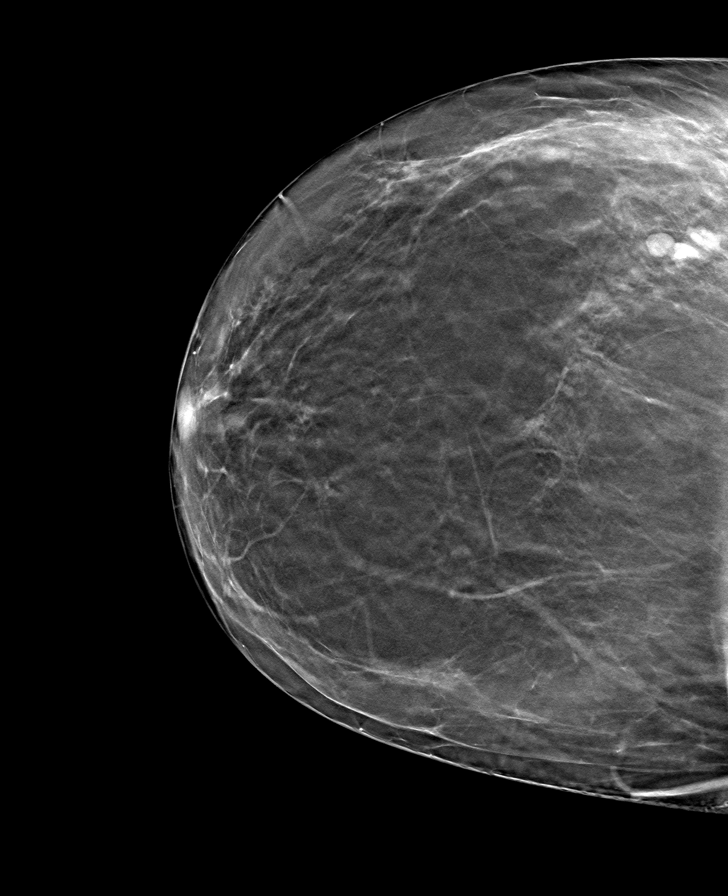

[L MLO tomo · tomo slice 42/83.0]
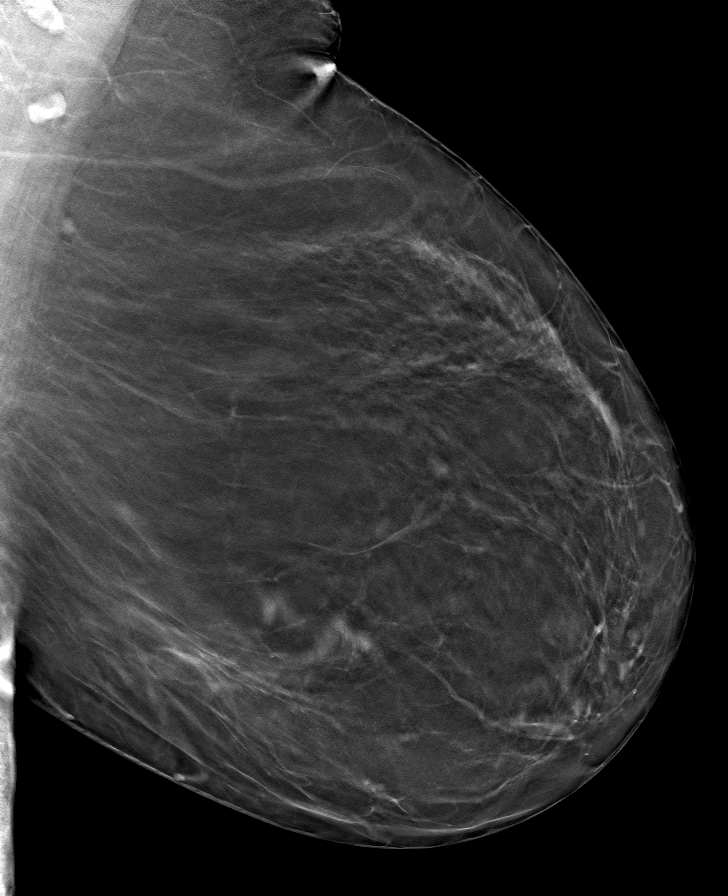

[R MLO tomo · tomo slice 41/82.0]
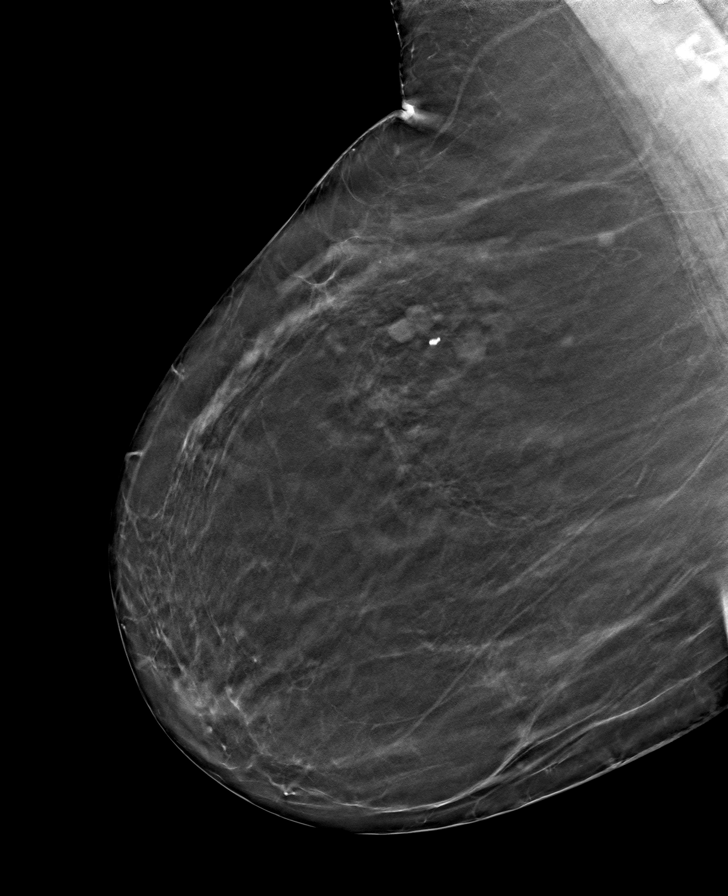

[8 of 24 positions shown; findings below may reference images not displayed]

ACR Breast Density Category b: There are scattered areas of
fibroglandular density.
FINDINGS: In the left breast, a possible mass warrants further evaluation. In
the right breast, no findings suspicious for malignancy.

Images were processed with CAD.
IMPRESSION: Further evaluation is suggested for possible mass in the left
breast.

RECOMMENDATION:
Diagnostic mammogram and possibly ultrasound of the left breast.
(Code:[id number]-[id number])

The patient will be contacted regarding the findings, and additional
imaging will be scheduled.

BI-RADS CATEGORY  0: Incomplete. Need additional imaging evaluation
and/or prior mammograms for comparison.

## 2020-05-07 ENCOUNTER — Ambulatory Visit (INDEPENDENT_AMBULATORY_CARE_PROVIDER_SITE_OTHER): Payer: Medicare Other | Admitting: Cardiology

## 2020-05-07 ENCOUNTER — Encounter: Payer: Self-pay | Admitting: Cardiology

## 2020-05-07 ENCOUNTER — Other Ambulatory Visit: Payer: Self-pay

## 2020-05-07 VITALS — BP 116/64 | HR 62 | Ht 62.0 in | Wt 209.0 lb

## 2020-05-07 DIAGNOSIS — I447 Left bundle-branch block, unspecified: Secondary | ICD-10-CM | POA: Diagnosis not present

## 2020-05-07 DIAGNOSIS — I255 Ischemic cardiomyopathy: Secondary | ICD-10-CM

## 2020-05-07 DIAGNOSIS — Z951 Presence of aortocoronary bypass graft: Secondary | ICD-10-CM | POA: Diagnosis not present

## 2020-05-07 DIAGNOSIS — E782 Mixed hyperlipidemia: Secondary | ICD-10-CM

## 2020-05-07 DIAGNOSIS — I119 Hypertensive heart disease without heart failure: Secondary | ICD-10-CM | POA: Diagnosis not present

## 2020-05-07 MED ORDER — NITROGLYCERIN 0.4 MG SL SUBL: 0.4000 mg | SUBLINGUAL_TABLET | SUBLINGUAL | 5 refills | Status: DC | PRN

## 2020-05-07 NOTE — Patient Instructions (Signed)
Medication Instructions:  °Your physician recommends that you continue on your current medications as directed. Please refer to the Current Medication list given to you today. ° °*If you need a refill on your cardiac medications before your next appointment, please call your pharmacy* ° ° °Lab Work: °None. ° °If you have labs (blood work) drawn today and your tests are completely normal, you will receive your results only by: °• MyChart Message (if you have MyChart) OR °• A paper copy in the mail °If you have any lab test that is abnormal or we need to change your treatment, we will call you to review the results. ° ° °Testing/Procedures: °Your physician has requested that you have an echocardiogram. Echocardiography is a painless test that uses sound waves to create images of your heart. It provides your doctor with information about the size and shape of your heart and how well your heart’s chambers and valves are working. This procedure takes approximately one hour. There are no restrictions for this procedure. ° ° ° ° °Follow-Up: °At [facility], you and your health needs are our priority.  As part of our continuing mission to provide you with exceptional heart care, we have created designated Provider Care Teams.  These Care Teams include your primary Cardiologist (physician) and Advanced Practice Providers (APPs -  Physician Assistants and Nurse Practitioners) who all work together to provide you with the care you need, when you need it. ° °We recommend signing up for the patient portal called "MyChart".  Sign up information is provided on this After Visit Summary.  MyChart is used to connect with patients for Virtual Visits (Telemedicine).  Patients are able to view lab/test results, encounter notes, upcoming appointments, etc.  Non-urgent messages can be sent to your provider as well.   °To learn more about what you can do with MyChart, go to [email or web address].   ° °Your next appointment:   °6  month(s) ° °The format for your next appointment:   °In Person ° °Provider:   °Robert Krasowski, MD ° ° °Other Instructions ° ° °Echocardiogram °An echocardiogram is a procedure that uses painless sound waves (ultrasound) to produce an image of the heart. Images from an echocardiogram can provide important information about: °· Signs of coronary artery disease (CAD). °· Aneurysm detection. An aneurysm is a weak or damaged part of an artery wall that bulges out from the normal force of blood pumping through the body. °· Heart size and shape. Changes in the size or shape of the heart can be associated with certain conditions, including heart failure, aneurysm, and CAD. °· Heart muscle function. °· Heart valve function. °· Signs of a past heart attack. °· Fluid buildup around the heart. °· Thickening of the heart muscle. °· A tumor or infectious growth around the heart valves. °Tell a health care provider about: °· Any allergies you have. °· All medicines you are taking, including vitamins, herbs, eye drops, creams, and over-the-counter medicines. °· Any blood disorders you have. °· Any surgeries you have had. °· Any medical conditions you have. °· Whether you are pregnant or may be pregnant. °What are the risks? °Generally, this is a safe procedure. However, problems may occur, including: °· Allergic reaction to dye (contrast) that may be used during the procedure. °What happens before the procedure? °No specific preparation is needed. You may eat and drink normally. °What happens during the procedure? ° °· An IV tube may be inserted into one of your veins. °· You may   receive contrast through this tube. A contrast is an injection that improves the quality of the pictures from your heart. °· A gel will be applied to your chest. °· A wand-like tool (transducer) will be moved over your chest. The gel will help to transmit the sound waves from the transducer. °· The sound waves will harmlessly bounce off of your heart to  allow the heart images to be captured in real-time motion. The images will be recorded on a computer. °The procedure may vary among health care providers and hospitals. °What happens after the procedure? °· You may return to your normal, everyday life, including diet, activities, and medicines, unless your health care provider tells you not to do that. °Summary °· An echocardiogram is a procedure that uses painless sound waves (ultrasound) to produce an image of the heart. °· Images from an echocardiogram can provide important information about the size and shape of your heart, heart muscle function, heart valve function, and fluid buildup around your heart. °· You do not need to do anything to prepare before this procedure. You may eat and drink normally. °· After the echocardiogram is completed, you may return to your normal, everyday life, unless your health care provider tells you not to do that. °This information is not intended to replace advice given to you by your health care provider. Make sure you discuss any questions you have with your health care provider. °Document Revised: 12/14/2018 Document Reviewed: 09/25/2016 °Elsevier Patient Education © 2020 [employer] ° ° °

## 2020-05-07 NOTE — Progress Notes (Signed)
Cardiology Office Note:    Date:  05/07/2020   ID:  Alyssa Cardenas, DOB [birth year], MRN [id number]  PCP:  Velna Hatchet, MD  Cardiologist:  Jenne Campus, MD    Referring MD: Velna Hatchet, MD   Chief Complaint  Patient presents with  . Follow-up  I am doing fine  History of Present Illness:    Alyssa Cardenas is a 79 y.o. female with past medical history significant for coronary artery bypass graft that was done in 1992, left bundle branch block, history of myocardial infarction, dyslipidemia.  Comes today to my office for follow-up.  Overall she seems to be doing fine she still walk around does things that she wants denies having any chest pain tightness squeezing pressure burning chest.  There is no swelling of lower extremities.  She is very happy and cheerful.  Past Medical History:  Diagnosis Date  . Anxiety   . CAD (coronary artery disease), native coronary artery    Cath 08/27/1991 Normal Left main, calcified proximal LAD, 50% stenosis proximal LAD, 90% stenosis mid LAD, occluded Diag 1, 95% stenosis proximal Diag 2, 95% stenosis proximal CFX, 95% stenosis proximal OM 1, occluded RCA;  CABG w LIMA to LAD, SVG to dx, SVG to OM1-2-circ, SVG to PDA 08/29/1991 Dr. Redmond Pulling    . Cancer ([place])   . Chronic kidney disease   . History of colonic polyps 03/10/2015  . 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 Hyperlipidemia   . Hypertensive heart disease    . Myocardial infarction ([place])   . Obesity (BMI 30-39.9)   . Vitamin D deficiency 03/10/2015    Past Surgical History:  Procedure Laterality Date  . BREAST BIOPSY    . BREAST EXCISIONAL BIOPSY Bilateral   . BREAST LUMPECTOMY Left    2019 DCIS  . BREAST LUMPECTOMY WITH RADIOACTIVE SEED LOCALIZATION Left 08/18/2018   Procedure: LEFT BREAST LUMPECTOMY WITH RADIOACTIVE SEED LOCALIZATION;  Surgeon: Jovita Kussmaul, MD;  Location: [place];  Service: General;  Laterality: Left;  .  CORONARY ARTERY BYPASS GRAFT  1992  . EYE SURGERY     cataract removal 2017  . TONSILLECTOMY      Current Medications: Current Meds  Medication Sig  . amLODipine (NORVASC) 10 MG tablet Take 10 mg by mouth daily.   Marland Kitchen anastrozole (ARIMIDEX) 1 MG tablet TAKE 1 TABLET(1 MG) BY MOUTH DAILY  . aspirin EC 81 MG tablet Take 81 mg by mouth daily.  . Cholecalciferol (VITAMIN D3) 1.25 MG (50000 UT) CAPS Take 1 tablet by mouth once a week.  . dimenhyDRINATE (DRAMAMINE) 50 MG tablet Take 50 mg by mouth daily as needed for dizziness.   . ezetimibe (ZETIA) 10 MG tablet TAKE 1 TABLET(10 MG) BY MOUTH DAILY  . fluticasone (FLONASE) 50 MCG/ACT nasal spray Place 2 sprays into both nostrils daily as needed for allergies or rhinitis.  Javier Docker Oil 350 MG CAPS Take 350 mg by mouth daily.  Marland Kitchen loratadine (CLARITIN) 10 MG tablet Take 10 mg by mouth daily as needed for allergies.   Marland Kitchen losartan-hydrochlorothiazide (HYZAAR) 100-25 MG per tablet Take 0.5 tablets by mouth daily.   . metoprolol succinate (TOPROL-XL) 25 MG 24 hr tablet Take 25 mg by mouth daily.  . nitroGLYCERIN (NITROSTAT) 0.4 MG SL tablet Place 0.4 mg under the tongue every 5 (five) minutes as needed for chest pain.  . Polyvinyl Alcohol-Povidone PF (REFRESH) 1.4-0.6 %  SOLN Place 2 drops into both eyes daily as needed (for dry eyes).  . potassium chloride (KLOR-CON) 20 MEQ packet Take 20 mEq by mouth daily.   . rosuvastatin (CRESTOR) 40 MG tablet Take 40 mg by mouth every evening.      Allergies:   Patient has no known allergies.   Social History   Socioeconomic History  . Marital status: Married    Spouse name: Not on file  . Number of children: Not on file  . Years of education: Not on file  . Highest education level: Not on file  Occupational History  . Not on file  Tobacco Use  . Smoking status: Former Smoker    Quit date: 03/10/1975    Years since quitting: 45.1  . Smokeless tobacco: Never Used  Vaping Use  . Vaping Use: Never used    Substance and Sexual Activity  . Alcohol use: Yes    Alcohol/week: 0.0 standard drinks    Comment: Special occasions  . Drug use: No  . Sexual activity: Yes    Birth control/protection: Post-menopausal  Other Topics Concern  . Not on file  Social History Narrative  . Not on file   Social Determinants of Health   Financial Resource Strain:   . Difficulty of Paying Living Expenses: Not on file  Food Insecurity:   . Worried About Charity fundraiser in the Last Year: Not on file  . Ran Out of Food in the Last Year: Not on file  Transportation Needs:   . Lack of Transportation (Medical): Not on file  . Lack of Transportation (Non-Medical): Not on file  Physical Activity:   . Days of Exercise per Week: Not on file  . Minutes of Exercise per Session: Not on file  Stress:   . Feeling of Stress : Not on file  Social Connections:   . Frequency of Communication with Friends and Family: Not on file  . Frequency of Social Gatherings with Friends and Family: Not on file  . Attends Religious Services: Not on file  . Active Member of Clubs or Organizations: Not on file  . Attends Archivist Meetings: Not on file  . Marital Status: Not on file     Family History: The patient's family history includes Diabetes in her maternal grandmother; Heart disease in her brother, brother, father, mother, and paternal grandfather; Hyperlipidemia in her brother, brother, and mother; Hypertension in her brother, brother, father, and mother; Lung cancer in her father; Mental illness in her brother; Stroke in her maternal grandfather and paternal grandmother. ROS:   Please see the history of present illness.    All 14 point review of systems negative except as described per history of present illness  EKGs/Labs/Other Studies Reviewed:      Recent Labs: No results found for requested labs within last 8760 hours.  Recent Lipid Panel No results found for: CHOL, TRIG, HDL, CHOLHDL, VLDL,  LDLCALC, LDLDIRECT  Physical Exam:    VS:  BP 116/64 (BP Location: Left Arm, Patient Position: Sitting, Cuff Size: Large)   Pulse 62   Ht 5\' 2"  (1.575 m)   Wt 209 lb (94.8 kg)   SpO2 93%   BMI 38.23 kg/m     Wt Readings from Last 3 Encounters:  05/07/20 209 lb (94.8 kg)  02/28/20 207 lb 8 oz (94.1 kg)  10/29/19 215 lb 12.8 oz (97.9 kg)     GEN:  Well nourished, well developed in no acute distress HEENT:  Normal NECK: No JVD; No carotid bruits LYMPHATICS: No lymphadenopathy CARDIAC: RRR, no murmurs, no rubs, no gallops RESPIRATORY:  Clear to auscultation without rales, wheezing or rhonchi  ABDOMEN: Soft, non-tender, non-distended MUSCULOSKELETAL:  No edema; No deformity  SKIN: Warm and dry LOWER EXTREMITIES: no swelling NEUROLOGIC:  Alert and oriented x 3 PSYCHIATRIC:  Normal affect   ASSESSMENT:    1. Status post coronary artery bypass graft   2. Mixed hyperlipidemia   3. Left bundle branch block   4. Hypertensive heart disease    5. Ischemic cardiomyopathy    PLAN:    In order of problems listed above:  1. Status post coronary bypass graft many years ago.  Asymptomatic.  Last estimation of her coronary artery status was done in December 2019 showing no evidence of ischemia chest old MI. 2. Mixed dyslipidemia: She is scheduled to see her primary care physician she will have fasting lipid profile done.  Will wait for results of it.  In the meantime she is on high intensity statin in form of Crestor 40 as well as Zetia.  We will continue that management. 3. Left bundle branch block: Chronic. 4. Hypertensive heart disease.  Blood pressure is normal.  Continue present management. 5. Ischemic cardiomyopathy we will recheck a left ventricle ejection fraction if her ejection fraction is still significantly reduced which I suspect is the case we will consider switching her to [facility].   Medication Adjustments/Labs and Tests Ordered: Current medicines are reviewed at length  with the patient today.  Concerns regarding medicines are outlined above.  No orders of the defined types were placed in this encounter.  Medication changes: No orders of the defined types were placed in this encounter.   Signed, Park Liter, MD, [facility] 05/07/2020 2:35 PM    [place]

## 2020-05-07 NOTE — Addendum Note (Signed)
Addended by: Ashok Norris on: 05/07/2020 02:42 PM   Modules accepted: Orders

## 2020-05-16 ENCOUNTER — Ambulatory Visit (HOSPITAL_COMMUNITY)
Admission: RE | Admit: 2020-05-16 | Discharge: 2020-05-16 | Disposition: A | Discharge status: Home / Self Care | Payer: Medicare Other | Source: Ambulatory Visit | Attending: Cardiology | Admitting: Cardiology

## 2020-05-16 ENCOUNTER — Other Ambulatory Visit: Payer: Self-pay

## 2020-05-16 DIAGNOSIS — E785 Hyperlipidemia, unspecified: Secondary | ICD-10-CM | POA: Diagnosis not present

## 2020-05-16 DIAGNOSIS — I340 Nonrheumatic mitral (valve) insufficiency: Secondary | ICD-10-CM | POA: Diagnosis not present

## 2020-05-16 DIAGNOSIS — I2510 Atherosclerotic heart disease of native coronary artery without angina pectoris: Secondary | ICD-10-CM | POA: Insufficient documentation

## 2020-05-16 DIAGNOSIS — I252 Old myocardial infarction: Secondary | ICD-10-CM | POA: Insufficient documentation

## 2020-05-16 DIAGNOSIS — I255 Ischemic cardiomyopathy: Secondary | ICD-10-CM | POA: Insufficient documentation

## 2020-05-16 LAB — ECHOCARDIOGRAM COMPLETE
Area-P 1/2: 1.38 cm2
Calc EF: 47.9 %
S' Lateral: 4.3 cm
Single Plane A2C EF: 45.6 %
Single Plane A4C EF: 48.7 %

## 2020-05-16 NOTE — Progress Notes (Signed)
  Echocardiogram 2D Echocardiogram has been performed.  Alyssa Cardenas 05/16/2020, 1:46 PM

## 2020-05-19 ENCOUNTER — Telehealth: Payer: Self-pay | Admitting: Cardiology

## 2020-05-19 NOTE — Telephone Encounter (Signed)
Follw Up:       Returning your call.

## 2020-05-19 NOTE — Telephone Encounter (Signed)
Called patient back informed her of results. No further questions.  

## 2020-05-23 ENCOUNTER — Other Ambulatory Visit: Payer: Self-pay | Admitting: Hematology and Oncology

## 2020-05-23 DIAGNOSIS — Z9889 Other specified postprocedural states: Secondary | ICD-10-CM

## 2020-05-23 DIAGNOSIS — Z853 Personal history of malignant neoplasm of breast: Secondary | ICD-10-CM

## 2020-07-03 ENCOUNTER — Other Ambulatory Visit: Payer: Self-pay

## 2020-07-03 ENCOUNTER — Ambulatory Visit
Admission: RE | Admit: 2020-07-03 | Discharge: 2020-07-03 | Disposition: A | Discharge status: Home / Self Care | Payer: Medicare Other | Source: Ambulatory Visit | Attending: Hematology and Oncology | Admitting: Hematology and Oncology

## 2020-07-03 DIAGNOSIS — Z9889 Other specified postprocedural states: Secondary | ICD-10-CM

## 2020-07-03 DIAGNOSIS — Z853 Personal history of malignant neoplasm of breast: Secondary | ICD-10-CM

## 2020-07-03 HISTORY — DX: Malignant neoplasm of unspecified site of unspecified female breast: C50.919

## 2020-08-07 ENCOUNTER — Other Ambulatory Visit: Payer: Self-pay | Admitting: Internal Medicine

## 2020-08-07 DIAGNOSIS — R5381 Other malaise: Secondary | ICD-10-CM

## 2020-11-03 ENCOUNTER — Other Ambulatory Visit: Payer: Self-pay

## 2020-11-03 DIAGNOSIS — C50919 Malignant neoplasm of unspecified site of unspecified female breast: Secondary | ICD-10-CM | POA: Insufficient documentation

## 2020-11-03 DIAGNOSIS — I219 Acute myocardial infarction, unspecified: Secondary | ICD-10-CM | POA: Insufficient documentation

## 2020-11-03 DIAGNOSIS — C801 Malignant (primary) neoplasm, unspecified: Secondary | ICD-10-CM | POA: Insufficient documentation

## 2020-11-03 DIAGNOSIS — F419 Anxiety disorder, unspecified: Secondary | ICD-10-CM | POA: Insufficient documentation

## 2020-11-03 DIAGNOSIS — N189 Chronic kidney disease, unspecified: Secondary | ICD-10-CM | POA: Insufficient documentation

## 2020-11-10 ENCOUNTER — Ambulatory Visit (INDEPENDENT_AMBULATORY_CARE_PROVIDER_SITE_OTHER): Payer: Medicare Other | Admitting: Cardiology

## 2020-11-10 ENCOUNTER — Encounter: Payer: Self-pay | Admitting: Cardiology

## 2020-11-10 ENCOUNTER — Other Ambulatory Visit: Payer: Self-pay

## 2020-11-10 VITALS — BP 120/78 | HR 69 | Ht 60.0 in | Wt 208.0 lb

## 2020-11-10 DIAGNOSIS — I119 Hypertensive heart disease without heart failure: Secondary | ICD-10-CM

## 2020-11-10 DIAGNOSIS — I447 Left bundle-branch block, unspecified: Secondary | ICD-10-CM | POA: Diagnosis not present

## 2020-11-10 DIAGNOSIS — I255 Ischemic cardiomyopathy: Secondary | ICD-10-CM

## 2020-11-10 DIAGNOSIS — I2510 Atherosclerotic heart disease of native coronary artery without angina pectoris: Secondary | ICD-10-CM

## 2020-11-10 NOTE — Progress Notes (Signed)
Cardiology Office Note:    Date:  11/10/2020   ID:  [name], DOB 1941/06/14, MRN [id number]  PCP:  Velna Hatchet, MD  Cardiologist:  Jenne Campus, MD    Referring MD: Velna Hatchet, MD   Chief Complaint  Patient presents with  . Follow-up  Doing well  History of Present Illness:    Alyssa Cardenas is a 80 y.o. female with past medical history significant for coronary artery disease.  In 1992 she did have coronary bypass graft done.  Also left bundle branch block which is chronic, dyslipidemia, cardiomyopathy with ejection fraction a bit of 45%. She comes today to my office for follow-up.  Overall she is doing great.  She denies have any chest pain tightness squeezing pressure burning chest.  Still trying to be active and walk on the regular basis have no difficulty doing it.  Past Medical History:  Diagnosis Date  . Anxiety   . Breast cancer ([place])   . CAD (coronary artery disease), native coronary artery    Cath 08/27/1991 Normal Left main, calcified proximal LAD, 50% stenosis proximal LAD, 90% stenosis mid LAD, occluded Diag 1, 95% stenosis proximal Diag 2, 95% stenosis proximal CFX, 95% stenosis proximal OM 1, occluded RCA;  CABG w LIMA to LAD, SVG to dx, SVG to OM1-2-circ, SVG to PDA 08/29/1991 Dr. Redmond Pulling    . Cancer ([place])   . Cardiomyopathy ([place]) 08/01/2018   Ischemic, microinfarction before 1992  . Chronic kidney disease   . 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 History of colonic polyps 03/10/2015  . 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 Hyperlipidemia   . Hypertensive heart disease    . Left bundle branch block 08/01/2018  . Myocardial infarction ([place])   . Obesity (BMI 30-39.9)   . Preop cardiovascular exam 08/01/2018  . 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   1992  . Vitamin D deficiency 03/10/2015    Past Surgical History:  Procedure Laterality Date  . BREAST  BIOPSY    . BREAST EXCISIONAL BIOPSY Bilateral   . BREAST LUMPECTOMY Left    2019 DCIS  . BREAST LUMPECTOMY WITH RADIOACTIVE SEED LOCALIZATION Left 08/18/2018   Procedure: LEFT BREAST LUMPECTOMY WITH RADIOACTIVE SEED LOCALIZATION;  Surgeon: Jovita Kussmaul, MD;  Location: [place];  Service: General;  Laterality: Left;  . CORONARY ARTERY BYPASS GRAFT  1992  . EYE SURGERY     cataract removal 2017  . TONSILLECTOMY      Current Medications: Current Meds  Medication Sig  . amLODipine (NORVASC) 10 MG tablet Take 10 mg by mouth daily.   Marland Kitchen anastrozole (ARIMIDEX) 1 MG tablet TAKE 1 TABLET(1 MG) BY MOUTH DAILY  . aspirin EC 81 MG tablet Take 81 mg by mouth daily.  . Cholecalciferol (VITAMIN D3) 1.25 MG (50000 UT) CAPS Take 1 tablet by mouth once a week.  . dimenhyDRINATE (DRAMAMINE) 50 MG tablet Take 50 mg by mouth daily as needed for dizziness.   . ezetimibe (ZETIA) 10 MG tablet TAKE 1 TABLET(10 MG) BY MOUTH DAILY  . fluticasone (FLONASE) 50 MCG/ACT nasal spray Place 2 sprays into both nostrils daily as needed for allergies or rhinitis.  Javier Docker Oil 350 MG CAPS Take 350 mg by mouth daily.  Marland Kitchen loratadine (CLARITIN) 10 MG tablet Take 10 mg by mouth daily as needed for allergies.   Marland Kitchen losartan-hydrochlorothiazide (HYZAAR)  100-25 MG per tablet Take 0.5 tablets by mouth daily.   . metoprolol succinate (TOPROL-XL) 25 MG 24 hr tablet Take 25 mg by mouth daily.  . nitroGLYCERIN (NITROSTAT) 0.4 MG SL tablet Place 1 tablet (0.4 mg total) under the tongue every 5 (five) minutes as needed for chest pain.  . Polyvinyl Alcohol-Povidone PF 1.4-0.6 % SOLN Place 2 drops into both eyes daily as needed (for dry eyes).  . potassium chloride (KLOR-CON) 20 MEQ packet Take 20 mEq by mouth daily.   . rosuvastatin (CRESTOR) 40 MG tablet Take 40 mg by mouth every evening.      Allergies:   Patient has no known allergies.   Social History   Socioeconomic History  . Marital status: Married    Spouse name: Not on file   . Number of children: Not on file  . Years of education: Not on file  . Highest education level: Not on file  Occupational History  . Not on file  Tobacco Use  . Smoking status: Former Smoker    Quit date: 03/10/1975    Years since quitting: 45.7  . Smokeless tobacco: Never Used  Vaping Use  . Vaping Use: Never used  Substance and Sexual Activity  . Alcohol use: Yes    Alcohol/week: 0.0 standard drinks    Comment: Special occasions  . Drug use: No  . Sexual activity: Yes    Birth control/protection: Post-menopausal  Other Topics Concern  . Not on file  Social History Narrative  . Not on file   Social Determinants of Health   Financial Resource Strain: Not on file  Food Insecurity: Not on file  Transportation Needs: Not on file  Physical Activity: Not on file  Stress: Not on file  Social Connections: Not on file     Family History: The patient's family history includes Diabetes in her maternal grandmother; Heart disease in her brother, brother, father, mother, and paternal grandfather; Hyperlipidemia in her brother, brother, and mother; Hypertension in her brother, brother, father, and mother; Lung cancer in her father; Mental illness in her brother; Stroke in her maternal grandfather and paternal grandmother. ROS:   Please see the history of present illness.    All 14 point review of systems negative except as described per history of present illness  EKGs/Labs/Other Studies Reviewed:      Recent Labs: No results found for requested labs within last 8760 hours.  Recent Lipid Panel No results found for: CHOL, TRIG, HDL, CHOLHDL, VLDL, LDLCALC, LDLDIRECT  Physical Exam:    VS:  BP 120/78 (BP Location: Right Arm, Patient Position: Sitting)   Pulse 69   Ht 5' (1.524 m)   Wt 208 lb (94.3 kg)   SpO2 94%   BMI 40.62 kg/m     Wt Readings from Last 3 Encounters:  11/10/20 208 lb (94.3 kg)  05/07/20 209 lb (94.8 kg)  02/28/20 207 lb 8 oz (94.1 kg)     GEN:  Well  nourished, well developed in no acute distress HEENT: Normal NECK: No JVD; No carotid bruits LYMPHATICS: No lymphadenopathy CARDIAC: RRR, no murmurs, no rubs, no gallops RESPIRATORY:  Clear to auscultation without rales, wheezing or rhonchi  ABDOMEN: Soft, non-tender, non-distended MUSCULOSKELETAL:  No edema; No deformity  SKIN: Warm and dry LOWER EXTREMITIES: no swelling NEUROLOGIC:  Alert and oriented x 3 PSYCHIATRIC:  Normal affect   ASSESSMENT:    1. Coronary artery disease involving native coronary artery of native heart without angina pectoris  2. Left bundle branch block   3. Hypertensive heart disease    4. Ischemic cardiomyopathy    PLAN:    In order of problems listed above:  1. Coronary disease status post coronary bypass graft many years ago asymptomatic stress test done 2019 -.  We will continue present management. 2. Cardiomyopathy with ejection fraction 45%.  This is a chronic problem stable related most likely bundle branch block.  We will continue monitoring we will continue present management which include small dose of beta-blocker as well as ARB.  We initiated conversation about potentially switching to [facility] however since she is doing well we decided to continue present management. 3. Essential hypertension, her blood pressure is well controlled we will continue present management. 4. Dyslipidemia: I did review her K PN her LDL is 67 HDL 60 this is from May 22, 2020.  She is taking high intense statin as well as Zetia.   Medication Adjustments/Labs and Tests Ordered: Current medicines are reviewed at length with the patient today.  Concerns regarding medicines are outlined above.  Orders Placed This Encounter  Procedures  . EKG 12-Lead   Medication changes: No orders of the defined types were placed in this encounter.   Signed, Park Liter, MD, [facility] 11/10/2020 1:38 PM    [place]

## 2020-11-10 NOTE — Patient Instructions (Signed)

## 2021-03-04 NOTE — Progress Notes (Signed)
Patient Care Team: Velna Hatchet, MD as PCP - General (Internal Medicine) Jacolyn Reedy, MD as Consulting Physician (Cardiology) Jovita Kussmaul, MD as Consulting Physician (General Surgery) Nicholas Lose, MD as Consulting Physician (Hematology and Oncology) Gery Pray, MD as Consulting Physician (Radiation Oncology)  DIAGNOSIS:    ICD-10-CM   1. Ductal carcinoma in situ (DCIS) of left breast  D05.12       SUMMARY OF ONCOLOGIC HISTORY: Oncology History  Ductal carcinoma in situ (DCIS) of left breast  07/17/2018 Initial Diagnosis   Screening detected left breast mass 11 o'clock position 1.6 cm with irregular margins axilla negative biopsy revealed intermediate grade DCIS ER 100%, PR 90%, Tis NX stage 0    08/18/2018 Surgery   Left lumpectomy: Grade 2 DCIS, 2.3 cm, margins negative, ER 100%, PR 90%, no evidence of necrosis Tis NX stage 0    09/18/2018 -  Anti-estrogen oral therapy   Tamoxifen 20mg  daily, switched to anastrozole on 12/28/18 due to ringing in her ears, plan for 5 years     CHIEF COMPLIANT: Follow-up of left breast DCIS on anastrozole   INTERVAL HISTORY: Alyssa Cardenas is a 80 y.o. with above-mentioned history of DCIS of the left breast treated with lumpectomy and who is currently on anti-estrogen therapy with anastrozole, after tamoxifen led to severe ringing in her ears. Mammogram 07/03/2020 showed no evidence of new or recurrent breast carcinoma. She presents to clinic today for follow-up.  ALLERGIES:  has No Known Allergies.  MEDICATIONS:  Current Outpatient Medications  Medication Sig Dispense Refill   amLODipine (NORVASC) 10 MG tablet Take 10 mg by mouth daily.      anastrozole (ARIMIDEX) 1 MG tablet TAKE 1 TABLET(1 MG) BY MOUTH DAILY 90 tablet 3   aspirin EC 81 MG tablet Take 81 mg by mouth daily.     Cholecalciferol (VITAMIN D3) 1.25 MG (50000 UT) CAPS Take 1 tablet by mouth once a week.     dimenhyDRINATE (DRAMAMINE) 50 MG tablet Take 50 mg by  mouth daily as needed for dizziness.      ezetimibe (ZETIA) 10 MG tablet TAKE 1 TABLET(10 MG) BY MOUTH DAILY 90 tablet 1   fluticasone (FLONASE) 50 MCG/ACT nasal spray Place 2 sprays into both nostrils daily as needed for allergies or rhinitis.     Krill Oil 350 MG CAPS Take 350 mg by mouth daily.     loratadine (CLARITIN) 10 MG tablet Take 10 mg by mouth daily as needed for allergies.      losartan-hydrochlorothiazide (HYZAAR) 100-25 MG per tablet Take 0.5 tablets by mouth daily.      metoprolol succinate (TOPROL-XL) 25 MG 24 hr tablet Take 25 mg by mouth daily.     nitroGLYCERIN (NITROSTAT) 0.4 MG SL tablet Place 1 tablet (0.4 mg total) under the tongue every 5 (five) minutes as needed for chest pain. 25 tablet 5   Polyvinyl Alcohol-Povidone PF 1.4-0.6 % SOLN Place 2 drops into both eyes daily as needed (for dry eyes).     potassium chloride (KLOR-CON) 20 MEQ packet Take 20 mEq by mouth daily.      rosuvastatin (CRESTOR) 40 MG tablet Take 40 mg by mouth every evening.      No current facility-administered medications for this visit.    PHYSICAL EXAMINATION: ECOG PERFORMANCE STATUS: 1 - Symptomatic but completely ambulatory  Vitals:   03/05/21 0952  BP: 122/60  Pulse: 69  Resp: 18  Temp: 97.6 F (36.4 C)  SpO2: 97%  Filed Weights   03/05/21 0952  Weight: 204 lb 6 oz (92.7 kg)    BREAST: No palpable masses or nodules in either right or left breasts. No palpable axillary supraclavicular or infraclavicular adenopathy no breast tenderness or nipple discharge. (exam performed in the presence of a chaperone)  LABORATORY DATA:  I have reviewed the data as listed CMP Latest Ref Rng & Units 08/14/2018 07/26/2018  Glucose 70 - 99 mg/dL 82 125(H)  BUN 8 - 23 mg/dL 18 15  Creatinine 0.44 - 1.00 mg/dL 1.10(H) 1.21(H)  Sodium 135 - 145 mmol/L 139 141  Potassium 3.5 - 5.1 mmol/L 3.6 3.2(L)  Chloride 98 - 111 mmol/L 104 105  CO2 22 - 32 mmol/L 24 26  Calcium 8.9 - 10.3 mg/dL 9.3 9.8   Total Protein 6.5 - 8.1 g/dL - 7.4  Total Bilirubin 0.3 - 1.2 mg/dL - 0.5  Alkaline Phos 38 - 126 U/L - 52  AST 15 - 41 U/L - 23  ALT 0 - 44 U/L - 18    Lab Results  Component Value Date   WBC 5.2 08/14/2018   HGB 12.5 08/14/2018   HCT 42.2 08/14/2018   MCV 89.2 08/14/2018   PLT 235 08/14/2018   NEUTROABS 2.2 07/26/2018    ASSESSMENT & PLAN:  Ductal carcinoma in situ (DCIS) of left breast 08/18/2018: Left lumpectomy: Grade 2 DCIS, 2.3 cm, margins negative, ER 100%, PR 90%, no evidence of necrosis Tis NX stage 0 Patient was provided option for radiation versus antiestrogen therapy.  Patient preferred antiestrogens.    Current treatment: Tamoxifen 20 mg daily started 09/01/2018 held 11/22/2018 for tinnitus, switched to anastrozole 11/22/2018.   Anastrozole toxicities: Ringing in ears improved Occ Hot flashes: Slightly worse than before Joint pains: Could be related to her cholesterol medication as well.   I renewed her anastrozole for another year   Breast cancer surveillance:  Mammogram  07/03/2020: Benign, breast density category B Breast Exam: 03/05/2021 benign   Return to clinic in 1 year for follow-up    No orders of the defined types were placed in this encounter.  The patient has a good understanding of the overall plan. she agrees with it. she will call with any problems that may develop before the next visit here.  Total time spent: 20 mins including face to face time and time spent for planning, charting and coordination of care  Rulon Eisenmenger, MD, MPH 03/05/2021  I, Reinaldo Raddle, am acting as scribe for Dr. Nicholas Lose, MD.  I have reviewed the above documentation for accuracy and completeness, and I agree with the above.

## 2021-03-05 ENCOUNTER — Other Ambulatory Visit: Payer: Self-pay

## 2021-03-05 ENCOUNTER — Inpatient Hospital Stay: Payer: Medicare Other | Attending: Hematology and Oncology | Admitting: Hematology and Oncology

## 2021-03-05 DIAGNOSIS — Z7982 Long term (current) use of aspirin: Secondary | ICD-10-CM | POA: Diagnosis not present

## 2021-03-05 DIAGNOSIS — Z79899 Other long term (current) drug therapy: Secondary | ICD-10-CM | POA: Insufficient documentation

## 2021-03-05 DIAGNOSIS — D0512 Intraductal carcinoma in situ of left breast: Secondary | ICD-10-CM | POA: Diagnosis present

## 2021-03-05 DIAGNOSIS — Z79811 Long term (current) use of aromatase inhibitors: Secondary | ICD-10-CM | POA: Diagnosis not present

## 2021-03-05 MED ORDER — ANASTROZOLE 1 MG PO TABS: ORAL_TABLET | ORAL | 3 refills | Status: DC

## 2021-03-05 MED ORDER — VITAMIN D 50 MCG (2000 UT) PO CAPS: 1.0000 | ORAL_CAPSULE | Freq: Every day | ORAL | Status: AC

## 2021-03-05 NOTE — Assessment & Plan Note (Signed)
08/18/2018:Left lumpectomy: Grade 2 DCIS, 2.3 cm, margins negative, ER 100%, PR 90%, no evidence of necrosis Tis NX stage 0 Patient was provided option for radiation versus antiestrogen therapy. Patient preferred antiestrogens.  Current treatment: Tamoxifen 20 mg daily started 12/27/2019held 11/22/2018 for tinnitus,switched to anastrozole 11/22/2018.  Anastrozole toxicities: Ringing in ears improved Occ Hot flashes: Slightly worse than before Joint pains: Could be related to her cholesterol medication as well.  I renewed her anastrozole for another year  Breast cancer surveillance:  Mammogram  07/03/2020: Benign, breast density category B Breast Exam: 03/05/2021 benign  Return to clinic in 1 year for follow-up

## 2021-05-19 ENCOUNTER — Other Ambulatory Visit: Payer: Self-pay

## 2021-05-20 ENCOUNTER — Encounter: Payer: Self-pay | Admitting: Cardiology

## 2021-05-20 ENCOUNTER — Other Ambulatory Visit: Payer: Self-pay

## 2021-05-20 ENCOUNTER — Ambulatory Visit (INDEPENDENT_AMBULATORY_CARE_PROVIDER_SITE_OTHER): Payer: Medicare Other | Admitting: Cardiology

## 2021-05-20 VITALS — BP 132/74 | HR 71 | Ht 60.0 in | Wt 204.0 lb

## 2021-05-20 DIAGNOSIS — I255 Ischemic cardiomyopathy: Secondary | ICD-10-CM

## 2021-05-20 DIAGNOSIS — I2510 Atherosclerotic heart disease of native coronary artery without angina pectoris: Secondary | ICD-10-CM

## 2021-05-20 DIAGNOSIS — E782 Mixed hyperlipidemia: Secondary | ICD-10-CM | POA: Diagnosis not present

## 2021-05-20 DIAGNOSIS — I447 Left bundle-branch block, unspecified: Secondary | ICD-10-CM

## 2021-05-20 NOTE — Progress Notes (Signed)
Cardiology Office Note:    Date:  05/20/2021   ID:  Alyssa Cardenas, DOB 1941-02-24, MRN [id number]  PCP:  Velna Hatchet, MD  Cardiologist:  Jenne Campus, MD    Referring MD: Velna Hatchet, MD   Chief Complaint  Patient presents with   Follow-up  I am doing fine  History of Present Illness:    Alyssa Cardenas is a 80 y.o. female with past medical history significant for coronary artery disease.  In 1992 she got coronary bypass graft on also got chronic left bundle branch block, dyslipidemia, cardiomyopathy with ejection fraction in the neighborhood 45%.  She comes today to my office for follow-up overall she seems to be doing well she is liking weather getting colder and she is trying to be more active.  Denies have any chest pain, tightness, pressure, burning in the chest.  Past Medical History:  Diagnosis Date   Anxiety    Breast cancer ([place])    CAD (coronary artery disease), native coronary artery    Cath 08/27/1991 Normal Left main, calcified proximal LAD, 50% stenosis proximal LAD, 90% stenosis mid LAD, occluded Diag 1, 95% stenosis proximal Diag 2, 95% stenosis proximal CFX, 95% stenosis proximal OM 1, occluded RCA;  CABG w LIMA to LAD, SVG to dx, SVG to OM1-2-circ, SVG to PDA 08/29/1991 Dr. 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   1992   Vitamin D deficiency 03/10/2015    Past Surgical History:  Procedure Laterality Date   BREAST BIOPSY     BREAST EXCISIONAL  BIOPSY Bilateral    BREAST LUMPECTOMY Left    2019 DCIS   BREAST LUMPECTOMY WITH RADIOACTIVE SEED LOCALIZATION Left 08/18/2018   Procedure: LEFT BREAST LUMPECTOMY WITH RADIOACTIVE SEED LOCALIZATION;  Surgeon: Autumn Messing III, MD;  Location: [place];  Service: General;  Laterality: Left;   CORONARY ARTERY BYPASS GRAFT  1992   EYE SURGERY     cataract removal 2017   TONSILLECTOMY      Current Medications: Current Meds  Medication Sig   amLODipine (NORVASC) 10 MG tablet Take 10 mg by mouth daily.    anastrozole (ARIMIDEX) 1 MG tablet TAKE 1 TABLET(1 MG) BY MOUTH DAILY (Patient taking differently: Take 1 mg by mouth daily. TAKE 1 TABLET(1 MG) BY MOUTH DAILY)   aspirin EC 81 MG tablet Take 81 mg by mouth daily.   Cholecalciferol (VITAMIN D) 50 MCG (2000 UT) CAPS Take 1 capsule (2,000 Units total) by mouth daily.   dimenhyDRINATE (DRAMAMINE) 50 MG tablet Take 50 mg by mouth daily as needed for dizziness.    ezetimibe (ZETIA) 10 MG tablet TAKE 1 TABLET(10 MG) BY MOUTH DAILY (Patient taking differently: Take 10 mg by mouth daily.)   fluticasone (FLONASE) 50 MCG/ACT nasal spray Place 2 sprays into both nostrils daily as needed for allergies or rhinitis.   Krill Oil 350 MG CAPS Take 350 mg by  mouth daily.   loratadine (CLARITIN) 10 MG tablet Take 10 mg by mouth daily as needed for allergies.    losartan-hydrochlorothiazide (HYZAAR) 100-25 MG per tablet Take 0.5 tablets by mouth daily.    metoprolol succinate (TOPROL-XL) 25 MG 24 hr tablet Take 25 mg by mouth daily.   nitroGLYCERIN (NITROSTAT) 0.4 MG SL tablet Place 1 tablet (0.4 mg total) under the tongue every 5 (five) minutes as needed for chest pain.   Polyvinyl Alcohol-Povidone PF 1.4-0.6 % SOLN Place 2 drops into both eyes daily as needed (for dry eyes).   potassium chloride (KLOR-CON) 20 MEQ packet Take 20 mEq by mouth daily.    potassium chloride SA (KLOR-CON) 20 MEQ tablet Take 20 mEq by mouth daily.   rosuvastatin (CRESTOR) 40 MG tablet Take  40 mg by mouth every evening.      Allergies:   Patient has no known allergies.   Social History   Socioeconomic History   Marital status: Married    Spouse name: Not on file   Number of children: Not on file   Years of education: Not on file   Highest education level: Not on file  Occupational History   Not on file  Tobacco Use   Smoking status: Former    Types: Cigarettes    Quit date: 03/10/1975    Years since quitting: 46.2   Smokeless tobacco: Never  Vaping Use   Vaping Use: Never used  Substance and Sexual Activity   Alcohol use: Yes    Alcohol/week: 0.0 standard drinks    Comment: Special occasions   Drug use: No   Sexual activity: Yes    Birth control/protection: Post-menopausal  Other Topics Concern   Not on file  Social History Narrative   Not on file   Social Determinants of Health   Financial Resource Strain: Not on file  Food Insecurity: Not on file  Transportation Needs: Not on file  Physical Activity: Not on file  Stress: Not on file  Social Connections: Not on file     Family History: The patient's family history includes Diabetes in her maternal grandmother; Heart disease in her brother, brother, father, mother, and paternal grandfather; Hyperlipidemia in her brother, brother, and mother; Hypertension in her brother, brother, father, and mother; Lung cancer in her father; Mental illness in her brother; Stroke in her maternal grandfather and paternal grandmother. ROS:   Please see the history of present illness.    All 14 point review of systems negative except as described per history of present illness  EKGs/Labs/Other Studies Reviewed:      Recent Labs: No results found for requested labs within last 8760 hours.  Recent Lipid Panel No results found for: CHOL, TRIG, HDL, CHOLHDL, VLDL, LDLCALC, LDLDIRECT  Physical Exam:    VS:  BP 132/74 (BP Location: Right Arm, Patient Position: Sitting)   Pulse 71   Ht 5' (1.524 m)   Wt 204 lb (92.5  kg)   SpO2 96%   BMI 39.84 kg/m     Wt Readings from Last 3 Encounters:  05/20/21 204 lb (92.5 kg)  03/05/21 204 lb 6 oz (92.7 kg)  11/10/20 208 lb (94.3 kg)     GEN:  Well nourished, well developed in no acute distress HEENT: Normal NECK: No JVD; No carotid bruits LYMPHATICS: No lymphadenopathy CARDIAC: RRR, no murmurs, no rubs, no gallops RESPIRATORY:  Clear to auscultation without rales, wheezing or rhonchi  ABDOMEN: Soft, non-tender, non-distended MUSCULOSKELETAL:  No edema; No deformity  SKIN: Warm and dry LOWER EXTREMITIES: no swelling NEUROLOGIC:  Alert and oriented x 3 PSYCHIATRIC:  Normal affect   ASSESSMENT:    1. Coronary artery disease involving native coronary artery of native heart without angina pectoris   2. Left bundle branch block   3. Ischemic cardiomyopathy   4. Mixed hyperlipidemia    PLAN:    In order of problems listed above:  Coronary disease stable from that point of on appropriate medications which I will continue. Left bundle branch risk which is chronic Ischemic cardiomyopathy ejection fraction 45% she is on appropriate medication which include beta-blocker as well as ARB will continue.  I suspect the reason for her cardiomyopathy is bundle branch block.  Continue present management. Dyslipidemia I did review her K PN which show me her LDL of 67 HDL 60 this is from May 22, 2020.  She is scheduled to see her primary care physician and she will have blood test repeated for cholesterol.  We will wait for results of it   Medication Adjustments/Labs and Tests Ordered: Current medicines are reviewed at length with the patient today.  Concerns regarding medicines are outlined above.  No orders of the defined types were placed in this encounter.  Medication changes: No orders of the defined types were placed in this encounter.   Signed, Park Liter, MD, [facility] 05/20/2021 11:27 AM    [place]

## 2021-05-20 NOTE — Patient Instructions (Signed)

## 2021-05-28 ENCOUNTER — Other Ambulatory Visit: Payer: Self-pay | Admitting: Hematology and Oncology

## 2021-05-28 ENCOUNTER — Other Ambulatory Visit: Payer: Self-pay | Admitting: Internal Medicine

## 2021-05-28 DIAGNOSIS — Z1231 Encounter for screening mammogram for malignant neoplasm of breast: Secondary | ICD-10-CM

## 2021-06-18 ENCOUNTER — Other Ambulatory Visit: Payer: Self-pay | Admitting: Internal Medicine

## 2021-06-18 DIAGNOSIS — R5381 Other malaise: Secondary | ICD-10-CM

## 2021-07-06 ENCOUNTER — Ambulatory Visit
Admission: RE | Admit: 2021-07-06 | Discharge: 2021-07-06 | Disposition: A | Discharge status: Home / Self Care | Payer: Medicare Other | Source: Ambulatory Visit | Attending: Hematology and Oncology | Admitting: Hematology and Oncology

## 2021-07-06 ENCOUNTER — Other Ambulatory Visit: Payer: Self-pay

## 2021-07-06 DIAGNOSIS — Z1231 Encounter for screening mammogram for malignant neoplasm of breast: Secondary | ICD-10-CM

## 2021-07-10 ENCOUNTER — Other Ambulatory Visit: Payer: Self-pay | Admitting: Internal Medicine

## 2021-07-10 DIAGNOSIS — E559 Vitamin D deficiency, unspecified: Secondary | ICD-10-CM

## 2021-11-20 DIAGNOSIS — G4700 Insomnia, unspecified: Secondary | ICD-10-CM

## 2021-11-20 DIAGNOSIS — I5022 Chronic systolic (congestive) heart failure: Secondary | ICD-10-CM | POA: Insufficient documentation

## 2021-11-20 HISTORY — DX: Insomnia, unspecified: G47.00

## 2021-11-24 ENCOUNTER — Encounter: Payer: Self-pay | Admitting: Cardiology

## 2021-11-24 ENCOUNTER — Other Ambulatory Visit: Payer: Self-pay

## 2021-11-24 ENCOUNTER — Ambulatory Visit (INDEPENDENT_AMBULATORY_CARE_PROVIDER_SITE_OTHER): Payer: Medicare Other | Admitting: Cardiology

## 2021-11-24 VITALS — BP 116/74 | HR 66 | Ht 61.0 in | Wt 205.0 lb

## 2021-11-24 DIAGNOSIS — I10 Essential (primary) hypertension: Secondary | ICD-10-CM

## 2021-11-24 DIAGNOSIS — K5730 Diverticulosis of large intestine without perforation or abscess without bleeding: Secondary | ICD-10-CM | POA: Insufficient documentation

## 2021-11-24 DIAGNOSIS — K625 Hemorrhage of anus and rectum: Secondary | ICD-10-CM | POA: Insufficient documentation

## 2021-11-24 DIAGNOSIS — I447 Left bundle-branch block, unspecified: Secondary | ICD-10-CM

## 2021-11-24 DIAGNOSIS — E782 Mixed hyperlipidemia: Secondary | ICD-10-CM

## 2021-11-24 DIAGNOSIS — R142 Eructation: Secondary | ICD-10-CM | POA: Insufficient documentation

## 2021-11-24 DIAGNOSIS — I77819 Aortic ectasia, unspecified site: Secondary | ICD-10-CM | POA: Diagnosis not present

## 2021-11-24 DIAGNOSIS — I255 Ischemic cardiomyopathy: Secondary | ICD-10-CM

## 2021-11-24 MED ORDER — NITROGLYCERIN 0.4 MG SL SUBL: 0.4000 mg | SUBLINGUAL_TABLET | SUBLINGUAL | 5 refills | Status: DC | PRN

## 2021-11-24 NOTE — Patient Instructions (Addendum)
Medication Instructions:  ?Your physician recommends that you continue on your current medications as directed. Please refer to the Current Medication list given to you today. ? ?*If you need a refill on your cardiac medications before your next appointment, please call your pharmacy* ? ? ?Lab Work: ?None ?If you have labs (blood work) drawn today and your tests are completely normal, you will receive your results only by: ?MyChart Message (if you have MyChart) OR ?A paper copy in the mail ?If you have any lab test that is abnormal or we need to change your treatment, we will call you to review the results. ? ? ?Testing/Procedures: ?Your physician has requested that you have an echocardiogram. Echocardiography is a painless test that uses sound waves to create images of your heart. It provides your doctor with information about the size and shape of your heart and how well your heart?s chambers and valves are working. This procedure takes approximately one hour. There are no restrictions for this procedure. ? ? ? ?Follow-Up: ?At [facility], you and your health needs are our priority.  As part of our continuing mission to provide you with exceptional heart care, we have created designated Provider Care Teams.  These Care Teams include your primary Cardiologist (physician) and Advanced Practice Providers (APPs -  Physician Assistants and Nurse Practitioners) who all work together to provide you with the care you need, when you need it. ? ?We recommend signing up for the patient portal called "MyChart".  Sign up information is provided on this After Visit Summary.  MyChart is used to connect with patients for Virtual Visits (Telemedicine).  Patients are able to view lab/test results, encounter notes, upcoming appointments, etc.  Non-urgent messages can be sent to your provider as well.   ?To learn more about what you can do with MyChart, go to [email or web address].   ? ?Your next appointment:   ?6  month(s) ? ?The format for your next appointment:   ?In Person ? ?Provider:   ?Jenne Campus, MD  ? ? ?Other Instructions ?Nitroglycerin refilled  ?

## 2021-11-24 NOTE — Progress Notes (Signed)
?Cardiology Office Note:   ? ?Date:  11/24/2021  ? ?ID:  Alyssa Cardenas, DOB 1941/03/03, MRN [id number] ? ?PCP:  Velna Hatchet, MD  ?Cardiologist:  Jenne Campus, MD   ? ?Referring MD: Velna Hatchet, MD  ? ?Chief Complaint  ?Patient presents with  ? Follow-up  ?  Need refill on Nitroglycerin  ? ? ?History of Present Illness:   ? ?Alyssa Cardenas is a 81 y.o. female with past medical history significant for coronary artery disease, in 1999 she did have coronary bypass graft with LIMA to LAD, SVG to diagonal SVG obtuse marginal SVG to PDA.  Also cardiomyopathy with ejection fraction 45% which is ischemic in origin, left bundle branch block, dyslipidemia. ?She is in my office today for follow-up.  Overall doing well.  Denies have any chest pain tightness squeezing pressure burning chest.  She is trying to be active.  She is taking care of her husband who does have a problem with memory. ? ?Past Medical History:  ?Diagnosis Date  ? Anxiety   ? Breast cancer ([place])   ? CAD (coronary artery disease), native coronary artery   ? Cath 08/27/1991 Normal Left main, calcified proximal LAD, 50% stenosis proximal LAD, 90% stenosis mid LAD, occluded Diag 1, 95% stenosis proximal Diag 2, 95% stenosis proximal CFX, 95% stenosis proximal OM 1, occluded RCA;  CABG w LIMA to LAD, SVG to dx, SVG to OM1-2-circ, SVG to PDA 08/29/1991 Dr. Redmond Pulling    ? Cancer [facility])   ? Cardiomyopathy ([place]) 08/01/2018  ? Ischemic, microinfarction before 1992  ? Chronic kidney disease   ? 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 History of colonic polyps 03/10/2015  ? History of MI (myocardial infarction) 03/10/2015  ? 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 Hyperlipidemia   ? Hypertensive heart disease    ? Insomnia 11/20/2021  ? Left bundle branch block 08/01/2018  ? Myocardial infarction [facility])   ? Obesity (BMI 30-39.9)   ? Preop cardiovascular exam 08/01/2018  ? 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 1992  ? Vitamin D  deficiency 03/10/2015  ? ? ?Past Surgical History:  ?Procedure Laterality Date  ? BREAST BIOPSY    ? BREAST EXCISIONAL BIOPSY Bilateral   ? BREAST LUMPECTOMY Left   ? 2019 DCIS  ? BREAST LUMPECTOMY WITH RADIOACTIVE SEED LOCALIZATION Left 08/18/2018  ? Procedure: LEFT BREAST LUMPECTOMY WITH RADIOACTIVE SEED LOCALIZATION;  Surgeon: Jovita Kussmaul, MD;  Location: [place];  Service: General;  Laterality: Left;  ? CORONARY ARTERY BYPASS GRAFT  1992  ? EYE SURGERY    ? cataract removal 2017  ? TONSILLECTOMY    ? ? ?Current Medications: ?Current Meds  ?Medication Sig  ? amLODipine (NORVASC) 10 MG tablet Take 10 mg by mouth daily.   ? anastrozole (ARIMIDEX) 1 MG tablet TAKE 1 TABLET(1 MG) BY MOUTH DAILY (Patient taking differently: Take 1 mg by mouth daily. TAKE 1 TABLET(1 MG) BY MOUTH DAILY)  ? aspirin EC 81 MG tablet Take 81 mg by mouth daily.  ? Cholecalciferol (VITAMIN D) 50 MCG (2000 UT) CAPS Take 1 capsule (2,000 Units total) by mouth daily.  ? dimenhyDRINATE (DRAMAMINE) 50 MG tablet Take 50 mg by mouth daily as needed for dizziness.   ? ezetimibe (ZETIA) 10 MG tablet TAKE 1 TABLET(10 MG) BY MOUTH DAILY (Patient taking differently: Take 10 mg by mouth daily.)  ? fluticasone (FLONASE) 50 MCG/ACT nasal spray  Place 2 sprays into both nostrils daily as needed for allergies or rhinitis.  ? Krill Oil 350 MG CAPS Take 350 mg by mouth daily.  ? loratadine (CLARITIN) 10 MG tablet Take 10 mg by mouth daily as needed for allergies.   ? losartan-hydrochlorothiazide (HYZAAR) 100-25 MG per tablet Take 0.5 tablets by mouth daily.   ? metoprolol succinate (TOPROL-XL) 25 MG 24 hr tablet Take 25 mg by mouth daily.  ? nitroGLYCERIN (NITROSTAT) 0.4 MG SL tablet Place 1 tablet (0.4 mg total) under the tongue every 5 (five) minutes as needed for chest pain.  ? Polyvinyl Alcohol-Povidone PF 1.4-0.6 % SOLN Place 2 drops into both eyes daily as needed (for dry eyes).  ? potassium chloride SA (KLOR-CON) 20 MEQ tablet Take 20 mEq by mouth daily.  ?  rosuvastatin (CRESTOR) 40 MG tablet Take 40 mg by mouth every evening.   ?  ? ?Allergies:   Ace inhibitors, Latex, and Tamoxifen  ? ?Social History  ? ?Socioeconomic History  ? Marital status: Married  ?  Spouse name: Not on file  ? Number of children: Not on file  ? Years of education: Not on file  ? Highest education level: Not on file  ?Occupational History  ? Not on file  ?Tobacco Use  ? Smoking status: Former  ?  Types: Cigarettes  ?  Quit date: 03/10/1975  ?  Years since quitting: 46.7  ? Smokeless tobacco: Never  ?Vaping Use  ? Vaping Use: Never used  ?Substance and Sexual Activity  ? Alcohol use: Yes  ?  Alcohol/week: 0.0 standard drinks  ?  Comment: Special occasions  ? Drug use: No  ? Sexual activity: Yes  ?  Birth control/protection: Post-menopausal  ?Other Topics Concern  ? Not on file  ?Social History Narrative  ? Not on file  ? ?Social Determinants of Health  ? ?Financial Resource Strain: Not on file  ?Food Insecurity: Not on file  ?Transportation Needs: Not on file  ?Physical Activity: Not on file  ?Stress: Not on file  ?Social Connections: Not on file  ?  ? ?Family History: ?The patient's family history includes Diabetes in her maternal grandmother; Heart disease in her brother, brother, father, mother, and paternal grandfather; Hyperlipidemia in her brother, brother, and mother; Hypertension in her brother, brother, father, and mother; Lung cancer in her father; Mental illness in her brother; Stroke in her maternal grandfather and paternal grandmother. ?ROS:   ?Please see the history of present illness.    ?All 14 point review of systems negative except as described per history of present illness ? ?EKGs/Labs/Other Studies Reviewed:   ? ? ? ?Recent Labs: ?No results found for requested labs within last 8760 hours.  ?Recent Lipid Panel ?No results found for: CHOL, TRIG, HDL, CHOLHDL, VLDL, LDLCALC, LDLDIRECT ? ?Physical Exam:   ? ?VS:  BP 116/74 (BP Location: Left Arm, Patient Position: Sitting)    Pulse 66   Ht '5\' 1"'$  (1.549 m)   Wt 205 lb (93 kg)   SpO2 96%   BMI 38.73 kg/m?    ? ?Wt Readings from Last 3 Encounters:  ?11/24/21 205 lb (93 kg)  ?05/20/21 204 lb (92.5 kg)  ?03/05/21 204 lb 6 oz (92.7 kg)  ?  ? ?GEN:  Well nourished, well developed in no acute distress ?HEENT: Normal ?NECK: No JVD; No carotid bruits ?LYMPHATICS: No lymphadenopathy ?CARDIAC: RRR, no murmurs, no rubs, no gallops ?RESPIRATORY:  Clear to auscultation without rales, wheezing or rhonchi  ?  ABDOMEN: Soft, non-tender, non-distended ?MUSCULOSKELETAL:  No edema; No deformity  ?SKIN: Warm and dry ?LOWER EXTREMITIES: no swelling ?NEUROLOGIC:  Alert and oriented x 3 ?PSYCHIATRIC:  Normal affect  ? ?ASSESSMENT:   ? ?1. Ischemic cardiomyopathy   ?2. Left bundle branch block   ?3. Essential hypertension   ?4. Dilatation of aorta (HCC)   ?5. Mixed hyperlipidemia   ? ?PLAN:   ? ?In order of problems listed above: ? ?Ischemic cardiomyopathy.  I will ask her to have repeated echocardiogram to recheck left ventricle ejection fraction.  Prior echo ejection fraction was [place] 45%.  In the meantime we will continue with [facility], will continue with Toprol-XL. ?Left bundle branch block noted chronic ?Essential hypertension blood pressure well controlled today continue present management. ?Dyslipidemia I did review K PN which show me her LDL of 69 HDL 63 this is from June 05, 2021.  We will continue present management.  She is on high intensity statin form of Crestor 40 which I will continue. ?Dilatation of the aorta.  I will recheck echocardiogram to see the size of the aorta again. ? ? ?Medication Adjustments/Labs and Tests Ordered: ?Current medicines are reviewed at length with the patient today.  Concerns regarding medicines are outlined above.  ?No orders of the defined types were placed in this encounter. ? ?Medication changes: No orders of the defined types were placed in this encounter. ? ? ?Signed, ?Park Liter, MD, [facility] ?11/24/2021  11:48 AM    ?[place] ?

## 2021-12-09 ENCOUNTER — Telehealth: Payer: Self-pay | Admitting: Cardiology

## 2021-12-09 DIAGNOSIS — I255 Ischemic cardiomyopathy: Secondary | ICD-10-CM

## 2021-12-09 NOTE — Telephone Encounter (Signed)
Spoke with pt who stated that she did not get her Echo scheduled at her last visit on 3-21. She would like to go to [place]. Will order Echocardiogram as per MD note.  ?

## 2021-12-09 NOTE — Telephone Encounter (Signed)
Patient called stating Dr. Agustin Cree wanted her to have a Echo done.  I did not see an order for it in Epic.  ?

## 2021-12-10 ENCOUNTER — Ambulatory Visit
Admission: RE | Admit: 2021-12-10 | Discharge: 2021-12-10 | Disposition: A | Discharge status: Home / Self Care | Payer: Medicare Other | Source: Ambulatory Visit | Attending: Internal Medicine | Admitting: Internal Medicine

## 2021-12-10 DIAGNOSIS — E559 Vitamin D deficiency, unspecified: Secondary | ICD-10-CM

## 2021-12-11 ENCOUNTER — Ambulatory Visit (HOSPITAL_COMMUNITY): Payer: Medicare Other | Attending: Cardiology

## 2021-12-11 DIAGNOSIS — I255 Ischemic cardiomyopathy: Secondary | ICD-10-CM | POA: Insufficient documentation

## 2021-12-11 LAB — ECHOCARDIOGRAM COMPLETE
Area-P 1/2: 2.54 cm2
MV M vel: 4.74 m/s
MV Peak grad: 89.9 mmHg
S' Lateral: 3.65 cm

## 2021-12-11 MED ORDER — PERFLUTREN LIPID MICROSPHERE
1.0000 mL | INTRAVENOUS | Status: AC | PRN
  Administered 2021-12-11: 2 mL via INTRAVENOUS

## 2021-12-15 NOTE — Progress Notes (Signed)
Echocardiogram showed slightly worse left ventricle ejection fraction now 30 to 35%, please stop lisinopril/hydrochlorothiazide combo and start care with Entresto 2524 twice daily, Chem-7 need to be done within next 10 days

## 2021-12-16 ENCOUNTER — Telehealth: Payer: Self-pay | Admitting: Cardiology

## 2021-12-16 NOTE — Telephone Encounter (Signed)
Pt called in and stated she was returning call to [facility] about her Echo results  ? ?Best number- [phone] ?

## 2021-12-21 ENCOUNTER — Telehealth: Payer: Self-pay

## 2021-12-21 DIAGNOSIS — I2510 Atherosclerotic heart disease of native coronary artery without angina pectoris: Secondary | ICD-10-CM

## 2021-12-21 DIAGNOSIS — I10 Essential (primary) hypertension: Secondary | ICD-10-CM

## 2021-12-21 MED ORDER — ENTRESTO 24-26 MG PO TABS: 1.0000 | ORAL_TABLET | Freq: Two times a day (BID) | ORAL | 3 refills | Status: DC

## 2021-12-21 NOTE — Telephone Encounter (Signed)
Results reviewed with pt as per Dr. Wendy Poet note. Pt will discontinue Losartan/HCTZ and will send in Entresto 2524 BID to pharmacy then Chem 7 in 10 days. Pt verbalized understanding and had no additional questions. ?Routed to PCP ?

## 2021-12-21 NOTE — Telephone Encounter (Signed)
-----   Message from Park Liter, MD sent at 12/15/2021  2:19 PM EDT ----- ?Echocardiogram showed slightly worse left ventricle ejection fraction now 30 to 35%, please stop lisinopril/hydrochlorothiazide combo and start care with Entresto 2524 twice daily, Chem-7 need to be done within next 10 days ?

## 2021-12-23 ENCOUNTER — Telehealth: Payer: Self-pay | Admitting: Cardiology

## 2021-12-23 NOTE — Telephone Encounter (Signed)
Would you like the pt to stop the potassium as you stopped her Losartan/HCTZ and started Entresto. ?

## 2021-12-23 NOTE — Telephone Encounter (Signed)
Pt needs clarification on mediations. Pt states that she was told to stop Losartin due to her starting Entresto. Pt would like to know if she should stop her Potassium as well. Please advise ?

## 2021-12-24 NOTE — Telephone Encounter (Signed)
Left VM to call back 

## 2021-12-24 NOTE — Telephone Encounter (Signed)
Recommendations reviewed with pt as per Dr. Krasowski's note.  Pt verbalized understanding and had no additional questions.  

## 2022-01-05 LAB — BASIC METABOLIC PANEL
BUN/Creatinine Ratio: 14 (ref 12–28)
BUN: 16 mg/dL (ref 8–27)
CO2: 20 mmol/L (ref 20–29)
Calcium: 9.7 mg/dL (ref 8.7–10.3)
Chloride: 106 mmol/L (ref 96–106)
Creatinine, Ser: 1.18 mg/dL — ABNORMAL HIGH (ref 0.57–1.00)
Glucose: 110 mg/dL — ABNORMAL HIGH (ref 70–99)
Potassium: 4 mmol/L (ref 3.5–5.2)
Sodium: 143 mmol/L (ref 134–144)
eGFR: 47 mL/min/{1.73_m2} — ABNORMAL LOW (ref >59)

## 2022-02-19 NOTE — Progress Notes (Signed)
Patient Care Team: Velna Hatchet, MD as PCP - General (Internal Medicine) Jacolyn Reedy, MD as Consulting Physician (Cardiology) Jovita Kussmaul, MD as Consulting Physician (General Surgery) Nicholas Lose, MD as Consulting Physician (Hematology and Oncology) Gery Pray, MD as Consulting Physician (Radiation Oncology)  DIAGNOSIS:  Encounter Diagnosis  Name Primary?   Ductal carcinoma in situ (DCIS) of left breast     SUMMARY OF ONCOLOGIC HISTORY: Oncology History  Ductal carcinoma in situ (DCIS) of left breast  07/17/2018 Initial Diagnosis   Screening detected left breast mass 11 o'clock position 1.6 cm with irregular margins axilla negative biopsy revealed intermediate grade DCIS ER 100%, PR 90%, Tis NX stage 0   08/18/2018 Surgery   Left lumpectomy: Grade 2 DCIS, 2.3 cm, margins negative, ER 100%, PR 90%, no evidence of necrosis Tis NX stage 0   09/18/2018 -  Anti-estrogen oral therapy   Tamoxifen '20mg'$  daily, switched to anastrozole on 12/28/18 due to ringing in her ears, plan for 5 years     CHIEF COMPLIANT: Follow-up of left breast DCIS on anastrozole   INTERVAL HISTORY: Alyssa Cardenas is a  81 y.o. with above-mentioned history of DCIS of the left breast treated with lumpectomy and who is currently on anti-estrogen therapy with anastrozole, after tamoxifen led to severe ringing in her ears. States that the ringing went away. Denies pain and discomfort in breast. States that she has some sever hot flashes. States that she do get some type of time for exercising.   ALLERGIES:  is allergic to ace inhibitors, latex, and tamoxifen.  MEDICATIONS:  Current Outpatient Medications  Medication Sig Dispense Refill   amLODipine (NORVASC) 10 MG tablet Take 10 mg by mouth daily.      anastrozole (ARIMIDEX) 1 MG tablet TAKE 1 TABLET(1 MG) BY MOUTH DAILY 90 tablet 3   aspirin EC 81 MG tablet Take 81 mg by mouth daily.     Cholecalciferol (VITAMIN D) 50 MCG (2000 UT) CAPS Take 1  capsule (2,000 Units total) by mouth daily. 30 capsule    dimenhyDRINATE (DRAMAMINE) 50 MG tablet Take 50 mg by mouth daily as needed for dizziness.      ezetimibe (ZETIA) 10 MG tablet TAKE 1 TABLET(10 MG) BY MOUTH DAILY (Patient taking differently: Take 10 mg by mouth daily.) 90 tablet 1   fluticasone (FLONASE) 50 MCG/ACT nasal spray Place 2 sprays into both nostrils daily as needed for allergies or rhinitis.     Krill Oil 350 MG CAPS Take 350 mg by mouth daily.     loratadine (CLARITIN) 10 MG tablet Take 10 mg by mouth daily as needed for allergies.      metoprolol succinate (TOPROL-XL) 25 MG 24 hr tablet Take 25 mg by mouth daily.     nitroGLYCERIN (NITROSTAT) 0.4 MG SL tablet Place 1 tablet (0.4 mg total) under the tongue every 5 (five) minutes as needed for chest pain. 25 tablet 5   Polyvinyl Alcohol-Povidone PF 1.4-0.6 % SOLN Place 2 drops into both eyes daily as needed (for dry eyes).     potassium chloride SA (KLOR-CON) 20 MEQ tablet Take 20 mEq by mouth daily.     rosuvastatin (CRESTOR) 40 MG tablet Take 40 mg by mouth every evening.      sacubitril-valsartan (ENTRESTO) 24-26 MG Take 1 tablet by mouth 2 (two) times daily. 180 tablet 3   No current facility-administered medications for this visit.    PHYSICAL EXAMINATION: ECOG PERFORMANCE STATUS: 1 - Symptomatic but completely  ambulatory  Vitals:   03/05/22 1015  BP: 127/70  Pulse: 68  Resp: 18  Temp: 97.8 F (36.6 C)  SpO2: 97%   Filed Weights   03/05/22 1015  Weight: 202 lb 6.4 oz (91.8 kg)    BREAST: No palpable masses or nodules in either right or left breasts. No palpable axillary supraclavicular or infraclavicular adenopathy no breast tenderness or nipple discharge. (exam performed in the presence of a chaperone)  LABORATORY DATA:  I have reviewed the data as listed    Latest Ref Rng & Units 01/04/2022   10:33 AM 08/14/2018    8:40 AM 07/26/2018    8:16 AM  CMP  Glucose 70 - 99 mg/dL 110  82  125   BUN 8 - 27  mg/dL '16  18  15   '$ Creatinine 0.57 - 1.00 mg/dL 1.18  1.10  1.21   Sodium 134 - 144 mmol/L 143  139  141   Potassium 3.5 - 5.2 mmol/L 4.0  3.6  3.2   Chloride 96 - 106 mmol/L 106  104  105   CO2 20 - 29 mmol/L '20  24  26   '$ Calcium 8.7 - 10.3 mg/dL 9.7  9.3  9.8   Total Protein 6.5 - 8.1 g/dL   7.4   Total Bilirubin 0.3 - 1.2 mg/dL   0.5   Alkaline Phos 38 - 126 U/L   52   AST 15 - 41 U/L   23   ALT 0 - 44 U/L   18     Lab Results  Component Value Date   WBC 5.2 08/14/2018   HGB 12.5 08/14/2018   HCT 42.2 08/14/2018   MCV 89.2 08/14/2018   PLT 235 08/14/2018   NEUTROABS 2.2 07/26/2018    ASSESSMENT & PLAN:  Ductal carcinoma in situ (DCIS) of left breast 08/18/2018: Left lumpectomy: Grade 2 DCIS, 2.3 cm, margins negative, ER 100%, PR 90%, no evidence of necrosis Tis NX stage 0 Patient was provided option for radiation versus antiestrogen therapy.  Patient preferred antiestrogens.    Current treatment: Tamoxifen 20 mg daily started 09/01/2018 held 11/22/2018 for tinnitus, switched to anastrozole 11/22/2018.   Anastrozole toxicities: Ringing in ears improved Occ Hot flashes:    I renewed her anastrozole for another year   Breast cancer surveillance:  Mammogram 07/08/2021: Benign, breast density category B Breast Exam: 03/05/2022 benign Bone Density 12/10/21: T score -0.3: Normal   Return to clinic in 1 year for follow-up and after that she will be seen on an as-needed basis    No orders of the defined types were placed in this encounter.  The patient has a good understanding of the overall plan. she agrees with it. she will call with any problems that may develop before the next visit here. Total time spent: 30 mins including face to face time and time spent for planning, charting and co-ordination of care   Harriette Ohara, MD 03/05/22    I Gardiner Coins am scribing for Dr. Lindi Adie  I have reviewed the above documentation for accuracy and completeness, and I agree  with the above.

## 2022-03-04 ENCOUNTER — Other Ambulatory Visit: Payer: Self-pay | Admitting: Hematology and Oncology

## 2022-03-05 ENCOUNTER — Other Ambulatory Visit: Payer: Self-pay

## 2022-03-05 ENCOUNTER — Inpatient Hospital Stay: Payer: Medicare Other | Attending: Hematology and Oncology | Admitting: Hematology and Oncology

## 2022-03-05 DIAGNOSIS — D0512 Intraductal carcinoma in situ of left breast: Secondary | ICD-10-CM | POA: Diagnosis present

## 2022-03-05 DIAGNOSIS — Z79899 Other long term (current) drug therapy: Secondary | ICD-10-CM | POA: Diagnosis not present

## 2022-03-05 DIAGNOSIS — Z170 Estrogen receptor positive status [ER+]: Secondary | ICD-10-CM | POA: Diagnosis not present

## 2022-03-05 DIAGNOSIS — Z79811 Long term (current) use of aromatase inhibitors: Secondary | ICD-10-CM | POA: Insufficient documentation

## 2022-03-05 DIAGNOSIS — Z7982 Long term (current) use of aspirin: Secondary | ICD-10-CM | POA: Insufficient documentation

## 2022-03-05 MED ORDER — ANASTROZOLE 1 MG PO TABS: ORAL_TABLET | ORAL | 3 refills | Status: DC

## 2022-03-05 NOTE — Assessment & Plan Note (Addendum)
08/18/2018:Left lumpectomy: Grade 2 DCIS, 2.3 cm, margins negative, ER 100%, PR 90%, no evidence of necrosis Tis NX stage 0 Patient was provided option for radiation versus antiestrogen therapy. Patient preferred antiestrogens.  Current treatment: Tamoxifen 20 mg daily started 12/27/2019held 11/22/2018 for tinnitus,switched to anastrozole 11/22/2018.  Anastrozole toxicities: Ringing in ears improved Occ Hot flashes:   I renewed her anastrozole for another year  Breast cancer surveillance: Mammogram11/10/2020: Benign, breast density category B Breast Exam: 03/05/2022 benign Bone Density 12/10/21: T score -0.3: Normal  Return to clinic in 1 year for follow-up and after that she will be seen on an as-needed basis

## 2022-05-12 ENCOUNTER — Ambulatory Visit: Payer: Medicare Other | Attending: Cardiology | Admitting: Cardiology

## 2022-05-12 ENCOUNTER — Encounter: Payer: Self-pay | Admitting: Cardiology

## 2022-05-12 VITALS — BP 144/76 | HR 69 | Ht 60.0 in | Wt 204.0 lb

## 2022-05-12 DIAGNOSIS — I5022 Chronic systolic (congestive) heart failure: Secondary | ICD-10-CM | POA: Diagnosis not present

## 2022-05-12 DIAGNOSIS — N1832 Chronic kidney disease, stage 3b: Secondary | ICD-10-CM

## 2022-05-12 DIAGNOSIS — I10 Essential (primary) hypertension: Secondary | ICD-10-CM

## 2022-05-12 DIAGNOSIS — I447 Left bundle-branch block, unspecified: Secondary | ICD-10-CM

## 2022-05-12 DIAGNOSIS — I77819 Aortic ectasia, unspecified site: Secondary | ICD-10-CM

## 2022-05-12 DIAGNOSIS — I255 Ischemic cardiomyopathy: Secondary | ICD-10-CM

## 2022-05-12 MED ORDER — METOPROLOL SUCCINATE ER 50 MG PO TB24: 50.0000 mg | ORAL_TABLET | Freq: Every day | ORAL | 3 refills | Status: DC

## 2022-05-12 NOTE — Progress Notes (Signed)
Cardiology Office Note:    Date:  05/12/2022   ID:  Alyssa Cardenas, DOB October 18, 1940, MRN [id number]  PCP:  Velna Hatchet, MD  Cardiologist:  Jenne Campus, MD    Referring MD: Velna Hatchet, MD   Chief Complaint  Patient presents with   Medication Management    Feet swelling sine on Entresto    History of Present Illness:    Alyssa Cardenas is a 81 y.o. female with past medical history significant for coronary artery disease.  In 1999 she had coronary artery bypass graft with LIMA to LAD, SVG to the diagonal, SVG to obtuse marginal SVG to PDA at that time her ejection fraction was about 45%.  Additional problems include left bundle branch block, dyslipidemia.  Last time I saw her we did echocardiogram to recheck left ventricle ejection fraction, sadly her echocardiogram showed decrease in left ventricle ejection fraction to about 35%.  Gradual I am trying to put her on appropriate guideline directed medical therapy however there are some difficulty tolerating it.  She is described to have some swelling of lower extremities at evening time especially when she takes [place].  She is already on amlodipine which probably contribute to the swelling.  She denies have any chest pain tightness squeezing pressure burning chest.  Past Medical History:  Diagnosis Date   Anxiety    Breast cancer ([place])    CAD (coronary artery disease), native coronary artery    Cath 08/27/1991 Normal Left main, calcified proximal LAD, 50% stenosis proximal LAD, 90% stenosis mid LAD, occluded Diag 1, 95% stenosis proximal Diag 2, 95% stenosis proximal CFX, 95% stenosis proximal OM 1, occluded RCA;  CABG w LIMA to LAD, SVG to dx, SVG to OM1-2-circ, SVG to PDA 08/29/1991 Dr. 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  EF     Hyperlipidemia    Hypertensive heart disease     Insomnia 11/20/2021   Left bundle branch block 08/01/2018   Myocardial infarction ([place])    Obesity (BMI 30-39.9)    Preop cardiovascular exam 08/01/2018   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   1992   Vitamin D deficiency 03/10/2015    Past Surgical History:  Procedure Laterality Date   BREAST BIOPSY     BREAST EXCISIONAL BIOPSY Bilateral    BREAST LUMPECTOMY Left    2019 DCIS   BREAST LUMPECTOMY WITH RADIOACTIVE SEED LOCALIZATION Left 08/18/2018   Procedure: LEFT BREAST LUMPECTOMY WITH RADIOACTIVE SEED LOCALIZATION;  Surgeon: Autumn Messing III, MD;  Location: [place] [facility];  Service: General;  Laterality: Left;   CORONARY ARTERY BYPASS GRAFT  1992   EYE SURGERY     cataract removal 2017   TONSILLECTOMY      Current Medications: Current Meds  Medication Sig   amLODipine (NORVASC) 10 MG tablet Take 10 mg by mouth daily.    anastrozole (ARIMIDEX) 1 MG tablet TAKE 1 TABLET(1 MG) BY MOUTH DAILY (Patient taking differently: Take 1 mg by mouth daily. TAKE 1 TABLET(1 MG) BY MOUTH DAILY)   aspirin EC 81 MG tablet Take 81 mg by mouth daily.   Cholecalciferol (VITAMIN D) 50 MCG (2000 UT) CAPS Take 1 capsule (2,000 Units  total) by mouth daily.   dimenhyDRINATE (DRAMAMINE) 50 MG tablet Take 50 mg by mouth daily as needed for dizziness.    ezetimibe (ZETIA) 10 MG tablet TAKE 1 TABLET(10 MG) BY MOUTH DAILY (Patient taking differently: Take 10 mg by mouth daily.)   fluticasone (FLONASE) 50 MCG/ACT nasal spray Place 2 sprays into both nostrils daily as needed for allergies or rhinitis.   Krill Oil 350 MG CAPS Take 350 mg by mouth daily.   loratadine (CLARITIN) 10 MG tablet Take 10 mg by mouth daily as needed for allergies.    metoprolol succinate (TOPROL-XL) 25 MG 24 hr tablet Take 25 mg by mouth daily.   nitroGLYCERIN (NITROSTAT) 0.4 MG SL tablet Place 1 tablet (0.4 mg  total) under the tongue every 5 (five) minutes as needed for chest pain.   Polyvinyl Alcohol-Povidone PF 1.4-0.6 % SOLN Place 2 drops into both eyes daily as needed (for dry eyes).   potassium chloride SA (KLOR-CON) 20 MEQ tablet Take 20 mEq by mouth daily.   rosuvastatin (CRESTOR) 40 MG tablet Take 40 mg by mouth every evening.    sacubitril-valsartan (ENTRESTO) 24-26 MG Take 1 tablet by mouth 2 (two) times daily.     Allergies:   Ace inhibitors, Latex, and Tamoxifen   Social History   Socioeconomic History   Marital status: Married    Spouse name: Not on file   Number of children: Not on file   Years of education: Not on file   Highest education level: Not on file  Occupational History   Not on file  Tobacco Use   Smoking status: Former    Types: Cigarettes    Quit date: 03/10/1975    Years since quitting: 47.2   Smokeless tobacco: Never  Vaping Use   Vaping Use: Never used  Substance and Sexual Activity   Alcohol use: Yes    Alcohol/week: 0.0 standard drinks of alcohol    Comment: Special occasions   Drug use: No   Sexual activity: Yes    Birth control/protection: Post-menopausal  Other Topics Concern   Not on file  Social History Narrative   Not on file   Social Determinants of Health   Financial Resource Strain: Not on file  Food Insecurity: Not on file  Transportation Needs: Not on file  Physical Activity: Not on file  Stress: Not on file  Social Connections: Not on file     Family History: The patient's family history includes Diabetes in her maternal grandmother; Heart disease in her brother, brother, father, mother, and paternal grandfather; Hyperlipidemia in her brother, brother, and mother; Hypertension in her brother, brother, father, and mother; Lung cancer in her father; Mental illness in her brother; Stroke in her maternal grandfather and paternal grandmother. ROS:   Please see the history of present illness.    All 14 point review of systems negative  except as described per history of present illness  EKGs/Labs/Other Studies Reviewed:      Recent Labs: 01/04/2022: BUN 16; Creatinine, Ser 1.18; Potassium 4.0; Sodium 143  Recent Lipid Panel No results found for: "CHOL", "TRIG", "HDL", "CHOLHDL", "VLDL", "LDLCALC", "LDLDIRECT"  Physical Exam:    VS:  BP (!) 144/76 (BP Location: Left Arm, Patient Position: Sitting)   Pulse 69   Ht 5' (1.524 m)   Wt 204 lb (92.5 kg)   SpO2 93%   BMI 39.84 kg/m     Wt Readings from Last 3 Encounters:  05/12/22 204 lb (92.5 kg)  03/05/22 202  lb 6.4 oz (91.8 kg)  11/24/21 205 lb (93 kg)     GEN:  Well nourished, well developed in no acute distress HEENT: Normal NECK: No JVD; No carotid bruits LYMPHATICS: No lymphadenopathy CARDIAC: RRR, no murmurs, no rubs, no gallops RESPIRATORY:  Clear to auscultation without rales, wheezing or rhonchi  ABDOMEN: Soft, non-tender, non-distended MUSCULOSKELETAL:  No edema; No deformity  SKIN: Warm and dry LOWER EXTREMITIES: no swelling NEUROLOGIC:  Alert and oriented x 3 PSYCHIATRIC:  Normal affect   ASSESSMENT:    1. Ischemic cardiomyopathy   2. Left bundle branch block   3. Chronic systolic heart failure ([place])   4. Dilatation of aorta (HCC)   5. Essential hypertension   6. Chronic kidney disease, stage 3b (HCC)    PLAN:    In order of problems listed above:  Ischemic cardiomyopathy.  I will do EKG today if EKG is fine we will increase dose of beta-blocker to metoprolol succinate 50 mg daily.  I will also ask her to have Chem-7 done if Chem-7 is reasonable we will stop her potassium and give her Aldactone hopefully in the future will be willing to increase dose of Entresto but I am worried about kidney dysfunction.  In the future we will also consider SGLT2 agent Left bundle branch block chronic noted. Systolic congestive heart failure seems to be compensated today Essential hypertension uncontrolled.  Hopefully the addition of high dose of  beta-blocker will help with that Dilatation of the aorta stable.   Medication Adjustments/Labs and Tests Ordered: Current medicines are reviewed at length with the patient today.  Concerns regarding medicines are outlined above.  No orders of the defined types were placed in this encounter.  Medication changes: No orders of the defined types were placed in this encounter.   Signed, Park Liter, MD, [facility] 05/12/2022 1:41 PM    [place]

## 2022-05-12 NOTE — Patient Instructions (Addendum)
Medication Instructions:  Your physician has recommended you make the following change in your medication:   INCREASE: Metoprolol to '50mg'$  daily by mouth   Lab Work: BMP- today [address] [place] [address] If you have labs (blood work) drawn today and your tests are completely normal, you will receive your results only by: [place] (if you have MyChart) OR A paper copy in the mail If you have any lab test that is abnormal or we need to change your treatment, we will call you to review the results.   Testing/Procedures: None Ordered   Follow-Up: At [facility], you and your health needs are our priority.  As part of our continuing mission to provide you with exceptional heart care, we have created designated Provider Care Teams.  These Care Teams include your primary Cardiologist (physician) and Advanced Practice Providers (APPs -  Physician Assistants and Nurse Practitioners) who all work together to provide you with the care you need, when you need it.  We recommend signing up for the patient portal called "MyChart".  Sign up information is provided on this After Visit Summary.  MyChart is used to connect with patients for Virtual Visits (Telemedicine).  Patients are able to view lab/test results, encounter notes, upcoming appointments, etc.  Non-urgent messages can be sent to your provider as well.   To learn more about what you can do with MyChart, go to [email or web address].    Your next appointment:   3 month(s)  The format for your next appointment:   In Person  Provider:   Jenne Campus, MD    Other Instructions NA

## 2022-05-12 NOTE — Addendum Note (Signed)
Addended by: Jacobo Forest D on: 05/12/2022 02:01 PM   Modules accepted: Orders

## 2022-05-12 NOTE — Addendum Note (Signed)
Addended by: Darrel Reach on: 05/12/2022 02:20 PM   Modules accepted: Orders

## 2022-05-13 LAB — BASIC METABOLIC PANEL
BUN/Creatinine Ratio: 13 (ref 12–28)
BUN: 15 mg/dL (ref 8–27)
CO2: 21 mmol/L (ref 20–29)
Calcium: 9.7 mg/dL (ref 8.7–10.3)
Chloride: 103 mmol/L (ref 96–106)
Creatinine, Ser: 1.19 mg/dL — ABNORMAL HIGH (ref 0.57–1.00)
Glucose: 97 mg/dL (ref 70–99)
Potassium: 4.1 mmol/L (ref 3.5–5.2)
Sodium: 140 mmol/L (ref 134–144)
eGFR: 47 mL/min/{1.73_m2} — ABNORMAL LOW (ref >59)

## 2022-05-26 ENCOUNTER — Telehealth: Payer: Self-pay

## 2022-05-26 NOTE — Telephone Encounter (Signed)
-----   Message from Park Liter, MD sent at 05/19/2022 11:14 AM EDT ----- Kidney function mildly diminished but stable.  I will not change any medications, continue present management

## 2022-05-26 NOTE — Telephone Encounter (Signed)
Unable to reach the patient letter mailed requesting a call back

## 2022-05-26 NOTE — Telephone Encounter (Signed)
Pt is returning call.  

## 2022-05-27 NOTE — Telephone Encounter (Signed)
Patient informed of results.  

## 2022-05-27 NOTE — Telephone Encounter (Signed)
Patient informed of consent

## 2022-06-01 ENCOUNTER — Other Ambulatory Visit: Payer: Self-pay | Admitting: Hematology and Oncology

## 2022-06-01 DIAGNOSIS — Z1231 Encounter for screening mammogram for malignant neoplasm of breast: Secondary | ICD-10-CM

## 2022-06-25 ENCOUNTER — Telehealth: Payer: Self-pay | Admitting: Cardiology

## 2022-06-25 NOTE — Telephone Encounter (Signed)
Spoke with pt regarding lab results from 05-12-22. Pt agreed and verbalized understanding.

## 2022-06-25 NOTE — Telephone Encounter (Signed)
Patient called to follow-up on test results. 

## 2022-07-07 ENCOUNTER — Ambulatory Visit
Admission: RE | Admit: 2022-07-07 | Discharge: 2022-07-07 | Disposition: A | Discharge status: Home / Self Care | Payer: Medicare Other | Source: Ambulatory Visit | Attending: Hematology and Oncology | Admitting: Hematology and Oncology

## 2022-07-07 DIAGNOSIS — Z1231 Encounter for screening mammogram for malignant neoplasm of breast: Secondary | ICD-10-CM

## 2022-08-23 ENCOUNTER — Other Ambulatory Visit: Payer: Self-pay

## 2022-09-07 ENCOUNTER — Ambulatory Visit: Payer: Medicare Other | Attending: Cardiology | Admitting: Cardiology

## 2022-09-07 ENCOUNTER — Encounter: Payer: Self-pay | Admitting: Cardiology

## 2022-09-07 VITALS — BP 122/64 | HR 66 | Ht 60.0 in | Wt 208.0 lb

## 2022-09-07 DIAGNOSIS — I10 Essential (primary) hypertension: Secondary | ICD-10-CM | POA: Diagnosis not present

## 2022-09-07 DIAGNOSIS — I447 Left bundle-branch block, unspecified: Secondary | ICD-10-CM

## 2022-09-07 DIAGNOSIS — I2510 Atherosclerotic heart disease of native coronary artery without angina pectoris: Secondary | ICD-10-CM | POA: Diagnosis not present

## 2022-09-07 DIAGNOSIS — R0609 Other forms of dyspnea: Secondary | ICD-10-CM

## 2022-09-07 DIAGNOSIS — I255 Ischemic cardiomyopathy: Secondary | ICD-10-CM

## 2022-09-07 DIAGNOSIS — Z951 Presence of aortocoronary bypass graft: Secondary | ICD-10-CM

## 2022-09-07 NOTE — Progress Notes (Signed)
Cardiology Office Note:    Date:  09/07/2022   ID:  [name], DOB 04-Apr-1941, MRN [id number]  PCP:  Velna Hatchet, MD  Cardiologist:  Jenne Campus, MD    Referring MD: Velna Hatchet, MD   Chief Complaint  Patient presents with   Follow-up  Doing well  History of Present Illness:    Alyssa Cardenas is a 82 y.o. female  with past medical history significant for coronary artery disease. In 1999 she had coronary artery bypass graft with LIMA to LAD, SVG to the diagonal, SVG to obtuse marginal SVG to PDA at that time her ejection fraction was about 45%. Additional problems include left bundle branch block, dyslipidemia. Last time I saw her we did echocardiogram to recheck left ventricle ejection fraction, sadly her echocardiogram showed decrease in left ventricle ejection fraction to about 35%. Gradual I am trying to put her on appropriate guideline directed medical therapy however there are some difficulty tolerating it.  She is coming today to months for follow-up.  Overall seems to be doing well.  She denies having chest pain tightness squeezing pressure but just shortness of breath is gone and also swelling of lower extremities gone overall clinically she looks better  Past Medical History:  Diagnosis Date   Anxiety    Breast cancer ([place])    CAD (coronary artery disease), native coronary artery    Cath 08/27/1991 Normal Left main, calcified proximal LAD, 50% stenosis proximal LAD, 90% stenosis mid LAD, occluded Diag 1, 95% stenosis proximal Diag 2, 95% stenosis proximal CFX, 95% stenosis proximal OM 1, occluded RCA;  CABG w LIMA to LAD, SVG to dx, SVG to OM1-2-circ, SVG to PDA 08/29/1991 Dr. 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   1992   Vitamin D deficiency 03/10/2015    Past Surgical History:  Procedure Laterality Date   BREAST BIOPSY     BREAST EXCISIONAL BIOPSY Bilateral    BREAST LUMPECTOMY Left    2019 DCIS   BREAST LUMPECTOMY WITH RADIOACTIVE SEED LOCALIZATION Left 08/18/2018   Procedure: LEFT BREAST LUMPECTOMY WITH RADIOACTIVE SEED LOCALIZATION;  Surgeon: Autumn Messing III, MD;  Location: [place];  Service: General;  Laterality: Left;   CORONARY ARTERY BYPASS GRAFT  1992   EYE SURGERY     cataract removal 2017   TONSILLECTOMY      Current Medications: Current Meds  Medication Sig   amLODipine (NORVASC) 10 MG tablet Take 10 mg by mouth daily.    anastrozole (ARIMIDEX) 1 MG tablet TAKE 1 TABLET(1 MG) BY MOUTH DAILY (Patient taking differently: Take 1 mg by mouth daily. TAKE 1 TABLET(1 MG) BY MOUTH DAILY)   aspirin EC 81 MG tablet Take 81 mg by mouth daily.   Cholecalciferol (VITAMIN D) 50 MCG (2000 UT) CAPS Take 1 capsule (2,000 Units total) by mouth daily.   dimenhyDRINATE (DRAMAMINE) 50 MG  tablet Take 50 mg by mouth daily as needed for dizziness.    ezetimibe (ZETIA) 10 MG tablet TAKE 1 TABLET(10 MG) BY MOUTH DAILY (Patient taking differently: Take 10 mg by mouth daily.)   fluticasone (FLONASE) 50 MCG/ACT nasal spray Place 2 sprays into both nostrils daily as needed for allergies or rhinitis.   Krill Oil 350 MG CAPS Take 350 mg by mouth daily.   loratadine (CLARITIN) 10 MG tablet Take 10 mg by mouth daily as needed for allergies.    metoprolol succinate (TOPROL-XL) 50 MG 24 hr tablet Take 1 tablet (50 mg total) by mouth daily. Take with or immediately following a meal.   nitroGLYCERIN (NITROSTAT) 0.4 MG SL tablet Place 1  tablet (0.4 mg total) under the tongue every 5 (five) minutes as needed for chest pain.   Polyvinyl Alcohol-Povidone PF 1.4-0.6 % SOLN Place 2 drops into both eyes daily as needed (for dry eyes).   potassium chloride SA (KLOR-CON) 20 MEQ tablet Take 20 mEq by mouth daily.   rosuvastatin (CRESTOR) 40 MG tablet Take 40 mg by mouth every evening.    sacubitril-valsartan (ENTRESTO) 24-26 MG Take 1 tablet by mouth 2 (two) times daily.   [DISCONTINUED] losartan-hydrochlorothiazide (HYZAAR) 100-25 MG tablet Take 0.5 tablets by mouth daily.     Allergies:   Ace inhibitors, Latex, and Tamoxifen   Social History   Socioeconomic History   Marital status: Married    Spouse name: Not on file   Number of children: Not on file   Years of education: Not on file   Highest education level: Not on file  Occupational History   Not on file  Tobacco Use   Smoking status: Former    Types: Cigarettes    Quit date: 03/10/1975    Years since quitting: 47.5   Smokeless tobacco: Never  Vaping Use   Vaping Use: Never used  Substance and Sexual Activity   Alcohol use: Yes    Alcohol/week: 0.0 standard drinks of alcohol    Comment: Special occasions   Drug use: No   Sexual activity: Yes    Birth control/protection: Post-menopausal  Other Topics Concern   Not on file  Social History Narrative   Not on file   Social Determinants of Health   Financial Resource Strain: Not on file  Food Insecurity: Not on file  Transportation Needs: Not on file  Physical Activity: Not on file  Stress: Not on file  Social Connections: Not on file     Family History: The patient's family history includes Diabetes in her maternal grandmother; Heart disease in her brother, brother, father, mother, and paternal grandfather; Hyperlipidemia in her brother, brother, and mother; Hypertension in her brother, brother, father, and mother; Lung cancer in her father; Mental illness in her brother; Stroke in her maternal grandfather  and paternal grandmother. ROS:   Please see the history of present illness.    All 14 point review of systems negative except as described per history of present illness  EKGs/Labs/Other Studies Reviewed:      Recent Labs: 05/12/2022: BUN 15; Creatinine, Ser 1.19; Potassium 4.1; Sodium 140  Recent Lipid Panel No results found for: "CHOL", "TRIG", "HDL", "CHOLHDL", "VLDL", "LDLCALC", "LDLDIRECT"  Physical Exam:    VS:  BP 122/64 (BP Location: Left Arm, Patient Position: Sitting)   Pulse 66   Ht 5' (1.524 m)   Wt 208 lb 0.6 oz (94.4 kg)   SpO2 98%   BMI 40.63 kg/m  Wt Readings from Last 3 Encounters:  09/07/22 208 lb 0.6 oz (94.4 kg)  05/12/22 204 lb (92.5 kg)  03/05/22 202 lb 6.4 oz (91.8 kg)     GEN:  Well nourished, well developed in no acute distress HEENT: Normal NECK: No JVD; No carotid bruits LYMPHATICS: No lymphadenopathy CARDIAC: RRR, no murmurs, no rubs, no gallops RESPIRATORY:  Clear to auscultation without rales, wheezing or rhonchi  ABDOMEN: Soft, non-tender, non-distended MUSCULOSKELETAL:  No edema; No deformity  SKIN: Warm and dry LOWER EXTREMITIES: no swelling NEUROLOGIC:  Alert and oriented x 3 PSYCHIATRIC:  Normal affect   ASSESSMENT:    1. Ischemic cardiomyopathy   2. Coronary artery disease involving native coronary artery of native heart without angina pectoris   3. Left bundle branch block   4. Essential hypertension   5. Status post coronary artery bypass graft    PLAN:    In order of problems listed above:  Ischemic cardiomyopathy on guideline directed medical therapy did she have difficulty tolerating higher dosages.  I will check Chem-7 today, Chem-7 is fine we will increase dose of Entresto.  After that we will SGLT2 blocker. Coronary disease asymptomatic denies have any chest pain tightness squeezing pressure burning chest and appropriate medications. Left bundle branch block which is chronic. Essential hypertension: Blood pressure  well-controlled continue present management. Dyslipidemia I did review K PN which show me data from June 22, 2022 with LDL 68 HDL 62.  She is on Zetia as well as Crestor which I will continue   Medication Adjustments/Labs and Tests Ordered: Current medicines are reviewed at length with the patient today.  Concerns regarding medicines are outlined above.  No orders of the defined types were placed in this encounter.  Medication changes: No orders of the defined types were placed in this encounter.   Signed, Park Liter, MD, [facility] 09/07/2022 1:18 PM    [place]

## 2022-09-07 NOTE — Patient Instructions (Addendum)
Medication Instructions:  Your physician recommends that you continue on your current medications as directed. Please refer to the Current Medication list given to you today.  *If you need a refill on your cardiac medications before your next appointment, please call your pharmacy*   Lab Work: BMP - today [address] [place] [address] If you have labs (blood work) drawn today and your tests are completely normal, you will receive your results only by: [place] (if you have MyChart) OR A paper copy in the mail If you have any lab test that is abnormal or we need to change your treatment, we will call you to review the results.   Testing/Procedures: Your physician has requested that you have an echocardiogram. Echocardiography is a painless test that uses sound waves to create images of your heart. It provides your doctor with information about the size and shape of your heart and how well your heart's chambers and valves are working. This procedure takes approximately one hour. There are no restrictions for this procedure. Please do NOT wear cologne, perfume, aftershave, or lotions (deodorant is allowed). Please arrive 15 minutes prior to your appointment time.    Follow-Up: At [facility], you and your health needs are our priority.  As part of our continuing mission to provide you with exceptional heart care, we have created designated Provider Care Teams.  These Care Teams include your primary Cardiologist (physician) and Advanced Practice Providers (APPs -  Physician Assistants and Nurse Practitioners) who all work together to provide you with the care you need, when you need it.  We recommend signing up for the patient portal called "MyChart".  Sign up information is provided on this After Visit Summary.  MyChart is used to connect with patients for Virtual Visits (Telemedicine).  Patients are able to view lab/test results, encounter notes, upcoming appointments, etc.  Non-urgent  messages can be sent to your provider as well.   To learn more about what you can do with MyChart, go to [email or web address].    Your next appointment:   3 month(s)  The format for your next appointment:   In Person  Provider:   Jenne Campus, MD    Other Instructions NA

## 2022-09-07 NOTE — Addendum Note (Signed)
Addended by: Jacobo Forest D on: 09/07/2022 01:30 PM   Modules accepted: Orders

## 2022-09-08 LAB — BASIC METABOLIC PANEL
BUN/Creatinine Ratio: 16 (ref 12–28)
BUN: 18 mg/dL (ref 8–27)
CO2: 23 mmol/L (ref 20–29)
Calcium: 9.7 mg/dL (ref 8.7–10.3)
Chloride: 102 mmol/L (ref 96–106)
Creatinine, Ser: 1.16 mg/dL — ABNORMAL HIGH (ref 0.57–1.00)
Glucose: 96 mg/dL (ref 70–99)
Potassium: 4.4 mmol/L (ref 3.5–5.2)
Sodium: 140 mmol/L (ref 134–144)
eGFR: 48 mL/min/{1.73_m2} — ABNORMAL LOW (ref >59)

## 2022-09-10 ENCOUNTER — Telehealth: Payer: Self-pay

## 2022-09-10 ENCOUNTER — Telehealth: Payer: Self-pay | Admitting: Cardiology

## 2022-09-10 MED ORDER — ENTRESTO 49-51 MG PO TABS: 1.0000 | ORAL_TABLET | Freq: Two times a day (BID) | ORAL | 3 refills | Status: DC

## 2022-09-10 NOTE — Telephone Encounter (Signed)
Pt returning call for lab results  

## 2022-09-10 NOTE — Telephone Encounter (Signed)
Results reviewed with pt as per Dr. Wendy Poet note. Pt agreed to double dose of Entresto to 49-51 BID. Pt verbalized understanding and had no additional questions. Routed to PCP

## 2022-09-15 ENCOUNTER — Telehealth: Payer: Self-pay

## 2022-09-15 DIAGNOSIS — I10 Essential (primary) hypertension: Secondary | ICD-10-CM

## 2022-09-15 NOTE — Telephone Encounter (Signed)
BMP ordered for follow up after Entresto increased.

## 2022-09-16 ENCOUNTER — Telehealth: Payer: Self-pay

## 2022-09-16 LAB — BASIC METABOLIC PANEL
BUN/Creatinine Ratio: 16 (ref 12–28)
BUN: 19 mg/dL (ref 8–27)
CO2: 21 mmol/L (ref 20–29)
Calcium: 9.8 mg/dL (ref 8.7–10.3)
Chloride: 105 mmol/L (ref 96–106)
Creatinine, Ser: 1.21 mg/dL — ABNORMAL HIGH (ref 0.57–1.00)
Glucose: 106 mg/dL — ABNORMAL HIGH (ref 70–99)
Potassium: 4.3 mmol/L (ref 3.5–5.2)
Sodium: 140 mmol/L (ref 134–144)
eGFR: 46 mL/min/{1.73_m2} — ABNORMAL LOW (ref >59)

## 2022-09-16 NOTE — Telephone Encounter (Signed)
-----   Message from Park Liter, MD sent at 09/16/2022 12:27 PM EST ----- Chem-7 looks good, continue present management

## 2022-09-16 NOTE — Telephone Encounter (Signed)
Patient notified of results.

## 2022-09-18 ENCOUNTER — Other Ambulatory Visit: Payer: Self-pay | Admitting: Cardiology

## 2022-10-18 ENCOUNTER — Telehealth: Payer: Self-pay | Admitting: Cardiology

## 2022-10-18 NOTE — Telephone Encounter (Signed)
Pt Son came in due to mother wanting to change to a closer location to home and also due to her husband coming to [place]. Would like to switch to [facility] from [facility]. 02.12.2024

## 2022-12-07 ENCOUNTER — Ambulatory Visit (HOSPITAL_COMMUNITY): Payer: Medicare Other | Attending: Cardiology

## 2022-12-07 DIAGNOSIS — R0609 Other forms of dyspnea: Secondary | ICD-10-CM | POA: Diagnosis present

## 2022-12-07 LAB — ECHOCARDIOGRAM COMPLETE
Area-P 1/2: 2.36 cm2
S' Lateral: 3.2 cm

## 2022-12-14 ENCOUNTER — Telehealth: Payer: Self-pay

## 2022-12-14 NOTE — Telephone Encounter (Signed)
Unable to reach or LM, results mailed to the patient.

## 2022-12-14 NOTE — Telephone Encounter (Signed)
-----   Message from Georgeanna Lea, MD sent at 12/10/2022 11:02 AM EDT ----- Echocardiogram showed improvement left ventricle ejection fraction now it is 40 to 45%.  Will continue present management

## 2022-12-15 ENCOUNTER — Ambulatory Visit: Payer: Medicare Other | Admitting: Cardiology

## 2022-12-22 ENCOUNTER — Ambulatory Visit: Payer: Medicare Other | Attending: Cardiovascular Disease | Admitting: Cardiovascular Disease

## 2022-12-22 ENCOUNTER — Encounter: Payer: Self-pay | Admitting: Cardiovascular Disease

## 2022-12-22 VITALS — BP 112/68 | HR 64 | Ht 60.0 in | Wt 207.6 lb

## 2022-12-22 DIAGNOSIS — I252 Old myocardial infarction: Secondary | ICD-10-CM | POA: Diagnosis not present

## 2022-12-22 DIAGNOSIS — I119 Hypertensive heart disease without heart failure: Secondary | ICD-10-CM

## 2022-12-22 DIAGNOSIS — I447 Left bundle-branch block, unspecified: Secondary | ICD-10-CM

## 2022-12-22 DIAGNOSIS — E782 Mixed hyperlipidemia: Secondary | ICD-10-CM

## 2022-12-22 DIAGNOSIS — I2510 Atherosclerotic heart disease of native coronary artery without angina pectoris: Secondary | ICD-10-CM

## 2022-12-22 DIAGNOSIS — I255 Ischemic cardiomyopathy: Secondary | ICD-10-CM

## 2022-12-22 NOTE — Assessment & Plan Note (Signed)
Chronic. 

## 2022-12-22 NOTE — Assessment & Plan Note (Signed)
Old anteroseptal MI prior to 1982 at the time of CABG.  This was corroborated by her Myoview stress test performed in 2019 that showed anteroapical scar.

## 2022-12-22 NOTE — Assessment & Plan Note (Signed)
History of ischemic cardiomyopathy with an EF in the 40 to 45% range.  Due to an interval decline in her EF by echo Dr. Bing Matter began her on guideline directed optimal medical therapy which resulted in improvement in her EF back up to 40 to 45% by 2D echo 12/07/2022.  She denies symptoms of heart failure.

## 2022-12-22 NOTE — Assessment & Plan Note (Signed)
History of CAD status post CABG 08/25/2018 by Dr. Particia Lather.  She had LIMA to LAD, vein to diagonal branch, obtuse marginal branches 1 and 2 in the circumflex and to the PDA.  She denies chest pain.

## 2022-12-22 NOTE — Patient Instructions (Signed)
Medication Instructions:  Your physician recommends that you continue on your current medications as directed. Please refer to the Current Medication list given to you today.  *If you need a refill on your cardiac medications before your next appointment, please call your pharmacy*   Follow-Up: [facility], you and your health needs are our priority.  As part of our continuing mission to provide you with exceptional heart care, we have created designated Provider Care Teams.  These Care Teams include your primary Cardiologist (physician) and Advanced Practice Providers (APPs -  Physician Assistants and Nurse Practitioners) who all work together to provide you with the care you need, when you need it.  We recommend signing up for the patient portal called "MyChart".  Sign up information is provided on this After Visit Summary.  MyChart is used to connect with patients for Virtual Visits (Telemedicine).  Patients are able to view lab/test results, encounter notes, upcoming appointments, etc.  Non-urgent messages can be sent to your provider as well.   To learn more about what you can do with MyChart, go to [email or web address].    Your next appointment:   6 month(s)  Provider:   Angela Duke, PA-C, Callie Goodrich, PA-C, Jennifer, Lambert, PA-C, Kathryn Lawrence, DNP, ANP, Hao Meng, PA-C, Emily Monge, NP, or Deborah Wittenborn, NP      Then, Jonathan Berry, MD will plan to see you again in 12 month(s).   

## 2022-12-22 NOTE — Assessment & Plan Note (Signed)
History of hyperlipidemia on Zetia and high-dose Crestor lipid profile performed 06/22/2022 revealing total cholesterol 143, LDL 68 and HDL 62.

## 2022-12-22 NOTE — Progress Notes (Signed)
12/22/2022 Alyssa Cardenas   [birth year]  [id number]  Primary Physician [name], MD Primary Cardiologist: Runell Gess MD Nicholes Calamity, [place]  HPI:  Alyssa Cardenas is a 82 y.o. mildly overweight married African-American female mother of 1 living child, 1 son died at age 27 with a myocardial infarction, grandmother to 3 grandchildren who is transitioning her care from Dr. Bing Matter to myself because of location and proximity.  She is a retired Armed forces operational officer.  Her risk factors include treated hypertension and hyperlipidemia.  She is not diabetic.  Her family history is markable for mother and brother both who had bypass surgery.  She apparently had a heart attack back in 1992 and subsequent CABG x 4 by Dr. Particia Lather with a LIMA to her LAD, vein to a diagonal branch, sequential vein to OM1 into and to the PDA.  She does have ischemic cardiomyopathy with an EF in the 40-40 GDMT.  She Also Had Breast Cancer Surgically Addressed.  She Is Fairly Active and Exercises without Limitation.   Current Meds  Medication Sig   amLODipine (NORVASC) 10 MG tablet Take 10 mg by mouth daily.    anastrozole (ARIMIDEX) 1 MG tablet TAKE 1 TABLET(1 MG) BY MOUTH DAILY (Patient taking differently: Take 1 mg by mouth daily. TAKE 1 TABLET(1 MG) BY MOUTH DAILY)   aspirin EC 81 MG tablet Take 81 mg by mouth daily.   Cholecalciferol (VITAMIN D) 50 MCG (2000 UT) CAPS Take 1 capsule (2,000 Units total) by mouth daily.   dimenhyDRINATE (DRAMAMINE) 50 MG tablet Take 50 mg by mouth daily as needed for dizziness.    ezetimibe (ZETIA) 10 MG tablet TAKE 1 TABLET(10 MG) BY MOUTH DAILY (Patient taking differently: Take 10 mg by mouth daily.)   fluticasone (FLONASE) 50 MCG/ACT nasal spray Place 2 sprays into both nostrils daily as needed for allergies or rhinitis.   Krill Oil 350 MG CAPS Take 350 mg by mouth daily.   loratadine (CLARITIN) 10 MG tablet Take 10 mg by mouth daily as needed for allergies.     metoprolol succinate (TOPROL-XL) 50 MG 24 hr tablet Take 1 tablet (50 mg total) by mouth daily.   nitroGLYCERIN (NITROSTAT) 0.4 MG SL tablet Place 1 tablet (0.4 mg total) under the tongue every 5 (five) minutes as needed for chest pain.   Polyvinyl Alcohol-Povidone PF 1.4-0.6 % SOLN Place 2 drops into both eyes daily as needed (for dry eyes).   potassium chloride SA (KLOR-CON) 20 MEQ tablet Take 20 mEq by mouth daily.   rosuvastatin (CRESTOR) 40 MG tablet Take 40 mg by mouth every evening.    sacubitril-valsartan (ENTRESTO) 49-51 MG Take 1 tablet by mouth 2 (two) times daily.     Allergies  Allergen Reactions   Ace Inhibitors Cough and Other (See Comments)   Latex Hives and Other (See Comments)   Tamoxifen Tinitus and Other (See Comments)    Social History   Socioeconomic History   Marital status: Married    Spouse name: Not on file   Number of children: Not on file   Years of education: Not on file   Highest education level: Not on file  Occupational History   Not on file  Tobacco Use   Smoking status: Former    Types: Cigarettes    Quit date: 03/10/1975    Years since quitting: 47.8   Smokeless tobacco: Never  Vaping Use   Vaping Use: Never used  Substance and Sexual  Activity   Alcohol use: Yes    Alcohol/week: 0.0 standard drinks of alcohol    Comment: Special occasions   Drug use: No   Sexual activity: Yes    Birth control/protection: Post-menopausal  Other Topics Concern   Not on file  Social History Narrative   Not on file   Social Determinants of Health   Financial Resource Strain: Not on file  Food Insecurity: Not on file  Transportation Needs: Not on file  Physical Activity: Not on file  Stress: Not on file  Social Connections: Not on file  Intimate Partner Violence: Not on file     Review of Systems: General: negative for chills, fever, night sweats or weight changes.  Cardiovascular: negative for chest pain, dyspnea on exertion, edema, orthopnea,  palpitations, paroxysmal nocturnal dyspnea or shortness of breath Dermatological: negative for rash Respiratory: negative for cough or wheezing Urologic: negative for hematuria Abdominal: negative for nausea, vomiting, diarrhea, bright red blood per rectum, melena, or hematemesis Neurologic: negative for visual changes, syncope, or dizziness All other systems reviewed and are otherwise negative except as noted above.    Blood pressure 112/68, pulse 64, height 5' (1.524 m), weight 207 lb 9.6 oz (94.2 kg), SpO2 95 %.  General appearance: alert and no distress Neck: no adenopathy, no carotid bruit, no JVD, supple, symmetrical, trachea midline, and thyroid not enlarged, symmetric, no tenderness/mass/nodules Lungs: clear to auscultation bilaterally Heart: regular rate and rhythm, S1, S2 normal, no murmur, click, rub or gallop Extremities: extremities normal, atraumatic, no cyanosis or edema Pulses: 2+ and symmetric Skin: Skin color, texture, turgor normal. No rashes or lesions Neurologic: Grossly normal  EKG sinus rhythm at 64 with left bundle branch block.  I personally reviewed this EKG.  ASSESSMENT AND PLAN:   Hypertensive heart disease  History of essential hypertension blood pressure measured today at 112/68.  She is on amlodipine, metoprolol and Entresto.  Hyperlipidemia History of hyperlipidemia on Zetia and high-dose Crestor lipid profile performed 06/22/2022 revealing total cholesterol 143, LDL 68 and HDL 62.  History of MI (myocardial infarction) Old anteroseptal MI prior to 1982 at the time of CABG.  This was corroborated by her Myoview stress test performed in 2019 that showed anteroapical scar.  CAD (coronary artery disease), native coronary artery History of CAD status post CABG 08/25/2018 by Dr. Particia Lather.  She had LIMA to LAD, vein to diagonal branch, obtuse marginal branches 1 and 2 in the circumflex and to the PDA.  She denies chest pain.  Cardiomyopathy  (HCC) History of ischemic cardiomyopathy with an EF in the 40 to 45% range.  Due to an interval decline in her EF by echo Dr. Bing Matter began her on guideline directed optimal medical therapy which resulted in improvement in her EF back up to 40 to 45% by 2D echo 12/07/2022.  She denies symptoms of heart failure.  Left bundle branch block Chronic     Runell Gess MD [facility], [facility] 12/22/2022 9:59 AM

## 2022-12-22 NOTE — Assessment & Plan Note (Signed)
History of essential hypertension blood pressure measured today at 112/68.  She is on amlodipine, metoprolol and Entresto.

## 2023-02-11 ENCOUNTER — Other Ambulatory Visit: Payer: Self-pay | Admitting: Hematology and Oncology

## 2023-02-16 ENCOUNTER — Other Ambulatory Visit: Payer: Self-pay | Admitting: Cardiovascular Disease

## 2023-03-05 NOTE — Progress Notes (Signed)
Patient Care Team: [name], MD as PCP - General (Internal Medicine) Othella Boyer, MD as Consulting Physician (Cardiology) Griselda Miner, MD as Consulting Physician (General Surgery) Serena Croissant, MD as Consulting Physician (Hematology and Oncology) Antony Blackbird, MD as Consulting Physician (Radiation Oncology) Runell Gess, MD as Consulting Physician (Cardiology)  DIAGNOSIS: No diagnosis found.  SUMMARY OF ONCOLOGIC HISTORY: Oncology History  Ductal carcinoma in situ (DCIS) of left breast  07/17/2018 Initial Diagnosis   Screening detected left breast mass 11 o'clock position 1.6 cm with irregular margins axilla negative biopsy revealed intermediate grade DCIS ER 100%, PR 90%, Tis NX stage 0   08/18/2018 Surgery   Left lumpectomy: Grade 2 DCIS, 2.3 cm, margins negative, ER 100%, PR 90%, no evidence of necrosis Tis NX stage 0   09/18/2018 -  Anti-estrogen oral therapy   Tamoxifen 20mg  daily, switched to anastrozole on 12/28/18 due to ringing in her ears, plan for 5 years     CHIEF COMPLIANT: Follow-up anastrozole  INTERVAL HISTORY: Alyssa Cardenas is a  INTERVAL HISTORY: Alyssa Cardenas is a 82 y.o. with above-mentioned history of DCIS of the left breast treated with lumpectomy and who is currently on anti-estrogen therapy with anastrozole    ALLERGIES:  is allergic to ace inhibitors, latex, and tamoxifen.  MEDICATIONS:  Current Outpatient Medications  Medication Sig Dispense Refill   amLODipine (NORVASC) 10 MG tablet Take 10 mg by mouth daily.      anastrozole (ARIMIDEX) 1 MG tablet TAKE 1 TABLET(1 MG) BY MOUTH DAILY 90 tablet 3   aspirin EC 81 MG tablet Take 81 mg by mouth daily.     Cholecalciferol (VITAMIN D) 50 MCG (2000 UT) CAPS Take 1 capsule (2,000 Units total) by mouth daily. 30 capsule    dimenhyDRINATE (DRAMAMINE) 50 MG tablet Take 50 mg by mouth daily as needed for dizziness.      ezetimibe (ZETIA) 10 MG tablet TAKE 1 TABLET(10 MG) BY MOUTH  DAILY (Patient taking differently: Take 10 mg by mouth daily.) 90 tablet 1   fluticasone (FLONASE) 50 MCG/ACT nasal spray Place 2 sprays into both nostrils daily as needed for allergies or rhinitis.     Krill Oil 350 MG CAPS Take 350 mg by mouth daily.     loratadine (CLARITIN) 10 MG tablet Take 10 mg by mouth daily as needed for allergies.      metoprolol succinate (TOPROL-XL) 50 MG 24 hr tablet Take 1 tablet (50 mg total) by mouth daily. 90 tablet 3   nitroGLYCERIN (NITROSTAT) 0.4 MG SL tablet Place 1 tablet (0.4 mg total) under the tongue every 5 (five) minutes as needed for chest pain. 25 tablet 5   Polyvinyl Alcohol-Povidone PF 1.4-0.6 % SOLN Place 2 drops into both eyes daily as needed (for dry eyes).     potassium chloride SA (KLOR-CON) 20 MEQ tablet Take 20 mEq by mouth daily.     rosuvastatin (CRESTOR) 40 MG tablet Take 40 mg by mouth every evening.      sacubitril-valsartan (ENTRESTO) 49-51 MG TAKE 1 TABLET BY MOUTH TWICE DAILY 90 tablet 3   No current facility-administered medications for this visit.    PHYSICAL EXAMINATION: ECOG PERFORMANCE STATUS: {CHL ONC ECOG PS:[id number]}  There were no vitals filed for this visit. There were no vitals filed for this visit.  BREAST:*** No palpable masses or nodules in either right or left breasts. No palpable axillary supraclavicular or infraclavicular adenopathy no breast tenderness or nipple discharge. (exam performed  in the presence of a chaperone)  LABORATORY DATA:  I have reviewed the data as listed    Latest Ref Rng & Units 09/15/2022    1:37 PM 09/07/2022    1:57 PM 05/12/2022    2:35 PM  CMP  Glucose 70 - 99 mg/dL 829  96  97   BUN 8 - 27 mg/dL 19  18  15    Creatinine 0.57 - 1.00 mg/dL 5.62  1.30  8.65   Sodium 134 - 144 mmol/L 140  140  140   Potassium 3.5 - 5.2 mmol/L 4.3  4.4  4.1   Chloride 96 - 106 mmol/L 105  102  103   CO2 20 - 29 mmol/L 21  23  21    Calcium 8.7 - 10.3 mg/dL 9.8  9.7  9.7     Lab Results  Component  Value Date   WBC 5.2 08/14/2018   HGB 12.5 08/14/2018   HCT 42.2 08/14/2018   MCV 89.2 08/14/2018   PLT 235 08/14/2018   NEUTROABS 2.2 07/26/2018    ASSESSMENT & PLAN:  No problem-specific Assessment & Plan notes found for this encounter.    No orders of the defined types were placed in this encounter.  The patient has a good understanding of the overall plan. she agrees with it. she will call with any problems that may develop before the next visit here. Total time spent: 30 mins including face to face time and time spent for planning, charting and co-ordination of care   Sherlyn Lick, CMA 03/05/23    I Janan Ridge am acting as a Neurosurgeon for [employer]  ***

## 2023-03-08 ENCOUNTER — Other Ambulatory Visit: Payer: Self-pay

## 2023-03-08 ENCOUNTER — Inpatient Hospital Stay: Payer: Medicare Other | Attending: Hematology and Oncology | Admitting: Hematology and Oncology

## 2023-03-08 VITALS — BP 120/63 | HR 65 | Temp 97.2°F | Resp 18 | Ht 60.0 in | Wt 204.9 lb

## 2023-03-08 DIAGNOSIS — H9313 Tinnitus, bilateral: Secondary | ICD-10-CM | POA: Diagnosis not present

## 2023-03-08 DIAGNOSIS — Z79811 Long term (current) use of aromatase inhibitors: Secondary | ICD-10-CM | POA: Diagnosis not present

## 2023-03-08 DIAGNOSIS — D0512 Intraductal carcinoma in situ of left breast: Secondary | ICD-10-CM | POA: Diagnosis present

## 2023-03-08 NOTE — Assessment & Plan Note (Addendum)
08/18/2018: Left lumpectomy: Grade 2 DCIS, 2.3 cm, margins negative, ER 100%, PR 90%, no evidence of necrosis Tis NX stage 0 Patient was provided option for radiation versus antiestrogen therapy.  Patient preferred antiestrogens.    Current treatment: Tamoxifen 20 mg daily started 09/01/2018 held 11/22/2018 for tinnitus, switched to anastrozole 11/22/2018.   Anastrozole toxicities: Ringing in ears improved Occ Hot flashes:    She completed 5 years of antiestrogen therapy and therefore anastrozole can be discontinued   Breast cancer surveillance:  Mammogram 07/08/2021: Benign, breast density category B Breast Exam: 03/08/2023 benign Bone Density 12/10/21: T score -0.3: Normal   Return to clinic in 1 year for long-term survivorship clinic with Mardella Layman

## 2023-06-08 ENCOUNTER — Other Ambulatory Visit: Payer: Self-pay | Admitting: Hematology and Oncology

## 2023-06-08 DIAGNOSIS — Z1231 Encounter for screening mammogram for malignant neoplasm of breast: Secondary | ICD-10-CM

## 2023-06-20 ENCOUNTER — Encounter: Payer: Self-pay | Admitting: Nurse Practitioner

## 2023-06-20 ENCOUNTER — Ambulatory Visit: Payer: Medicare Other | Attending: Nurse Practitioner | Admitting: Nurse Practitioner

## 2023-06-20 VITALS — BP 100/60 | HR 67 | Ht 60.0 in | Wt 207.0 lb

## 2023-06-20 DIAGNOSIS — I255 Ischemic cardiomyopathy: Secondary | ICD-10-CM | POA: Diagnosis not present

## 2023-06-20 DIAGNOSIS — E785 Hyperlipidemia, unspecified: Secondary | ICD-10-CM

## 2023-06-20 DIAGNOSIS — I2510 Atherosclerotic heart disease of native coronary artery without angina pectoris: Secondary | ICD-10-CM | POA: Diagnosis not present

## 2023-06-20 DIAGNOSIS — I10 Essential (primary) hypertension: Secondary | ICD-10-CM | POA: Diagnosis not present

## 2023-06-20 DIAGNOSIS — I447 Left bundle-branch block, unspecified: Secondary | ICD-10-CM

## 2023-06-20 DIAGNOSIS — N1832 Chronic kidney disease, stage 3b: Secondary | ICD-10-CM

## 2023-06-20 NOTE — Progress Notes (Signed)
Office Visit    Patient Name: Alyssa Cardenas Date of Encounter: 06/20/2023  Primary Care Provider:  [name], MD Primary Cardiologist:  Nanetta Batty, MD  Chief Complaint    82 year old female with a history of CAD s/p CABG x 4 (LIMA-LAD, SVG-diagonal, SVG-OM1, SVG-PDA) in 1992, ICM, hypertension, hyperlipidemia, LBBB, CKD, and breast cancer who presents for follow-up related to CAD.  Past Medical History    Past Medical History:  Diagnosis Date   Anxiety    Breast cancer (HCC)    CAD (coronary artery disease), native coronary artery    Cath 08/27/1991 Normal Left main, calcified proximal LAD, 50% stenosis proximal LAD, 90% stenosis mid LAD, occluded Diag 1, 95% stenosis proximal Diag 2, 95% stenosis proximal CFX, 95% stenosis proximal OM 1, occluded RCA;  CABG w LIMA to LAD, SVG to dx, SVG to OM1-2-circ, SVG to PDA 08/29/1991 Dr. 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   Old ASMI in 1992 prior to CABG  EF     Hyperlipidemia    Hypertensive heart disease     Insomnia 11/20/2021   Left bundle branch block 08/01/2018   Myocardial infarction (HCC)    Obesity (BMI 30-39.9)    Preop cardiovascular exam 08/01/2018   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   1992   Vitamin D deficiency 03/10/2015   Past Surgical History:  Procedure Laterality Date   BREAST BIOPSY     BREAST EXCISIONAL BIOPSY Bilateral    BREAST LUMPECTOMY Left    2019 DCIS   BREAST LUMPECTOMY WITH RADIOACTIVE SEED LOCALIZATION Left 08/18/2018   Procedure: LEFT BREAST LUMPECTOMY WITH RADIOACTIVE SEED LOCALIZATION;  Surgeon: Griselda Miner, MD;  Location: [facility] OR;  Service: General;  Laterality: Left;   CORONARY ARTERY BYPASS GRAFT  1992   EYE SURGERY     cataract  removal 2017   TONSILLECTOMY      Allergies  Allergies  Allergen Reactions   Ace Inhibitors Cough and Other (See Comments)   Latex Hives and Other (See Comments)   Tamoxifen Tinitus and Other (See Comments)     Labs/Other Studies Reviewed    The following studies were reviewed today:  Cardiac Studies & Procedures     STRESS TESTS  MYOCARDIAL PERFUSION IMAGING 08/09/2018  Narrative  Nuclear stress EF: 34%.  There was no ST segment deviation noted during stress.  Findings consistent with prior myocardial infarction.  This is an intermediate risk study.  The left ventricular ejection fraction is moderately decreased (30-44%).  1. EF 34% with apical and peri-apical akinesis. 2. Fixed, large, severe mid to apical anterolateral, anterior, and anteroseptal perfusion defect involving the apex and the apical inferior wall as well.  This suggests a large area of infarction in the LAD territory without significant ischemia.  Intermediate risk study with large scar and low EF.   ECHOCARDIOGRAM  ECHOCARDIOGRAM COMPLETE 12/07/2022  Narrative ECHOCARDIOGRAM REPORT    Patient Name:   Alyssa Cardenas [facility] Date of Exam: 12/07/2022 Medical Rec #:  [id number]       Height:       60.0 in Accession #:    [id number]      Weight:       208.0 lb Date of  Birth:  11-Feb-1941        BSA:          1.898 m Patient Age:    81 years        BP:           122/64 mmHg Patient Gender: F               HR:           60 bpm. Exam Location:  [address]  Procedure: 2D Echo, Cardiac Doppler and Color Doppler  Indications:    R06.00 SOB  History:        Patient has prior history of Echocardiogram examinations, most recent 12/11/2021. CHF, CAD and Previous Myocardial Infarction, Prior CABG, Obesity, Arrythmias:LBBB, Signs/Symptoms:Shortness of Breath; Risk Factors:Hypertension, Dyslipidemia, Diabetes, Dilated aortic root and Former Smoker.  Sonographer:    Samule Ohm RDCS Referring Phys: [phone]  Georgeanna Lea   Sonographer Comments: Technically difficult study due to poor echo windows and patient is obese. Image acquisition challenging due to patient body habitus. IMPRESSIONS   1. Left ventricular ejection fraction, by estimation, is 40-45%. The left ventricle has abnormal function. The left ventricle has regional wall motion abnormalitie with hypokinesis of the mid anteroseptal, entire anterolateral and mid anterioseptal walls. There is mild concentric left ventricular hypertrophy and sever basal septal hypertrophy. 2. Right ventricular systolic function is normal. The right ventricular size is mildly enlarged. 3. The mitral valve is normal in structure. Trivial mitral valve regurgitation. No evidence of mitral stenosis. 4. The aortic valve is tricuspid. Aortic valve regurgitation is not visualized. Aortic valve sclerosis is present, with no evidence of aortic valve stenosis. 5. The inferior vena cava is normal in size with greater than 50% respiratory variability, suggesting right atrial pressure of 3 mmHg.  FINDINGS Left Ventricle: Left ventricular ejection fraction, by estimation, is 40 to 45%. The left ventricle has mildly decreased function. The left ventricle demonstrates regional wall motion abnormalities. The left ventricular internal cavity size was normal in size. There is mild concentric left ventricular hypertrophy. Abnormal (paradoxical) septal motion, consistent with left bundle branch block. Left ventricular diastolic parameters are consistent with Grade I diastolic dysfunction (impaired relaxation). Normal left ventricular filling pressure.  Right Ventricle: The right ventricular size is mildly enlarged. No increase in right ventricular wall thickness. Right ventricular systolic function is normal.  Left Atrium: Left atrial size was normal in size.  Right Atrium: Right atrial size was normal in size.  Pericardium: There is no evidence of pericardial  effusion.  Mitral Valve: The mitral valve is normal in structure. Mild mitral annular calcification. Trivial mitral valve regurgitation. No evidence of mitral valve stenosis.  Tricuspid Valve: The tricuspid valve is normal in structure. Tricuspid valve regurgitation is trivial. No evidence of tricuspid stenosis.  Aortic Valve: The aortic valve is tricuspid. Aortic valve regurgitation is not visualized. Aortic valve sclerosis is present, with no evidence of aortic valve stenosis.  Pulmonic Valve: The pulmonic valve was normal in structure. Pulmonic valve regurgitation is not visualized. No evidence of pulmonic stenosis.  Aorta: The aortic root is normal in size and structure.  Venous: The inferior vena cava is normal in size with greater than 50% respiratory variability, suggesting right atrial pressure of 3 mmHg.  IAS/Shunts: No atrial level shunt detected by color flow Doppler.   LEFT VENTRICLE PLAX 2D LVIDd:         4.60 cm Diastology LVIDs:         3.20 cm  LV e' medial:    5.66 cm/s LV PW:         1.30 cm LV E/e' medial:  10.5 LV IVS:        1.80 cm LV e' lateral:   9.25 cm/s LV E/e' lateral: 6.4   RIGHT VENTRICLE             IVC RV S prime:     13.90 cm/s  IVC diam: 0.80 cm TAPSE (M-mode): 1.1 cm RVSP:           29.6 mmHg  LEFT ATRIUM             Index        RIGHT ATRIUM           Index LA diam:        3.50 cm 1.84 cm/m   RA Pressure: 3.00 mmHg LA Vol (A2C):   52.5 ml 27.66 ml/m  RA Area:     12.80 cm LA Vol (A4C):   54.6 ml 28.77 ml/m  RA Volume:   30.50 ml  16.07 ml/m LA Biplane Vol: 54.8 ml 28.87 ml/m AORTIC VALVE LVOT Vmax:   109.00 cm/s LVOT Vmean:  73.100 cm/s LVOT VTI:    0.233 m  AORTA Ao Asc diam: 3.50 cm  MITRAL VALVE                TRICUSPID VALVE MV Area (PHT): 2.36 cm     TR Peak grad:   26.6 mmHg MV Decel Time: 322 msec     TR Vmax:        258.00 cm/s MV E velocity: 59.20 cm/s   Estimated RAP:  3.00 mmHg MV A velocity: 105.00 cm/s  RVSP:            29.6 mmHg MV E/A ratio:  0.56 SHUNTS Systemic VTI: 0.23 m  Armanda Magic MD Electronically signed by Armanda Magic MD Signature Date/Time: 12/07/2022/10:48:21 AM    Final            Recent Labs: 09/15/2022: BUN 19; Creatinine, Ser 1.21; Potassium 4.3; Sodium 140  Recent Lipid Panel No results found for: "CHOL", "TRIG", "HDL", "CHOLHDL", "VLDL", "LDLCALC", "LDLDIRECT"  History of Present Illness    82 year old female with the above past medical history including CAD s/p CABG x 4 (LIMA-LAD, SVG-diagonal, SVG-OM1, SVG-PDA) in 1992, ICM, hypertension, hyperlipidemia, LBBB, CKD, and breast cancer.  She had a heart attack in 1992 with subsequent CABG x 4.  She has a history of ischemic cardiomyopathy.  She also has a history of breast cancer, surgically addressed.  Lexiscan Myoview in 2019 was negative for ischemia. Most recent echocardiogram in 12/2022 showed EF 40 to 45%, hypokinesis of the mid anteroseptal, entire anterolateral and mid anterioseptal walls, mild concentric LVH, severe basal septal hypertrophy, mildly enlarged RV with normal RV systolic function, no significant valvular abnormalities.  She transferred care from Dr. Bing Matter to Dr. Allyson Sabal due to location.  She was last seen in office on 12/22/2022 and was doing well from a cardiac standpoint.  She exercising regularly, she denied symptoms concerning for angina.  She presents today for follow-up.  Since her last visit she has done well from a cardiac standpoint. She denies any symptoms concerning for angina.  She has stable dependent nonpitting lower extremity edema, denies any dyspnea, PND, orthopnea, weight gain.  She notes she is due for her physical with her PCP later this month and will have her annual labs drawn, including her cholesterol, at  that time.  Overall, she reports feeling well.  Home Medications    Current Outpatient Medications  Medication Sig Dispense Refill   amLODipine (NORVASC) 10 MG tablet Take 10 mg by  mouth daily.      anastrozole (ARIMIDEX) 1 MG tablet TAKE 1 TABLET(1 MG) BY MOUTH DAILY 90 tablet 3   aspirin EC 81 MG tablet Take 81 mg by mouth daily.     Cholecalciferol (VITAMIN D) 50 MCG (2000 UT) CAPS Take 1 capsule (2,000 Units total) by mouth daily. 30 capsule    dimenhyDRINATE (DRAMAMINE) 50 MG tablet Take 50 mg by mouth daily as needed for dizziness.      ezetimibe (ZETIA) 10 MG tablet TAKE 1 TABLET(10 MG) BY MOUTH DAILY (Patient taking differently: Take 10 mg by mouth daily.) 90 tablet 1   fluticasone (FLONASE) 50 MCG/ACT nasal spray Place 2 sprays into both nostrils daily as needed for allergies or rhinitis.     Krill Oil 350 MG CAPS Take 350 mg by mouth daily.     loratadine (CLARITIN) 10 MG tablet Take 10 mg by mouth daily as needed for allergies.      metoprolol succinate (TOPROL-XL) 50 MG 24 hr tablet Take 1 tablet (50 mg total) by mouth daily. 90 tablet 3   nitroGLYCERIN (NITROSTAT) 0.4 MG SL tablet Place 1 tablet (0.4 mg total) under the tongue every 5 (five) minutes as needed for chest pain. 25 tablet 5   Polyvinyl Alcohol-Povidone PF 1.4-0.6 % SOLN Place 2 drops into both eyes daily as needed (for dry eyes).     potassium chloride SA (KLOR-CON) 20 MEQ tablet Take 20 mEq by mouth daily.     rosuvastatin (CRESTOR) 40 MG tablet Take 40 mg by mouth every evening.      sacubitril-valsartan (ENTRESTO) 49-51 MG TAKE 1 TABLET BY MOUTH TWICE DAILY 90 tablet 3   No current facility-administered medications for this visit.     Review of Systems    She denies chest pain, palpitations, dyspnea, pnd, orthopnea, n, v, dizziness, syncope, weight gain, or early satiety. All other systems reviewed and are otherwise negative except as noted above.   Physical Exam    VS:  BP 100/60 (BP Location: Left Arm, Patient Position: Sitting, Cuff Size: Normal)   Pulse 67   Ht 5' (1.524 m)   Wt 207 lb (93.9 kg)   SpO2 96%   BMI 40.43 kg/m  GEN: Well nourished, well developed, in no acute  distress. HEENT: normal. Neck: Supple, no JVD, carotid bruits, or masses. Cardiac: RRR, no murmurs, rubs, or gallops. No clubbing, cyanosis, edema.  Radials/DP/PT 2+ and equal bilaterally.  Respiratory:  Respirations regular and unlabored, clear to auscultation bilaterally. GI: Soft, nontender, nondistended, BS + x 4. MS: no deformity or atrophy. Skin: warm and dry, no rash. Neuro:  Strength and sensation are intact. Psych: Normal affect.  Accessory Clinical Findings    ECG personally reviewed by me today -    - no EKG in office today.  Lab Results  Component Value Date   WBC 5.2 08/14/2018   HGB 12.5 08/14/2018   HCT 42.2 08/14/2018   MCV 89.2 08/14/2018   PLT 235 08/14/2018   Lab Results  Component Value Date   CREATININE 1.21 (H) 09/15/2022   BUN 19 09/15/2022   NA 140 09/15/2022   K 4.3 09/15/2022   CL 105 09/15/2022   CO2 21 09/15/2022   Lab Results  Component Value Date   ALT 18 07/26/2018  AST 23 07/26/2018   ALKPHOS 52 07/26/2018   BILITOT 0.5 07/26/2018   No results found for: "CHOL", "HDL", "LDLCALC", "LDLDIRECT", "TRIG", "CHOLHDL"  No results found for: "HGBA1C"  Assessment & Plan   1. CAD: S/p CABG x 4 (LIMA-LAD, SVG-diagonal, SVG-OM1, SVG-PDA) in 1992. Stable with no anginal symptoms. No indication for ischemic evaluation.  Continue aspirin, metoprolol, amlodipine, Entresto, Crestor, and Zetia.  2. ICM: EF improved from 30 to 35% to 40 to 45% with initiation of Entresto.  Most recent echocardiogram in 12/2022 showed EF 40 to 45%, hypokinesis of the mid anteroseptal, entire anterolateral and mid anterioseptal walls, mild concentric LVH, severe basal septal hypertrophy, mildly enlarged RV with normal RV systolic function, no significant valvular abnormalities. Euvolemic and well compensated on exam.  Further escalation of GDMT limited in the setting of borderline low BP.  Continue current medications as above.  3. Hypertension: BP well controlled. Continue  current antihypertensive regimen.   4. Hyperlipidemia: LDL was 68 in 06/2022.  Monitored and managed per PCP (she will have repeat labs later this month).  Continue Crestor, Zetia.  5. LBBB: Chronic, stable.  6. CKD stage III: Creatinine was stable at 1.21 in 09/2022.  Repeat labs pending per PCP.  7. Disposition: Follow-up in 6 months, sooner if needed.      Joylene Grapes, NP 06/20/2023, 12:01 PM

## 2023-06-20 NOTE — Patient Instructions (Signed)
Medication Instructions:  Your physician recommends that you continue on your current medications as directed. Please refer to the Current Medication list given to you today.  *If you need a refill on your cardiac medications before your next appointment, please call your pharmacy*   Lab Work: NONE ordered at this time of appointment    Testing/Procedures: NONE ordered at this time of appointment     Follow-Up: At [facility], you and your health needs are our priority.  As part of our continuing mission to provide you with exceptional heart care, we have created designated Provider Care Teams.  These Care Teams include your primary Cardiologist (physician) and Advanced Practice Providers (APPs -  Physician Assistants and Nurse Practitioners) who all work together to provide you with the care you need, when you need it.  We recommend signing up for the patient portal called "MyChart".  Sign up information is provided on this After Visit Summary.  MyChart is used to connect with patients for Virtual Visits (Telemedicine).  Patients are able to view lab/test results, encounter notes, upcoming appointments, etc.  Non-urgent messages can be sent to your provider as well.   To learn more about what you can do with MyChart, go to [email or web address].    Your next appointment:   6 month(s)  Provider:   Nanetta Batty, MD

## 2023-06-30 LAB — COMPREHENSIVE METABOLIC PANEL: EGFR: 43

## 2023-07-13 ENCOUNTER — Ambulatory Visit: Payer: Medicare Other

## 2023-08-10 ENCOUNTER — Ambulatory Visit
Admission: RE | Admit: 2023-08-10 | Discharge: 2023-08-10 | Disposition: A | Discharge status: Home / Self Care | Payer: Medicare Other | Source: Ambulatory Visit | Attending: Hematology and Oncology

## 2023-08-10 DIAGNOSIS — Z1231 Encounter for screening mammogram for malignant neoplasm of breast: Secondary | ICD-10-CM

## 2023-09-13 ENCOUNTER — Other Ambulatory Visit: Payer: Self-pay | Admitting: Cardiovascular Disease

## 2023-11-07 ENCOUNTER — Other Ambulatory Visit: Payer: Self-pay | Admitting: Cardiovascular Disease

## 2023-11-08 ENCOUNTER — Other Ambulatory Visit: Payer: Self-pay | Admitting: Cardiology

## 2023-11-11 ENCOUNTER — Telehealth: Payer: Self-pay | Admitting: Cardiovascular Disease

## 2023-11-11 NOTE — Telephone Encounter (Signed)
*  STAT* If patient is at the pharmacy, call can be transferred to refill team.   1. Which medications need to be refilled? (please list name of each medication and dose if known)   metoprolol succinate (TOPROL-XL) 50 MG 24 hr tablet   2. Would you like to learn more about the convenience, safety, & potential cost savings by using the [facility]?   3. Are you open to using the [facility] (Type [facility]. ).  4. Which pharmacy/location (including street and city if local pharmacy) is medication to be sent to?  [id number] - [place], [place] - [address] AT [address]   5. Do they need a 30 day or 90 day supply?   90 day  Patient stated she is almost  out of this medication.

## 2023-11-11 NOTE — Telephone Encounter (Signed)
 Pt's medication has already been sent to pt's pharmacy as requested.

## 2023-12-26 ENCOUNTER — Encounter: Payer: Self-pay | Admitting: Cardiovascular Disease

## 2023-12-26 ENCOUNTER — Ambulatory Visit: Payer: Medicare Other | Attending: Cardiovascular Disease | Admitting: Cardiovascular Disease

## 2023-12-26 VITALS — BP 116/82 | HR 64 | Ht 60.0 in | Wt 205.0 lb

## 2023-12-26 DIAGNOSIS — I2510 Atherosclerotic heart disease of native coronary artery without angina pectoris: Secondary | ICD-10-CM | POA: Diagnosis not present

## 2023-12-26 DIAGNOSIS — E782 Mixed hyperlipidemia: Secondary | ICD-10-CM | POA: Diagnosis not present

## 2023-12-26 DIAGNOSIS — I255 Ischemic cardiomyopathy: Secondary | ICD-10-CM

## 2023-12-26 DIAGNOSIS — I119 Hypertensive heart disease without heart failure: Secondary | ICD-10-CM

## 2023-12-26 DIAGNOSIS — I447 Left bundle-branch block, unspecified: Secondary | ICD-10-CM

## 2023-12-26 MED ORDER — NITROGLYCERIN 0.4 MG SL SUBL: 0.4000 mg | SUBLINGUAL_TABLET | SUBLINGUAL | 3 refills | Status: AC | PRN

## 2023-12-26 NOTE — Assessment & Plan Note (Signed)
 History of essential hypertension with blood pressure measured today at 116/82.  She is on amlodipine , metoprolol  and Entresto .

## 2023-12-26 NOTE — Assessment & Plan Note (Signed)
 History of hyperlipidemia on Zetia  and high-dose statin therapy with lipid profile performed 06/26/2023 revealing total cholesterol 156, LDL 67 HDL 75.

## 2023-12-26 NOTE — Assessment & Plan Note (Signed)
 History of CAD status post bypass surgery by Dr. Elvan Hamel in 1992 with a LIMA to LAD, vein to diagonal branch and sequential vein to OM1 and to the PDA.  She denies chest pain or shortness of breath.

## 2023-12-26 NOTE — Patient Instructions (Signed)
 Medication Instructions:  Your physician recommends that you continue on your current medications as directed. Please refer to the Current Medication list given to you today.  *If you need a refill on your cardiac medications before your next appointment, please call your pharmacy*   Follow-Up: At [facility], you and your health needs are our priority.  As part of our continuing mission to provide you with exceptional heart care, our providers are all part of one team.  This team includes your primary Cardiologist (physician) and Advanced Practice Providers or APPs (Physician Assistants and Nurse Practitioners) who all work together to provide you with the care you need, when you need it.  Your next appointment:   6 month(s)  Provider:   Marlana Silvan, NP       Then, Lauro Portal, MD will plan to see you again in 12 month(s).    We recommend signing up for the patient portal called "MyChart".  Sign up information is provided on this After Visit Summary.  MyChart is used to connect with patients for Virtual Visits (Telemedicine).  Patients are able to view lab/test results, encounter notes, upcoming appointments, etc.  Non-urgent messages can be sent to your provider as well.   To learn more about what you can do with MyChart, go to [email or web address].   Other Instructions       1st Floor: - Lobby - Registration  - Pharmacy  - Lab - Cafe  2nd Floor: - PV Lab - Diagnostic Testing (echo, CT, nuclear med)  3rd Floor: - Vacant  4th Floor: - TCTS (cardiothoracic surgery) - AFib Clinic - Structural Heart Clinic - Vascular Surgery  - Vascular Ultrasound  5th Floor: - HeartCare Cardiology (general and EP) - Clinical Pharmacy for coumadin, hypertension, lipid, weight-loss medications, and med management appointments    Valet parking services will be available as well.

## 2023-12-26 NOTE — Assessment & Plan Note (Signed)
 Chronic.

## 2023-12-26 NOTE — Assessment & Plan Note (Signed)
 Ischemic cardiomyopathy with an EF in the 40% range by 2D echo most recently performed/2/24.  She is on GDMT and is asymptomatic.

## 2023-12-26 NOTE — Progress Notes (Signed)
 12/26/2023 Alyssa Cardenas   11/18/1940  [id number]  Primary Physician Barnetta Liberty, MD Primary Cardiologist: Avanell Leigh MD Bennye Bravo, [place]  HPI:  Alyssa Cardenas is a 83 y.o.  mildly overweight married African-American female mother of 1 living child, 1 son died at age 31 with a myocardial infarction, grandmother to 3 grandchildren who is transitioning her care from Dr. Gordan Latina to myself because of location and proximity.  I last saw her in the office 12/22/22.  She is a retired Armed forces operational officer. Her risk factors include treated hypertension and hyperlipidemia. She is not diabetic. Her family history is markable for mother and brother both who had bypass surgery. She apparently had a heart attack back in 1992 and subsequent CABG x 4 by Dr. Arville Laughter with a LIMA to her LAD, vein to a diagonal branch, sequential vein to OM1 into and to the PDA. She does have ischemic cardiomyopathy with an EF in the 40-40 GDMT. She Also Had Breast Cancer Surgically Addressed. She Is Fairly Active and Exercises without Limitation.  Since I saw her a year ago she has remained stable.  She continues to exercise and is asymptomatic.   Current Meds  Medication Sig   amLODipine  (NORVASC ) 10 MG tablet Take 10 mg by mouth daily.    aspirin EC 81 MG tablet Take 81 mg by mouth as needed.   Cholecalciferol (VITAMIN D ) 50 MCG (2000 UT) CAPS Take 1 capsule (2,000 Units total) by mouth daily.   dimenhyDRINATE (DRAMAMINE) 50 MG tablet Take 50 mg by mouth daily as needed for dizziness.    ezetimibe  (ZETIA ) 10 MG tablet TAKE 1 TABLET(10 MG) BY MOUTH DAILY (Patient taking differently: Take 10 mg by mouth daily.)   fluticasone (FLONASE) 50 MCG/ACT nasal spray Place 2 sprays into both nostrils daily as needed for allergies or rhinitis.   Krill Oil 350 MG CAPS Take 350 mg by mouth daily.   loratadine (CLARITIN) 10 MG tablet Take 10 mg by mouth daily as needed for allergies.    metoprolol  succinate  (TOPROL -XL) 50 MG 24 hr tablet TAKE 1 TABLET(50 MG) BY MOUTH DAILY   Polyvinyl Alcohol-Povidone PF 1.4-0.6 % SOLN Place 2 drops into both eyes daily as needed (for dry eyes).   potassium chloride  SA (KLOR-CON ) 20 MEQ tablet Take 20 mEq by mouth daily.   rosuvastatin  (CRESTOR ) 40 MG tablet Take 40 mg by mouth every evening.    sacubitril-valsartan (ENTRESTO ) 49-51 MG TAKE 1 TABLET BY MOUTH TWICE DAILY   [DISCONTINUED] nitroGLYCERIN  (NITROSTAT ) 0.4 MG SL tablet Place 1 tablet (0.4 mg total) under the tongue every 5 (five) minutes as needed for chest pain.     Allergies  Allergen Reactions   Ace Inhibitors Cough and Other (See Comments)   Latex Hives and Other (See Comments)   Tamoxifen  Tinitus and Other (See Comments)    Social History   Socioeconomic History   Marital status: Married    Spouse name: Not on file   Number of children: Not on file   Years of education: Not on file   Highest education level: Not on file  Occupational History   Not on file  Tobacco Use   Smoking status: Former    Current packs/day: 0.00    Types: Cigarettes    Quit date: 03/10/1975    Years since quitting: 48.8   Smokeless tobacco: Never  Vaping Use   Vaping status: Never Used  Substance and Sexual Activity  Alcohol use: Yes    Alcohol/week: 0.0 standard drinks of alcohol    Comment: Special occasions   Drug use: No   Sexual activity: Yes    Birth control/protection: Post-menopausal  Other Topics Concern   Not on file  Social History Narrative   Not on file   Social Drivers of Health   Financial Resource Strain: Not on file  Food Insecurity: Not on file  Transportation Needs: Not on file  Physical Activity: Not on file  Stress: Not on file  Social Connections: Not on file  Intimate Partner Violence: Not on file     Review of Systems: General: negative for chills, fever, night sweats or weight changes.  Cardiovascular: negative for chest pain, dyspnea on exertion, edema, orthopnea,  palpitations, paroxysmal nocturnal dyspnea or shortness of breath Dermatological: negative for rash Respiratory: negative for cough or wheezing Urologic: negative for hematuria Abdominal: negative for nausea, vomiting, diarrhea, bright red blood per rectum, melena, or hematemesis Neurologic: negative for visual changes, syncope, or dizziness All other systems reviewed and are otherwise negative except as noted above.    Blood pressure 116/82, pulse 64, height 5' (1.524 m), weight 205 lb (93 kg), SpO2 98%.  General appearance: alert and no distress Neck: no adenopathy, no carotid bruit, no JVD, supple, symmetrical, trachea midline, and thyroid not enlarged, symmetric, no tenderness/mass/nodules Lungs: clear to auscultation bilaterally Heart: regular rate and rhythm, S1, S2 normal, no murmur, click, rub or gallop Extremities: extremities normal, atraumatic, no cyanosis or edema Pulses: 2+ and symmetric Skin: Skin color, texture, turgor normal. No rashes or lesions Neurologic: Grossly normal  EKG EKG Interpretation Date/Time:  Monday December 26 2023 11:23:39 EDT Ventricular Rate:  64 PR Interval:    QRS Duration:  168 QT Interval:  458 QTC Calculation: 472 R Axis:   4  Text Interpretation: Wide QRS rhythm Left bundle branch block No previous ECGs available Confirmed by Lauro Portal [phone]) on 12/26/2023 11:30:19 AM    ASSESSMENT AND PLAN:   Hypertensive heart disease  History of essential hypertension with blood pressure measured today at 116/82.  She is on amlodipine , metoprolol  and Entresto .  Hyperlipidemia History of hyperlipidemia on Zetia  and high-dose statin therapy with lipid profile performed 06/26/2023 revealing total cholesterol 156, LDL 67 HDL 75.  CAD (coronary artery disease), native coronary artery History of CAD status post bypass surgery by Dr. Elvan Hamel in 1992 with a LIMA to LAD, vein to diagonal branch and sequential vein to OM1 and to the PDA.  She denies chest  pain or shortness of breath.  Cardiomyopathy (HCC) Ischemic cardiomyopathy with an EF in the 40% range by 2D echo most recently performed/2/24.  She is on GDMT and is asymptomatic.  Left bundle branch block Chronic     Avanell Leigh MD [facility], [facility] 12/26/2023 11:38 AM

## 2024-02-04 ENCOUNTER — Encounter (HOSPITAL_COMMUNITY): Payer: Self-pay

## 2024-02-04 ENCOUNTER — Other Ambulatory Visit: Payer: Self-pay

## 2024-02-04 ENCOUNTER — Emergency Department (HOSPITAL_COMMUNITY)

## 2024-02-04 ENCOUNTER — Inpatient Hospital Stay (HOSPITAL_COMMUNITY)
Admission: EM | Admit: 2024-02-04 | Discharge: 2024-02-06 | DRG: 065 | Disposition: A | Discharge status: Home / Self Care | Attending: Internal Medicine | Admitting: Internal Medicine

## 2024-02-04 ENCOUNTER — Inpatient Hospital Stay (HOSPITAL_COMMUNITY)

## 2024-02-04 DIAGNOSIS — Z79811 Long term (current) use of aromatase inhibitors: Secondary | ICD-10-CM | POA: Diagnosis not present

## 2024-02-04 DIAGNOSIS — I5022 Chronic systolic (congestive) heart failure: Secondary | ICD-10-CM | POA: Diagnosis present

## 2024-02-04 DIAGNOSIS — I6503 Occlusion and stenosis of bilateral vertebral arteries: Secondary | ICD-10-CM | POA: Diagnosis present

## 2024-02-04 DIAGNOSIS — N189 Chronic kidney disease, unspecified: Secondary | ICD-10-CM | POA: Diagnosis not present

## 2024-02-04 DIAGNOSIS — Z87891 Personal history of nicotine dependence: Secondary | ICD-10-CM

## 2024-02-04 DIAGNOSIS — N1831 Chronic kidney disease, stage 3a: Secondary | ICD-10-CM | POA: Diagnosis present

## 2024-02-04 DIAGNOSIS — E669 Obesity, unspecified: Secondary | ICD-10-CM | POA: Diagnosis present

## 2024-02-04 DIAGNOSIS — F419 Anxiety disorder, unspecified: Secondary | ICD-10-CM | POA: Diagnosis present

## 2024-02-04 DIAGNOSIS — Z6839 Body mass index (BMI) 39.0-39.9, adult: Secondary | ICD-10-CM

## 2024-02-04 DIAGNOSIS — Z823 Family history of stroke: Secondary | ICD-10-CM

## 2024-02-04 DIAGNOSIS — I739 Peripheral vascular disease, unspecified: Secondary | ICD-10-CM

## 2024-02-04 DIAGNOSIS — R29810 Facial weakness: Secondary | ICD-10-CM | POA: Diagnosis present

## 2024-02-04 DIAGNOSIS — C801 Malignant (primary) neoplasm, unspecified: Secondary | ICD-10-CM | POA: Diagnosis not present

## 2024-02-04 DIAGNOSIS — I2510 Atherosclerotic heart disease of native coronary artery without angina pectoris: Secondary | ICD-10-CM | POA: Diagnosis present

## 2024-02-04 DIAGNOSIS — I63512 Cerebral infarction due to unspecified occlusion or stenosis of left middle cerebral artery: Secondary | ICD-10-CM | POA: Diagnosis present

## 2024-02-04 DIAGNOSIS — I447 Left bundle-branch block, unspecified: Secondary | ICD-10-CM | POA: Diagnosis present

## 2024-02-04 DIAGNOSIS — Z7902 Long term (current) use of antithrombotics/antiplatelets: Secondary | ICD-10-CM | POA: Diagnosis not present

## 2024-02-04 DIAGNOSIS — I429 Cardiomyopathy, unspecified: Secondary | ICD-10-CM | POA: Diagnosis not present

## 2024-02-04 DIAGNOSIS — R270 Ataxia, unspecified: Secondary | ICD-10-CM | POA: Diagnosis present

## 2024-02-04 DIAGNOSIS — R471 Dysarthria and anarthria: Secondary | ICD-10-CM | POA: Diagnosis present

## 2024-02-04 DIAGNOSIS — G8191 Hemiplegia, unspecified affecting right dominant side: Secondary | ICD-10-CM | POA: Diagnosis present

## 2024-02-04 DIAGNOSIS — Z8249 Family history of ischemic heart disease and other diseases of the circulatory system: Secondary | ICD-10-CM

## 2024-02-04 DIAGNOSIS — I639 Cerebral infarction, unspecified: Principal | ICD-10-CM | POA: Diagnosis present

## 2024-02-04 DIAGNOSIS — Z7982 Long term (current) use of aspirin: Secondary | ICD-10-CM | POA: Diagnosis not present

## 2024-02-04 DIAGNOSIS — I129 Hypertensive chronic kidney disease with stage 1 through stage 4 chronic kidney disease, or unspecified chronic kidney disease: Secondary | ICD-10-CM

## 2024-02-04 DIAGNOSIS — I130 Hypertensive heart and chronic kidney disease with heart failure and stage 1 through stage 4 chronic kidney disease, or unspecified chronic kidney disease: Secondary | ICD-10-CM | POA: Diagnosis present

## 2024-02-04 DIAGNOSIS — I252 Old myocardial infarction: Secondary | ICD-10-CM | POA: Diagnosis not present

## 2024-02-04 DIAGNOSIS — I255 Ischemic cardiomyopathy: Secondary | ICD-10-CM | POA: Diagnosis present

## 2024-02-04 DIAGNOSIS — Z79899 Other long term (current) drug therapy: Secondary | ICD-10-CM

## 2024-02-04 DIAGNOSIS — E785 Hyperlipidemia, unspecified: Secondary | ICD-10-CM | POA: Diagnosis present

## 2024-02-04 DIAGNOSIS — I69391 Dysphagia following cerebral infarction: Secondary | ICD-10-CM | POA: Diagnosis not present

## 2024-02-04 DIAGNOSIS — R1310 Dysphagia, unspecified: Secondary | ICD-10-CM | POA: Diagnosis present

## 2024-02-04 DIAGNOSIS — R29705 NIHSS score 5: Secondary | ICD-10-CM | POA: Diagnosis present

## 2024-02-04 DIAGNOSIS — I6389 Other cerebral infarction: Secondary | ICD-10-CM | POA: Diagnosis not present

## 2024-02-04 DIAGNOSIS — Z86000 Personal history of in-situ neoplasm of breast: Secondary | ICD-10-CM | POA: Diagnosis not present

## 2024-02-04 DIAGNOSIS — Z951 Presence of aortocoronary bypass graft: Secondary | ICD-10-CM

## 2024-02-04 LAB — CBC
HCT: 40.5 % (ref 36.0–46.0)
Hemoglobin: 12.7 g/dL (ref 12.0–15.0)
MCH: 27.5 pg (ref 26.0–34.0)
MCHC: 31.4 g/dL (ref 30.0–36.0)
MCV: 87.9 fL (ref 80.0–100.0)
Platelets: 193 10*3/uL (ref 150–400)
RBC: 4.61 MIL/uL (ref 3.87–5.11)
RDW: 15.2 % (ref 11.5–15.5)
WBC: 4.8 10*3/uL (ref 4.0–10.5)
nRBC: 0 % (ref 0.0–0.2)

## 2024-02-04 LAB — COMPREHENSIVE METABOLIC PANEL WITH GFR
ALT: 19 U/L (ref 0–44)
AST: 32 U/L (ref 15–41)
Albumin: 3.6 g/dL (ref 3.5–5.0)
Alkaline Phosphatase: 47 U/L (ref 38–126)
Anion gap: 11 (ref 5–15)
BUN: 16 mg/dL (ref 8–23)
CO2: 21 mmol/L — ABNORMAL LOW (ref 22–32)
Calcium: 9.1 mg/dL (ref 8.9–10.3)
Chloride: 106 mmol/L (ref 98–111)
Creatinine, Ser: 1.17 mg/dL — ABNORMAL HIGH (ref 0.44–1.00)
GFR, Estimated: 47 mL/min — ABNORMAL LOW (ref >60)
Glucose, Bld: 116 mg/dL — ABNORMAL HIGH (ref 70–99)
Potassium: 3.5 mmol/L (ref 3.5–5.1)
Sodium: 138 mmol/L (ref 135–145)
Total Bilirubin: 0.6 mg/dL (ref 0.0–1.2)
Total Protein: 6.9 g/dL (ref 6.5–8.1)

## 2024-02-04 LAB — I-STAT CHEM 8, ED
BUN: 17 mg/dL (ref 8–23)
Calcium, Ion: 1.15 mmol/L (ref 1.15–1.40)
Chloride: 107 mmol/L (ref 98–111)
Creatinine, Ser: 1.2 mg/dL — ABNORMAL HIGH (ref 0.44–1.00)
Glucose, Bld: 115 mg/dL — ABNORMAL HIGH (ref 70–99)
HCT: 39 % (ref 36.0–46.0)
Hemoglobin: 13.3 g/dL (ref 12.0–15.0)
Potassium: 3.6 mmol/L (ref 3.5–5.1)
Sodium: 140 mmol/L (ref 135–145)
TCO2: 24 mmol/L (ref 22–32)

## 2024-02-04 LAB — PROTIME-INR
INR: 1.1 (ref 0.8–1.2)
Prothrombin Time: 13.9 s (ref 11.4–15.2)

## 2024-02-04 LAB — ETHANOL: Alcohol, Ethyl (B): <15 mg/dL (ref <15)

## 2024-02-04 LAB — DIFFERENTIAL
Abs Immature Granulocytes: 0.01 10*3/uL (ref 0.00–0.07)
Basophils Absolute: 0.1 10*3/uL (ref 0.0–0.1)
Basophils Relative: 1 %
Eosinophils Absolute: 0.2 10*3/uL (ref 0.0–0.5)
Eosinophils Relative: 3 %
Immature Granulocytes: 0 %
Lymphocytes Relative: 37 %
Lymphs Abs: 1.8 10*3/uL (ref 0.7–4.0)
Monocytes Absolute: 0.4 10*3/uL (ref 0.1–1.0)
Monocytes Relative: 9 %
Neutro Abs: 2.4 10*3/uL (ref 1.7–7.7)
Neutrophils Relative %: 50 %

## 2024-02-04 LAB — HEMOGLOBIN A1C
Hgb A1c MFr Bld: 5.7 % — ABNORMAL HIGH (ref 4.8–5.6)
Mean Plasma Glucose: 116.89 mg/dL

## 2024-02-04 LAB — CBG MONITORING, ED: Glucose-Capillary: 107 mg/dL — ABNORMAL HIGH (ref 70–99)

## 2024-02-04 LAB — APTT: aPTT: 31 s (ref 24–36)

## 2024-02-04 MED ORDER — EZETIMIBE 10 MG PO TABS
10.0000 mg | ORAL_TABLET | Freq: Every day | ORAL | Status: DC
  Administered 2024-02-05 – 2024-02-06 (×2): 10 mg via ORAL
  Filled 2024-02-04 (×2): qty 1

## 2024-02-04 MED ORDER — ASPIRIN 81 MG PO CHEW
81.0000 mg | CHEWABLE_TABLET | Freq: Every day | ORAL | Status: DC
  Administered 2024-02-04 – 2024-02-06 (×3): 81 mg via ORAL
  Filled 2024-02-04 (×3): qty 1

## 2024-02-04 MED ORDER — SODIUM CHLORIDE 0.9% FLUSH: 3.0000 mL | Freq: Once | INTRAVENOUS | Status: DC

## 2024-02-04 MED ORDER — METOPROLOL SUCCINATE ER 25 MG PO TB24: 50.0000 mg | ORAL_TABLET | Freq: Every day | ORAL | Status: DC

## 2024-02-04 MED ORDER — ROSUVASTATIN CALCIUM 20 MG PO TABS
40.0000 mg | ORAL_TABLET | Freq: Every evening | ORAL | Status: DC
  Administered 2024-02-05: 40 mg via ORAL
  Filled 2024-02-04: qty 2

## 2024-02-04 MED ORDER — STROKE: EARLY STAGES OF RECOVERY BOOK
Freq: Once | Status: AC
  Filled 2024-02-04: qty 1

## 2024-02-04 MED ORDER — CLOPIDOGREL BISULFATE 75 MG PO TABS
75.0000 mg | ORAL_TABLET | Freq: Every day | ORAL | Status: DC
  Administered 2024-02-04 – 2024-02-06 (×3): 75 mg via ORAL
  Filled 2024-02-04 (×3): qty 1

## 2024-02-04 MED ORDER — ASPIRIN 81 MG PO TBEC: 81.0000 mg | DELAYED_RELEASE_TABLET | Freq: Every day | ORAL | Status: DC

## 2024-02-04 MED ORDER — IOHEXOL 350 MG/ML SOLN
75.0000 mL | Freq: Once | INTRAVENOUS | Status: AC | PRN
  Administered 2024-02-04: 75 mL via INTRAVENOUS

## 2024-02-04 MED ORDER — SACUBITRIL-VALSARTAN 49-51 MG PO TABS: 1.0000 | ORAL_TABLET | Freq: Two times a day (BID) | ORAL | Status: DC

## 2024-02-04 MED ORDER — VITAMIN D 25 MCG (1000 UNIT) PO TABS
2000.0000 [IU] | ORAL_TABLET | Freq: Every day | ORAL | Status: DC
  Administered 2024-02-05 – 2024-02-06 (×2): 2000 [IU] via ORAL
  Filled 2024-02-04 (×4): qty 2

## 2024-02-04 MED ORDER — AMLODIPINE BESYLATE 5 MG PO TABS: 10.0000 mg | ORAL_TABLET | Freq: Every day | ORAL | Status: DC

## 2024-02-04 MED ORDER — LACTATED RINGERS IV SOLN: INTRAVENOUS | Status: AC

## 2024-02-04 NOTE — Plan of Care (Signed)

## 2024-02-04 NOTE — Code Documentation (Signed)
 Stroke Response Nurse Documentation Code Documentation  Alyssa Cardenas is a 83 y.o. female arriving to [name]  via [facility] as Code Stroke Activation. Not on any anticoagulants. LKW 0900 when she woke up. After laying back down, she later woke up with a right facial droop and slurred speech.   Team met patient at bridge on arrival, labs drawn, airway cleared and pt taken to CT. NIH 5, see flowsheet for details. CT/CTA completed--minor delay for CTA start while waiting for istat result. OOW TNK, no LVO. Care Plan: q2h NIH and vitals. Bedside handoff with ED RN Jyl Or.    Alyssa Cardenas K  Rapid Response RN

## 2024-02-04 NOTE — ED Notes (Signed)
 Pt return from MRI

## 2024-02-04 NOTE — ED Triage Notes (Signed)
 Pt came in as a code stroke. LSN 0900. Deficits- Right sided weakness, right sided facial droop, and slurred speech. Axox4.

## 2024-02-04 NOTE — Progress Notes (Signed)
 [facility] night cross cover note:   I was notified by the patient's RN of the patient's family's request for IV fluids.  Patient is here with acute ischemic CVA, and has failed her RN swallow screen. Per chart review, there appears to be an existing order for ST consultation.   Patient's family concerned about the patient becoming dehydrated in the setting of her current n.p.o. status, concerned about potential for worsening stroke in the setting of intravascular hypovolemia.   Per patient's family's request, I have started LR, but at a gentle rate of 50 cc's per day x 10 hours given a history of chronic diastolic heart failure.  Per chart review, most recent respiratory rate was 16-20, with oxygen saturation 96 to 100% on room air.      Camelia Cavalier, DO Hospitalist

## 2024-02-04 NOTE — H&P (Signed)
 History and Physical    Patient: Alyssa Cardenas UJW:[id number] DOB: 05/22/1941 DOA: 02/04/2024 DOS: the patient was seen and examined on 02/04/2024 PCP: Barnetta Liberty, MD  Patient coming from: Home  Chief Complaint:  Chief Complaint  Patient presents with   Code Stroke   HPI: Alyssa Cardenas is a 83 y.o. female with medical history significant of CAD s/p CABG, ICM, HFpEF, HTN, HLD, and breast cancer p/w c/f acute stroke.  Pt states she was in her USOH until waking up at 12-1p today and noting heaviness in her RUE. Pt states that she remembers waking up at 0900 and was able to go to restroom and flush the toilet with no issues at that time (NOTE: Pt is R hand dominant). Pt returned to bed and upon waking was having difficulty lifting her R arm. She became increasingly concerned after navigating to the restroom w/o much difficulty; she was unable to brush her teeth because she kept dropping her toothbrush. Fearing she had a stroke, she woke up her husband who noticed the R facial droop, her slurred speech, and activated EMS.  In the ED, pt AFVSS. Labs notable for Cr 1.2. CT head showed no large vessel disease, but did note "moderate stenosis at the bilateral vertebral artery origins due to calcified plaque." Pt admitted to medicine for ongoing w/u w/ Neurology following.  Review of Systems: As mentioned in the history of present illness. All other systems reviewed and are negative. Past Medical History:  Diagnosis Date   Anxiety    Breast cancer (HCC)    CAD (coronary artery disease), native coronary artery    Cath 08/27/1991 Normal Left main, calcified proximal LAD, 50% stenosis proximal LAD, 90% stenosis mid LAD, occluded Diag 1, 95% stenosis proximal Diag 2, 95% stenosis proximal CFX, 95% stenosis proximal OM 1, occluded RCA;  CABG w LIMA to LAD, SVG to dx, SVG to OM1-2-circ, SVG to PDA 08/29/1991 Dr. 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 CABG  EF     Hyperlipidemia    Hypertensive heart disease     Insomnia 11/20/2021   Left bundle branch block 08/01/2018   Myocardial infarction (HCC)    Obesity (BMI 30-39.9)    Preop cardiovascular exam 08/01/2018   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   1992   Vitamin D  deficiency 03/10/2015   Past Surgical History:  Procedure Laterality Date   BREAST BIOPSY     BREAST EXCISIONAL BIOPSY Bilateral    BREAST LUMPECTOMY Left    2019 DCIS   BREAST LUMPECTOMY WITH RADIOACTIVE SEED LOCALIZATION Left 08/18/2018   Procedure: LEFT BREAST LUMPECTOMY WITH RADIOACTIVE SEED LOCALIZATION;  Surgeon: Caralyn Chandler, MD;  Location: [facility] OR;  Service: General;  Laterality: Left;   CORONARY ARTERY BYPASS GRAFT  1992   EYE SURGERY     cataract removal 2017   TONSILLECTOMY     Social History:  reports that she quit smoking about 48 years ago. Her smoking use included cigarettes. She has never used smokeless tobacco. She reports current alcohol use. She reports that she does not use drugs.  Allergies  Allergen Reactions   Ace Inhibitors Cough and Other (See Comments)  Latex Hives and Other (See Comments)   Tamoxifen  Tinitus and Other (See Comments)    Family History  Problem Relation Age of Onset   Heart disease Mother    Hyperlipidemia Mother    Hypertension Mother    Heart disease Father    Hypertension Father    Lung cancer Father    Diabetes Maternal Grandmother    Stroke Maternal Grandfather    Stroke Paternal Grandmother    Heart disease Paternal Grandfather    Heart disease Brother    Hyperlipidemia Brother    Hypertension Brother    Mental illness Brother    Heart disease Brother    Hyperlipidemia Brother    Hypertension Brother    BRCA 1/2 Neg Hx     Breast cancer Neg Hx     Prior to Admission medications   Medication Sig Start Date End Date Taking? Authorizing Provider  amLODipine  (NORVASC ) 10 MG tablet Take 10 mg by mouth daily.     [provider]  anastrozole  (ARIMIDEX ) 1 MG tablet TAKE 1 TABLET(1 MG) BY MOUTH DAILY Patient not taking: Reported on 12/26/2023 02/11/23   Gudena, Vinay, MD  aspirin EC 81 MG tablet Take 81 mg by mouth as needed.    [provider]  Cholecalciferol (VITAMIN D ) 50 MCG (2000 UT) CAPS Take 1 capsule (2,000 Units total) by mouth daily. 03/05/21   Gudena, Vinay, MD  dimenhyDRINATE (DRAMAMINE) 50 MG tablet Take 50 mg by mouth daily as needed for dizziness.     [provider]  ezetimibe  (ZETIA ) 10 MG tablet TAKE 1 TABLET(10 MG) BY MOUTH DAILY Patient taking differently: Take 10 mg by mouth daily. 04/03/20   Krasowski, Robert J, MD  fluticasone (FLONASE) 50 MCG/ACT nasal spray Place 2 sprays into both nostrils daily as needed for allergies or rhinitis.    [provider]  Oksana Bergamo Oil 350 MG CAPS Take 350 mg by mouth daily.    [provider]  loratadine (CLARITIN) 10 MG tablet Take 10 mg by mouth daily as needed for allergies.     [provider]  metoprolol  succinate (TOPROL -XL) 50 MG 24 hr tablet TAKE 1 TABLET(50 MG) BY MOUTH DAILY 11/10/23   Avanell Leigh, MD  nitroGLYCERIN  (NITROSTAT ) 0.4 MG SL tablet Place 1 tablet (0.4 mg total) under the tongue every 5 (five) minutes as needed for chest pain. 12/26/23   Avanell Leigh, MD  Polyvinyl Alcohol-Povidone PF 1.4-0.6 % SOLN Place 2 drops into both eyes daily as needed (for dry eyes).    [provider]  potassium chloride  SA (KLOR-CON ) 20 MEQ tablet Take 20 mEq by mouth daily. 12/26/20   [provider]  rosuvastatin  (CRESTOR ) 40 MG tablet Take 40 mg by mouth every evening.     [provider]  sacubitril-valsartan (ENTRESTO ) 49-51 MG TAKE 1 TABLET BY MOUTH TWICE DAILY 09/14/23   Avanell Leigh, MD    Physical Exam: Vitals:   02/04/24 1530 02/04/24 1600 02/04/24 1615 02/04/24 1630  BP: (!) 141/75 139/83 131/75 134/77  Pulse: 66 62 61 61  Resp: 15 11 12 10   Temp:      TempSrc:      SpO2: 100% 100% 98% 100%  Weight:      Height:       General: Alert, oriented x3, resting comfortably in no acute distress HEENT: EOMI, oropharynx clear, moist mucous membranes, hearing intact Neck: Trachea midline and no gross thyromegaly Respiratory: Lungs clear to auscultation bilaterally with  normal respiratory effort; no w/r/r Cardiovascular: Regular rate and rhythm w/o m/r/g Abdomen: Soft, nontender, nondistended. Positive bowel sounds MSK: No obvious joint deformities or swelling Skin: No obvious rashes or lesions Neurologic: Awake, alert, spontaneously moves all extremities, strength intact Psychiatric: Appropriate mood and affect, conversational and cooperative   Data Reviewed:  Lab Results  Component Value Date   WBC 4.8 02/04/2024   HGB 13.3 02/04/2024   HCT 39.0 02/04/2024   MCV 87.9 02/04/2024   PLT 193 02/04/2024   Lab Results  Component Value Date   GLUCOSE 115 (H) 02/04/2024   CALCIUM 9.1 02/04/2024   NA 140 02/04/2024   K 3.6 02/04/2024   CO2 21 (L) 02/04/2024   CL 107 02/04/2024   BUN 17 02/04/2024   CREATININE 1.20 (H) 02/04/2024   Lab Results  Component Value Date   ALT 19 02/04/2024   AST 32 02/04/2024   ALKPHOS 47 02/04/2024   BILITOT 0.6 02/04/2024   Lab Results  Component Value Date   INR 1.1 02/04/2024    Radiology: CT ANGIO HEAD NECK W WO CM (CODE STROKE) Result Date: 02/04/2024 EXAM: CTA Head and Neck with Intravenous Contrast. CLINICAL HISTORY: Neuro deficit, acute, stroke suspected. TECHNIQUE: Axial CTA images of the head and neck performed with intravenous contrast. Two-dimensional MIP and/or three-dimensional MIP and volume rendered reformations were performed. Note: Per PQRS, the description of internal carotid artery percent  stenosis, including 0 percent or normal exam, is based on [place] American Symptomatic Carotid Endarterectomy Trial (NASCET) criteria. Dose reduction technique was used including one or more of the following: automated exposure control, adjustment of mA and kV according to patient size, and/or iterative reconstruction. CONTRAST: With; 75mL (iohexol (OMNIPAQUE) 350 MG/ML injection 75 mL IOHEXOL 350 MG/ML SOLN) COMPARISON: Head CT 02/03/2020. FINDINGS: CTA NECK: COMMON CAROTID ARTERIES: Calcified plaque along the bilateral carotid bulbs and hemodynamically significant stenosis. 2-vessel arch configuration with common origin of the right brachiocephalic artery and left common carotid artery. INTERNAL CAROTID ARTERIES: Atherosclerotic calcifications of the carotid siphons without significant stenosis or aneurysm. VERTEBRAL ARTERIES: Calcified plaque at the bilateral vertebral artery origins results in moderate stenosis bilaterally. Mild calcified plaque along the right V3/V4 junction and proximal left V4 segment. CTA HEAD: No large vessel occlusion. ANTERIOR CEREBRAL ARTERIES: No significant stenosis. No occlusion. No aneurysm. MIDDLE CEREBRAL ARTERIES: No significant stenosis. No occlusion. No aneurysm. POSTERIOR CEREBRAL ARTERIES: Persistent fetal origin of the left PCA with hypoplastic left P1 segment. No significant stenosis. No occlusion. No aneurysm. BASILAR ARTERY: No significant stenosis. No occlusion. No aneurysm. SOFT TISSUES: No acute abnormality. No masses or lymphadenopathy. BONES: No acute abnormality. IMPRESSION: 1. No large vessel occlusion. 2. 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 Electronically signed by: Audra Blend MD 02/04/2024 03:42 PM EDT RP Workstation: [id number]   CT HEAD CODE STROKE WO CONTRAST Result Date: 02/04/2024 EXAM: CT HEAD WITHOUT 02/04/2024 03:04:46 PM TECHNIQUE: CT of the head was performed without the administration of intravenous  contrast. Automated exposure control, iterative reconstruction, and/or weight based adjustment of the mA/kV was utilized to reduce the radiation dose to as low as reasonably achievable. COMPARISON: MRI brain 02/15/2009 CLINICAL HISTORY: Neuro deficit, acute, stroke suspected. FINDINGS: BRAIN AND VENTRICLES: No acute intracranial hemorrhage. No mass effect or midline shift. No extra-axial fluid collection. Gray-white differentiation is maintained. No hydrocephalus. [place] stroke program early CT (aspect) score Ganglionic (caudate, ic, lentiform nucleus, insula, M1-m3): 7 Supraganglionic (m4-m6): 3 Total: 10 ORBITS: No acute abnormality. SINUSES  AND MASTOIDS: No acute abnormality. SOFT TISSUES AND SKULL: No acute skull fracture. No acute soft tissue abnormality. IMPRESSION: 1. No acute intracranial abnormality. 2. ASPECT score: 10. Code stroke results were communicated to Dr. Alecia Ames at 3:10 PM on 02/04/2024 by secure text paging. Electronically signed by: Audra Blend MD 02/04/2024 03:11 PM EDT RP Workstation: [id number]    Assessment and Plan: 14F h/o CAD s/p CABG, ICM, HFpEF, HTN, HLD, and breast cancer p/w c/f acute stroke.  RUE weakness Presumed acute CVA -Neuro following; appreciate eval/recs -PT/OT/SLP following; appreciate recs -Cardiac telemetry -Allow permissive HTN -Frequent neuro checks -NPO until passes stroke swallow screen -F/u HgbA1c, fasting lipid panel -F/u MRI brain  ICM HFpEF -PTA ASA 81mg  daily, crestor  40mg  daily, metoprolol  XL 50mg  daily,  -HOLD pta Entresto  49-51mg  BID for now  HTN -HOLD pta amlodipine  for now   Advance Care Planning:   Code Status: Full Code   Consults: Neurology  Family Communication: N/A  Severity of Illness: The appropriate patient status for this patient is INPATIENT. Inpatient status is judged to be reasonable and necessary in order to provide the required intensity of service to ensure the patient's safety. The patient's presenting  symptoms, physical exam findings, and initial radiographic and laboratory data in the context of their chronic comorbidities is felt to place them at high risk for further clinical deterioration. Furthermore, it is not anticipated that the patient will be medically stable for discharge from the hospital within 2 midnights of admission.   * I certify that at the point of admission it is my clinical judgment that the patient will require inpatient hospital care spanning beyond 2 midnights from the point of admission due to high intensity of service, high risk for further deterioration and high frequency of surveillance required.*   ------- I spent 55 minutes reviewing previous labs/notes, obtaining separate history at the bedside, counseling/discussing the treatment plan outlined above, ordering medications/tests, and performing clinical documentation.  Author: Arne Langdon, MD 02/04/2024 4:48 PM  For on call review [email or web address].

## 2024-02-04 NOTE — ED Notes (Signed)
 Patient transported to MRI

## 2024-02-04 NOTE — Consult Note (Signed)
 NEUROLOGY CONSULT NOTE   Date of service: Feb 04, 2024 Patient Name: Alyssa Cardenas MRN:  [id number] DOB:  07-Dec-1940 Chief Complaint: "Code stroke " Requesting Provider: No att. providers found  History of Present Illness  Alyssa Cardenas is a 83 y.o. female with hx of HTN, HLD, CAD s/p CABG, MI,cardiomyopathy, CKD, anxiety and cancer who presents via EMS from home via EMS. She woke at 0900 in her normal state of her health and then laid back down. She woke again after laying back down, with right facial droop, slurred speech, and right side weakness. NIHSS CT head with no acute process. CTA head and neck   LKW: 0900 Modified rankin score: 0-Completely asymptomatic and back to baseline post- stroke IV Thrombolysis:  No outside window  EVT: No LVO   NIHSS components Score: Comment  1a Level of Conscious 0[x]  1[]  2[]  3[]      1b LOC Questions 0[x]  1[]  2[]       1c LOC Commands 0[x]  1[]  2[]       2 Best Gaze 0[x]  1[]  2[]       3 Visual 0[x]  1[]  2[]  3[]      4 Facial Palsy 0[]  1[x]  2[]  3[]      5a Motor Arm - left 0[x]  1[]  2[]  3[]  4[]  UN[]    5b Motor Arm - Right 0[]  1[x]  2[]  3[]  4[]  UN[]    6a Motor Leg - Left 0[x]  1[]  2[]  3[]  4[]  UN[]    6b Motor Leg - Right 0[x]  1[]  2[]  3[]  4[]  UN[]    7 Limb Ataxia 0[]  1[x]  2[]  UN[]      8 Sensory 0[]  1[x]  2[]  UN[]      9 Best Language 0[x]  1[]  2[]  3[]      10 Dysarthria 0[]  1[x]  2[]  UN[]      11 Extinct. and Inattention 0[x]  1[]  2[]       TOTAL: 5      ROS  Comprehensive ROS performed and pertinent positives documented in HPI    Past History   Past Medical History:  Diagnosis Date   Anxiety    Breast cancer (HCC)    CAD (coronary artery disease), native coronary artery    Cath 08/27/1991 Normal Left main, calcified proximal LAD, 50% stenosis proximal LAD, 90% stenosis mid LAD, occluded Diag 1, 95% stenosis proximal Diag 2, 95% stenosis proximal CFX, 95% stenosis proximal OM 1, occluded RCA;  CABG w LIMA to LAD, SVG to dx, SVG to OM1-2-circ, SVG to  PDA 08/29/1991 Dr. Elvan Hamel     Cancer 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 CABG  EF     Hyperlipidemia    Hypertensive heart disease     Insomnia 11/20/2021   Left bundle branch block 08/01/2018   Myocardial infarction (HCC)    Obesity (BMI 30-39.9)    Preop cardiovascular exam 08/01/2018   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   1992   Vitamin D  deficiency 03/10/2015    Past Surgical History:  Procedure Laterality Date   BREAST BIOPSY     BREAST EXCISIONAL BIOPSY Bilateral    BREAST LUMPECTOMY Left    2019 DCIS   BREAST LUMPECTOMY WITH RADIOACTIVE SEED LOCALIZATION Left 08/18/2018   Procedure: LEFT BREAST LUMPECTOMY WITH RADIOACTIVE SEED LOCALIZATION;  Surgeon: Alethea Andes,  Nancyann Aye, MD;  Location: [facility] OR;  Service: General;  Laterality: Left;   CORONARY ARTERY BYPASS GRAFT  1992   EYE SURGERY     cataract removal 2017   TONSILLECTOMY      Family History: Family History  Problem Relation Age of Onset   Heart disease Mother    Hyperlipidemia Mother    Hypertension Mother    Heart disease Father    Hypertension Father    Lung cancer Father    Diabetes Maternal Grandmother    Stroke Maternal Grandfather    Stroke Paternal Grandmother    Heart disease Paternal Grandfather    Heart disease Brother    Hyperlipidemia Brother    Hypertension Brother    Mental illness Brother    Heart disease Brother    Hyperlipidemia Brother    Hypertension Brother    BRCA 1/2 Neg Hx    Breast cancer Neg Hx     Social History  reports that she quit smoking about 48 years ago. Her smoking use included cigarettes. She has never used smokeless tobacco. She reports current alcohol use. She reports that she does not use drugs.  Allergies   Allergen Reactions   Ace Inhibitors Cough and Other (See Comments)   Latex Hives and Other (See Comments)   Tamoxifen  Tinitus and Other (See Comments)    Medications   Current Facility-Administered Medications:    sodium chloride  flush (NS) 0.9 % injection 3 mL, 3 mL, Intravenous, Once, Alyssa Montenegro, MD  Current Outpatient Medications:    amLODipine  (NORVASC ) 10 MG tablet, Take 10 mg by mouth daily. , Disp: , Rfl:    anastrozole  (ARIMIDEX ) 1 MG tablet, TAKE 1 TABLET(1 MG) BY MOUTH DAILY (Patient not taking: Reported on 12/26/2023), Disp: 90 tablet, Rfl: 3   aspirin EC 81 MG tablet, Take 81 mg by mouth as needed., Disp: , Rfl:    Cholecalciferol (VITAMIN D ) 50 MCG (2000 UT) CAPS, Take 1 capsule (2,000 Units total) by mouth daily., Disp: 30 capsule, Rfl:    dimenhyDRINATE (DRAMAMINE) 50 MG tablet, Take 50 mg by mouth daily as needed for dizziness. , Disp: , Rfl:    ezetimibe  (ZETIA ) 10 MG tablet, TAKE 1 TABLET(10 MG) BY MOUTH DAILY (Patient taking differently: Take 10 mg by mouth daily.), Disp: 90 tablet, Rfl: 1   fluticasone (FLONASE) 50 MCG/ACT nasal spray, Place 2 sprays into both nostrils daily as needed for allergies or rhinitis., Disp: , Rfl:    Krill Oil 350 MG CAPS, Take 350 mg by mouth daily., Disp: , Rfl:    loratadine (CLARITIN) 10 MG tablet, Take 10 mg by mouth daily as needed for allergies. , Disp: , Rfl:    metoprolol  succinate (TOPROL -XL) 50 MG 24 hr tablet, TAKE 1 TABLET(50 MG) BY MOUTH DAILY, Disp: 90 tablet, Rfl: 2   nitroGLYCERIN  (NITROSTAT ) 0.4 MG SL tablet, Place 1 tablet (0.4 mg total) under the tongue every 5 (five) minutes as needed for chest pain., Disp: 25 tablet, Rfl: 3   Polyvinyl Alcohol-Povidone PF 1.4-0.6 % SOLN, Place 2 drops into both eyes daily as needed (for dry eyes)., Disp: , Rfl:    potassium chloride  SA (KLOR-CON ) 20 MEQ tablet, Take 20 mEq by mouth daily., Disp: , Rfl:    rosuvastatin  (CRESTOR ) 40 MG tablet, Take 40 mg by mouth every evening. , Disp:  , Rfl:    sacubitril-valsartan (ENTRESTO ) 49-51 MG, TAKE 1 TABLET BY MOUTH TWICE DAILY, Disp: 180 tablet, Rfl: 2  Vitals  Vitals:   02/04/24 1500  Weight: 92.4 kg    Body mass index is 39.78 kg/m.  Physical Exam   Constitutional: Appears well-developed and well-nourished.   Psych: Affect appropriate to situation.   Eyes: No scleral injection.   HENT: No OP obstruction.   Head: Normocephalic.   Cardiovascular: Normal rate and regular rhythm.   Respiratory: Effort normal, non-labored breathing.   GI: Soft.  No distension. There is no tenderness.   Skin: WDI.    Neurologic Examination   Mental Status -  Level of arousal and orientation to time, place, and person were intact . Language including expression, naming, repetition, comprehension was assessed and found intact . Dysarthric Attention span and concentration were normal  . Recent and remote memory were intact . Fund of Knowledge was assessed and was intact .  Cranial Nerves II - XII - II - Visual field intact OU . III, IV, VI - Extraocular movements intact . V - Facial sensation intact bilaterally . VII - right facial droop  VIII - Hearing & vestibular intact bilaterally . X - Palate elevates symmetrically . XI - Chin turning & shoulder shrug intact bilaterally . XII - Tongue protrusion intact .  Motor Strength - The patient's strength was normal left arm and bilateral lowers. Right arm 4/5 with drift   Bulk was normal and fasciculations were absent.   Motor Tone - Muscle tone was assessed at the neck and appendages and was normal. Sensory - decreased on right side  Coordination - ataxia on left FTN out of proportion to her weakness  Gait and Station - deferred.  Labs/Imaging/Neurodiagnostic studies   CBC: No results for input(s): "WBC", "NEUTROABS", "HGB", "HCT", "MCV", "PLT" in the last 168 hours. Basic Metabolic Panel:  Lab Results  Component Value Date   NA 140 09/15/2022   K 4.3 09/15/2022   CO2 21  09/15/2022   GLUCOSE 106 (H) 09/15/2022   BUN 19 09/15/2022   CREATININE 1.21 (H) 09/15/2022   CALCIUM 9.8 09/15/2022   GFRNONAA 49 (L) 08/14/2018   GFRAA 57 (L) 08/14/2018   Lipid Panel: No results found for: "LDLCALC" HgbA1c: No results found for: "HGBA1C" Urine Drug Screen: No results found for: "LABOPIA", "COCAINSCRNUR", "LABBENZ", "AMPHETMU", "THCU", "LABBARB"  Alcohol Level No results found for: "ETH" INR No results found for: "INR" APTT No results found for: "APTT" AED levels: No results found for: "PHENYTOIN", "ZONISAMIDE", "LAMOTRIGINE", "LEVETIRACETA"  CT Head without contrast(Personally reviewed): No acute process  CT angio Head and Neck with contrast(Personally reviewed):  No LVO. Official read pending    ASSESSMENT   Alyssa Cardenas is a 83 y.o. female  hx of HTN, HLD, CAD s/p CABG, MI,cardiomyopathy, CKD, anxiety and cancer who presents via EMS from home via EMS. She woke at 0900 in her normal state of her health and then laid back down. She woke again after laying back down, with right facial droop, slurred speech, and right side weakness  RECOMMENDATIONS  - admit to medicine for stroke workup  - HgbA1c, fasting lipid panel - MRI of the brain without contrast - Frequent neuro checks - Echocardiogram - Prophylactic therapy-Antiplatelet med: Aspirin - dose 81mg  and plavix 75mg  daily for 21 days than plavix alone  - Allow for permissive hypertension than began to normalize  - bedside swallow screen  - Risk factor modification - Telemetry monitoring - PT consult, OT consult, Speech consult - Stroke team to follow ______________________________________________________________________   Signed, Laymond Priestly, NP [facility]  I have seen the patient reviewed the above note.  She has right arm and right face weakness with ataxia out of proportion to weakness.  My suspicion is that this is going to represent a small subcortical stroke.  She has a  history of GI upset with aspirin, so after the 3 weeks of dual antiplatelet therapy, I would consider Plavix monotherapy as opposed to aspirin.  Etiology could be embolic from cardiomyopathy, small vessel disease, MRI will likely be very helpful to help further differentiate.  Stroke team will follow.  Ann Keto, MD [facility]   If 7pm- 7am, please page neurology on call as listed in AMION.

## 2024-02-04 NOTE — ED Provider Notes (Signed)
 [place] EMERGENCY DEPARTMENT AT [facility] Provider Note   CSN: [id number] Arrival date & time: 02/04/24  1459     History  Chief Complaint  Patient presents with   Code Stroke    Alyssa Cardenas is a 83 y.o. female.  HPI 83 year old female presents as a code stroke.  Initial history is from EMS.  Woke up at 9 AM and was feeling fine.  Went back to sleep and woke up around 1:30 PM with weakness on the right side.  Has a right-sided facial droop and slurred speech.  Moderately hypertensive at around 150 for EMS.  Code stroke activated.  Home Medications Prior to Admission medications   Medication Sig Start Date End Date Taking? Authorizing Provider  amLODipine  (NORVASC ) 10 MG tablet Take 10 mg by mouth daily.     [provider]  anastrozole  (ARIMIDEX ) 1 MG tablet TAKE 1 TABLET(1 MG) BY MOUTH DAILY Patient not taking: Reported on 12/26/2023 02/11/23   Gudena, Vinay, MD  aspirin EC 81 MG tablet Take 81 mg by mouth as needed.    [provider]  Cholecalciferol (VITAMIN D ) 50 MCG (2000 UT) CAPS Take 1 capsule (2,000 Units total) by mouth daily. 03/05/21   Gudena, Vinay, MD  dimenhyDRINATE (DRAMAMINE) 50 MG tablet Take 50 mg by mouth daily as needed for dizziness.     [provider]  ezetimibe  (ZETIA ) 10 MG tablet TAKE 1 TABLET(10 MG) BY MOUTH DAILY Patient taking differently: Take 10 mg by mouth daily. 04/03/20   Krasowski, Robert J, MD  fluticasone (FLONASE) 50 MCG/ACT nasal spray Place 2 sprays into both nostrils daily as needed for allergies or rhinitis.    [provider]  Oksana Bergamo Oil 350 MG CAPS Take 350 mg by mouth daily.    [provider]  loratadine (CLARITIN) 10 MG tablet Take 10 mg by mouth daily as needed for allergies.     [provider]  metoprolol  succinate (TOPROL -XL) 50 MG 24 hr tablet TAKE 1 TABLET(50 MG) BY MOUTH DAILY 11/10/23   Avanell Leigh, MD  nitroGLYCERIN  (NITROSTAT ) 0.4 MG SL tablet Place 1  tablet (0.4 mg total) under the tongue every 5 (five) minutes as needed for chest pain. 12/26/23   Avanell Leigh, MD  Polyvinyl Alcohol-Povidone PF 1.4-0.6 % SOLN Place 2 drops into both eyes daily as needed (for dry eyes).    [provider]  potassium chloride  SA (KLOR-CON ) 20 MEQ tablet Take 20 mEq by mouth daily. 12/26/20   [provider]  rosuvastatin  (CRESTOR ) 40 MG tablet Take 40 mg by mouth every evening.     [provider]  sacubitril-valsartan (ENTRESTO ) 49-51 MG TAKE 1 TABLET BY MOUTH TWICE DAILY 09/14/23   Avanell Leigh, MD      Allergies    Ace inhibitors, Latex, and Tamoxifen     Review of Systems   Review of Systems  Cardiovascular:  Negative for chest pain.  Neurological:  Positive for speech difficulty and weakness. Negative for headaches.    Physical Exam Updated Vital Signs BP 134/77   Pulse 61   Temp 97.6 F (36.4 C) (Oral)   Resp 10   Ht 5' (1.524 m)   Wt 90.7 kg   SpO2 100%   BMI 39.06 kg/m  Physical Exam Vitals and nursing note reviewed.  Constitutional:      Appearance: She is well-developed.  HENT:     Head: Normocephalic and atraumatic.  Eyes:  Extraocular Movements: Extraocular movements intact.     Pupils: Pupils are equal, round, and reactive to light.  Cardiovascular:     Rate and Rhythm: Normal rate and regular rhythm.     Heart sounds: Normal heart sounds.  Pulmonary:     Effort: Pulmonary effort is normal.     Breath sounds: Normal breath sounds.  Abdominal:     Palpations: Abdomen is soft.     Tenderness: There is no abdominal tenderness.  Skin:    General: Skin is warm and dry.  Neurological:     Mental Status: She is alert.     Comments: Difficult to understand speech, mild right facial droop.  Mild right upper extremity weakness though her legs seem to be symmetric and intact.     ED Results / Procedures / Treatments   Labs (all labs ordered are listed, but only abnormal results are  displayed) Labs Reviewed  COMPREHENSIVE METABOLIC PANEL WITH GFR - Abnormal; Notable for the following components:      Result Value   CO2 21 (*)    Glucose, Bld 116 (*)    Creatinine, Ser 1.17 (*)    GFR, Estimated 47 (*)    All other components within normal limits  HEMOGLOBIN A1C - Abnormal; Notable for the following components:   Hgb A1c MFr Bld 5.7 (*)    All other components within normal limits  I-STAT CHEM 8, ED - Abnormal; Notable for the following components:   Creatinine, Ser 1.20 (*)    Glucose, Bld 115 (*)    All other components within normal limits  CBG MONITORING, ED - Abnormal; Notable for the following components:   Glucose-Capillary 107 (*)    All other components within normal limits  PROTIME-INR  APTT  CBC  DIFFERENTIAL  ETHANOL    EKG EKG Interpretation Date/Time:  Saturday Feb 04 2024 15:29:42 EDT Ventricular Rate:  65 PR Interval:  151 QRS Duration:  224 QT Interval:  505 QTC Calculation: 526 R Axis:   -7  Text Interpretation: Sinus rhythm IVCD, consider atypical LBBB Confirmed by Jerilynn Montenegro [phone]) on 02/04/2024 3:39:37 PM  Radiology CT ANGIO HEAD NECK W WO CM (CODE STROKE) Result Date: 02/04/2024 EXAM: CTA Head and Neck with Intravenous Contrast. CLINICAL HISTORY: Neuro deficit, acute, stroke suspected. TECHNIQUE: Axial CTA images of the head and neck performed with intravenous contrast. Two-dimensional MIP and/or three-dimensional MIP and volume rendered reformations were performed. Note: Per PQRS, the description of internal carotid artery percent stenosis, including 0 percent or normal exam, is based on [place] American Symptomatic Carotid Endarterectomy Trial (NASCET) criteria. Dose reduction technique was used including one or more of the following: automated exposure control, adjustment of mA and kV according to patient size, and/or iterative reconstruction. CONTRAST: With; 75mL (iohexol (OMNIPAQUE) 350 MG/ML injection 75 mL IOHEXOL 350 MG/ML  SOLN) COMPARISON: Head CT 02/03/2020. FINDINGS: CTA NECK: COMMON CAROTID ARTERIES: Calcified plaque along the bilateral carotid bulbs and hemodynamically significant stenosis. 2-vessel arch configuration with common origin of the right brachiocephalic artery and left common carotid artery. INTERNAL CAROTID ARTERIES: Atherosclerotic calcifications of the carotid siphons without significant stenosis or aneurysm. VERTEBRAL ARTERIES: Calcified plaque at the bilateral vertebral artery origins results in moderate stenosis bilaterally. Mild calcified plaque along the right V3/V4 junction and proximal left V4 segment. CTA HEAD: No large vessel occlusion. ANTERIOR CEREBRAL ARTERIES: No significant stenosis. No occlusion. No aneurysm. MIDDLE CEREBRAL ARTERIES: No significant stenosis. No occlusion. No aneurysm. POSTERIOR CEREBRAL ARTERIES: Persistent fetal origin of  the left PCA with hypoplastic left P1 segment. No significant stenosis. No occlusion. No aneurysm. BASILAR ARTERY: No significant stenosis. No occlusion. No aneurysm. SOFT TISSUES: No acute abnormality. No masses or lymphadenopathy. BONES: No acute abnormality. IMPRESSION: 1. No large vessel occlusion. 2. 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 Electronically signed by: Audra Blend MD 02/04/2024 03:42 PM EDT RP Workstation: [id number]   CT HEAD CODE STROKE WO CONTRAST Result Date: 02/04/2024 EXAM: CT HEAD WITHOUT 02/04/2024 03:04:46 PM TECHNIQUE: CT of the head was performed without the administration of intravenous contrast. Automated exposure control, iterative reconstruction, and/or weight based adjustment of the mA/kV was utilized to reduce the radiation dose to as low as reasonably achievable. COMPARISON: MRI brain 02/15/2009 CLINICAL HISTORY: Neuro deficit, acute, stroke suspected. FINDINGS: BRAIN AND VENTRICLES: No acute intracranial hemorrhage. No mass effect or midline shift. No extra-axial fluid  collection. Gray-white differentiation is maintained. No hydrocephalus. [place] stroke program early CT (aspect) score Ganglionic (caudate, ic, lentiform nucleus, insula, M1-m3): 7 Supraganglionic (m4-m6): 3 Total: 10 ORBITS: No acute abnormality. SINUSES AND MASTOIDS: No acute abnormality. SOFT TISSUES AND SKULL: No acute skull fracture. No acute soft tissue abnormality. IMPRESSION: 1. No acute intracranial abnormality. 2. ASPECT score: 10. Code stroke results were communicated to Dr. Alecia Ames at 3:10 PM on 02/04/2024 by secure text paging. Electronically signed by: Audra Blend MD 02/04/2024 03:11 PM EDT RP Workstation: [id number]    Procedures Procedures    Medications Ordered in ED Medications  sodium chloride  flush (NS) 0.9 % injection 3 mL (3 mLs Intravenous Not Given 02/04/24 1521)   stroke: early stages of recovery book (has no administration in time range)  aspirin chewable tablet 81 mg (81 mg Oral Given 02/04/24 1546)  clopidogrel (PLAVIX) tablet 75 mg (75 mg Oral Given 02/04/24 1547)  amLODipine  (NORVASC ) tablet 10 mg (has no administration in time range)  Vitamin D  CAPS 2,000 Units (has no administration in time range)  ezetimibe  (ZETIA ) tablet 10 mg (has no administration in time range)  metoprolol  succinate (TOPROL -XL) 24 hr tablet 50 mg (has no administration in time range)  rosuvastatin  (CRESTOR ) tablet 40 mg (has no administration in time range)  sacubitril-valsartan (ENTRESTO ) 49-51 mg per tablet (has no administration in time range)  iohexol (OMNIPAQUE) 350 MG/ML injection 75 mL (75 mLs Intravenous Contrast Given 02/04/24 1527)    ED Course/ Medical Decision Making/ A&P                                 Medical Decision Making Amount and/or Complexity of Data Reviewed Independent Historian: EMS Labs: ordered.    Details: Mild hyperglycemia.  Normal WBC. Radiology: ordered and independent interpretation performed.    Details: No head bleed ECG/medicine tests: ordered  and independent interpretation performed.    Details: LBBB  Risk Decision regarding hospitalization.   Patient presents with stroke symptoms, is outside of the thrombolytic window.  No large vessel occlusion on CTA.  No signs or symptoms concerning for airway compromise.  Dr. Alecia Ames is recommending hospitalist admission for stroke workup and care.  Discussed with Dr. Sulema Endo, who will admit.        Final Clinical Impression(s) / ED Diagnoses Final diagnoses:  Acute ischemic stroke [facility])    Rx / DC Orders ED Discharge Orders     None         Jerilynn Montenegro, MD 02/04/24 2040

## 2024-02-04 NOTE — Progress Notes (Signed)
 Patient arrived to unit AAOx4. No c/o pain or SOB. Skin intact with MASD to Buttock/groin. Patients son and husband at bedside. Orientation to unit provided and bed alarm on.

## 2024-02-05 DIAGNOSIS — E785 Hyperlipidemia, unspecified: Secondary | ICD-10-CM

## 2024-02-05 DIAGNOSIS — I639 Cerebral infarction, unspecified: Secondary | ICD-10-CM | POA: Diagnosis not present

## 2024-02-05 DIAGNOSIS — I69391 Dysphagia following cerebral infarction: Secondary | ICD-10-CM | POA: Diagnosis not present

## 2024-02-05 DIAGNOSIS — N1831 Chronic kidney disease, stage 3a: Secondary | ICD-10-CM

## 2024-02-05 DIAGNOSIS — I739 Peripheral vascular disease, unspecified: Secondary | ICD-10-CM | POA: Diagnosis not present

## 2024-02-05 DIAGNOSIS — I2510 Atherosclerotic heart disease of native coronary artery without angina pectoris: Secondary | ICD-10-CM

## 2024-02-05 DIAGNOSIS — I429 Cardiomyopathy, unspecified: Secondary | ICD-10-CM | POA: Diagnosis not present

## 2024-02-05 LAB — BASIC METABOLIC PANEL WITH GFR
Anion gap: 11 (ref 5–15)
BUN: 12 mg/dL (ref 8–23)
CO2: 21 mmol/L — ABNORMAL LOW (ref 22–32)
Calcium: 9.2 mg/dL (ref 8.9–10.3)
Chloride: 105 mmol/L (ref 98–111)
Creatinine, Ser: 0.97 mg/dL (ref 0.44–1.00)
GFR, Estimated: 59 mL/min — ABNORMAL LOW (ref >60)
Glucose, Bld: 96 mg/dL (ref 70–99)
Potassium: 3.4 mmol/L — ABNORMAL LOW (ref 3.5–5.1)
Sodium: 137 mmol/L (ref 135–145)

## 2024-02-05 LAB — CBC
HCT: 41.6 % (ref 36.0–46.0)
Hemoglobin: 13.1 g/dL (ref 12.0–15.0)
MCH: 27.3 pg (ref 26.0–34.0)
MCHC: 31.5 g/dL (ref 30.0–36.0)
MCV: 86.8 fL (ref 80.0–100.0)
Platelets: 213 10*3/uL (ref 150–400)
RBC: 4.79 MIL/uL (ref 3.87–5.11)
RDW: 15 % (ref 11.5–15.5)
WBC: 5.4 10*3/uL (ref 4.0–10.5)
nRBC: 0 % (ref 0.0–0.2)

## 2024-02-05 LAB — LIPID PANEL
Cholesterol: 141 mg/dL (ref 0–200)
HDL: 70 mg/dL (ref >40)
LDL Cholesterol: 64 mg/dL (ref 0–99)
Total CHOL/HDL Ratio: 2 ratio
Triglycerides: 37 mg/dL (ref <150)
VLDL: 7 mg/dL (ref 0–40)

## 2024-02-05 MED ORDER — METOPROLOL SUCCINATE ER 25 MG PO TB24
12.5000 mg | ORAL_TABLET | Freq: Every day | ORAL | Status: DC
  Administered 2024-02-05 – 2024-02-06 (×2): 12.5 mg via ORAL
  Filled 2024-02-05 (×2): qty 1

## 2024-02-05 MED ORDER — POTASSIUM CHLORIDE CRYS ER 20 MEQ PO TBCR
40.0000 meq | EXTENDED_RELEASE_TABLET | Freq: Once | ORAL | Status: AC
  Administered 2024-02-05: 40 meq via ORAL
  Filled 2024-02-05: qty 2

## 2024-02-05 NOTE — Evaluation (Signed)
 Clinical/Bedside Swallow Evaluation Patient Details  Name: Alyssa Cardenas MRN: [id number] Date of Birth: 12/12/1940  Today's Date: 02/05/2024 Time: SLP Start Time (ACUTE ONLY): 1237 SLP Stop Time (ACUTE ONLY): 1247 SLP Time Calculation (min) (ACUTE ONLY): 10 min  Past Medical History:  Past Medical History:  Diagnosis Date   Anxiety    Breast cancer (HCC)    CAD (coronary artery disease), native coronary artery    Cath 08/27/1991 Normal Left main, calcified proximal LAD, 50% stenosis proximal LAD, 90% stenosis mid LAD, occluded Diag 1, 95% stenosis proximal Diag 2, 95% stenosis proximal CFX, 95% stenosis proximal OM 1, occluded RCA;  CABG w LIMA to LAD, SVG to dx, SVG to OM1-2-circ, SVG to PDA 08/29/1991 Dr. 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  EF     Hyperlipidemia    Hypertensive heart disease     Insomnia 11/20/2021   Left bundle branch block 08/01/2018   Myocardial infarction (HCC)    Obesity (BMI 30-39.9)    Preop cardiovascular exam 08/01/2018   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   1992   Vitamin D  deficiency 03/10/2015   Past Surgical History:  Past Surgical History:  Procedure Laterality Date   BREAST BIOPSY     BREAST EXCISIONAL BIOPSY Bilateral    BREAST LUMPECTOMY Left    2019 DCIS   BREAST LUMPECTOMY WITH RADIOACTIVE SEED LOCALIZATION Left 08/18/2018   Procedure: LEFT BREAST LUMPECTOMY WITH RADIOACTIVE SEED LOCALIZATION;  Surgeon: Caralyn Chandler, MD;  Location: [facility] OR;  Service: General;  Laterality: Left;   CORONARY ARTERY BYPASS GRAFT  1992   EYE SURGERY     cataract removal 2017   TONSILLECTOMY     HPI:  Alyssa Cardenas is a 83 y.o. female with medical history significant of CAD  s/p CABG, ICM, HFpEF, HTN, HLD, and breast cancer p/w c/f acute stroke.  -Patient presents to ED secondary to complaint of numbness, right upper extremity weakness, right facial droop and slurred speech, MRI brain significant for acute CVA. 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    Assessment / Plan / Recommendation  Clinical Impression  Patient presents with a mild oral dysphagia characterized by right sided CN VII and V dysfunction resulting in right sided facial weakness and mild sensory loss requiring mastication primarily on the left with occassional biting on right lateral tongue and buccal cavity. Patient independently compensating with complete mastication and oral clearance and no overt indication of aspiration across consistencies. Regular diet recommended. Patient instructed to consider choosing softer items to start given mild oral deficits. SLP Visit Diagnosis: Dysphagia, oral phase (R13.11)       Diet Recommendation Regular;Thin liquid    Liquid Administration via: Cup;Straw Medication Administration: Whole meds with liquid Supervision: Patient able to self feed Compensations: Slow rate;Small sips/bites Postural Changes: Seated upright at 90 degrees    Other  Recommendations Oral Care Recommendations: Oral care BID    Recommendations for follow up therapy are one component of a multi-disciplinary discharge planning process, led by the attending physician.  Recommendations may be updated based on patient status, additional functional  criteria and insurance authorization.  Follow up Recommendations No SLP follow up         Functional Status Assessment Patient has not had a recent decline in their functional status    Swallow Study   General HPI: Alyssa Cardenas is a 83 y.o. female with medical history significant of CAD s/p CABG, ICM, HFpEF, HTN, HLD, and breast cancer p/w c/f acute stroke.  -Patient  presents to ED secondary to complaint of numbness, right upper extremity weakness, right facial droop and slurred speech, MRI brain significant for acute CVA. 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 Type of Study: Bedside Swallow Evaluation Previous Swallow Assessment: none Diet Prior to this Study: NPO Temperature Spikes Noted: No Respiratory Status: Room air History of Recent Intubation: No Behavior/Cognition: Alert;Cooperative;Pleasant mood Oral Cavity Assessment: Within Functional Limits Oral Care Completed by SLP: No Oral Cavity - Dentition: Adequate natural dentition Vision: Functional for self-feeding Self-Feeding Abilities: Able to feed self Patient Positioning: Upright in chair Baseline Vocal Quality: Normal Volitional Cough: Strong Volitional Swallow: Able to elicit    Oral/Motor/Sensory Function Overall Oral Motor/Sensory Function: Mild impairment Facial ROM: Reduced right;Suspected CN VII (facial) dysfunction Facial Symmetry: Abnormal symmetry right;Suspected CN VII (facial) dysfunction Facial Strength: Reduced right;Suspected CN VII (facial) dysfunction Facial Sensation: Reduced right;Suspected CN V (Trigeminal) dysfunction Lingual ROM: Within Functional Limits Lingual Symmetry: Within Functional Limits Lingual Strength: Within Functional Limits Lingual Sensation: Within Functional Limits Velum: Within Functional Limits Mandible: Within Functional Limits   Ice Chips Ice chips: Not tested   Thin Liquid Thin Liquid: Within functional limits Presentation: Cup;Self Fed;Straw    Nectar Thick Nectar Thick Liquid: Not tested   Honey Thick Honey Thick Liquid: Not tested   Puree Puree: Within functional limits Presentation: Spoon;Self Fed   Solid     Solid: Within functional limits Presentation: Self Fed     [employer] MA, CCC-SLP  Kaizen Ibsen Meryl 02/05/2024,1:04 PM

## 2024-02-05 NOTE — Evaluation (Signed)
 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   Flatulence, eructation and gas pain 11/24/2021   Rectal bleeding 11/24/2021   Chronic systolic heart failure (HCC) 11/20/2021   Insomnia 11/20/2021   Myocardial infarction [facility])    Chronic kidney disease    Cancer (HCC)    Breast cancer (HCC)    Anxiety    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   Cardiomyopathy (HCC) 08/01/2018   Left bundle branch block 08/01/2018   Preop cardiovascular exam 08/01/2018   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   Chronic kidney disease, stage 3b (HCC) 03/25/2015   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   CAD (coronary artery disease), native coronary artery    Obesity (BMI 30-39.9)    History of MI (myocardial infarction) 03/10/2015   History of colonic polyps 03/10/2015   Vitamin D  deficiency 03/10/2015   Hypertensive heart disease     Hyperlipidemia    Dermatitis 08/14/2013   Essential hypertension 05/02/2012   Past Medical History:  Past Medical History:  Diagnosis Date   Anxiety    Breast cancer (HCC)    CAD (coronary artery disease), native coronary artery    Cath 08/27/1991 Normal Left main, calcified proximal LAD, 50% stenosis proximal LAD, 90% stenosis mid LAD, occluded Diag 1, 95% stenosis proximal Diag 2, 95% stenosis proximal CFX, 95% stenosis proximal OM 1, occluded RCA;  CABG w LIMA to LAD, SVG to dx, SVG to OM1-2-circ, SVG to PDA 08/29/1991 Dr. 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  EF     Hyperlipidemia    Hypertensive heart disease     Insomnia 11/20/2021   Left bundle branch block 08/01/2018   Myocardial infarction (HCC)    Obesity (BMI 30-39.9)    Preop cardiovascular exam 08/01/2018   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   1992   Vitamin D  deficiency 03/10/2015   Past Surgical History:  Past Surgical History:  Procedure Laterality Date   BREAST BIOPSY     BREAST EXCISIONAL BIOPSY Bilateral    BREAST LUMPECTOMY Left    2019 DCIS   BREAST LUMPECTOMY WITH RADIOACTIVE SEED LOCALIZATION Left 08/18/2018   Procedure: LEFT BREAST LUMPECTOMY WITH RADIOACTIVE SEED LOCALIZATION;  Surgeon: Caralyn Chandler, MD;  Location: [facility] OR;  Service: General;  Laterality: Left;   CORONARY ARTERY BYPASS GRAFT  1992   EYE SURGERY     cataract removal 2017   TONSILLECTOMY     HPI:  Alyssa Cardenas is a 83 y.o. female with medical history significant of CAD s/p CABG, ICM, HFpEF, HTN, HLD, and breast cancer p/w c/f acute stroke.  -Patient presents to ED secondary to complaint of numbness, right upper extremity weakness, right facial droop and slurred speech, MRI brain significant for acute CVA. 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  Assessment / Plan / Recommendation Clinical Impression  Patient presents with a mild dysarthria secondary to  right sided CN VII and V dysfunction resulting in right sided facial weakness and mild sensory loss.  Dysarthria is subtle however, reported to be slightly different from baseline but 100% intelligible. Note that during spontaneous laughing, face is symmetric.  No other cognitive-linguistic deficits noted. No acute care f/u indicated however patient will benefit from OP f/u to improve facial deficits and restore intelligiblity to baseline.    SLP  Assessment  SLP Recommendation/Assessment: All further Speech Lanaguage Pathology  needs can be addressed in the next venue of care SLP Visit Diagnosis: Dysphagia, oral phase (R13.11);Dysarthria and anarthria (R47.1)    Recommendations for follow up therapy are one component of a multi-disciplinary discharge planning process, led by the attending physician.  Recommendations may be updated based on patient status, additional functional criteria and insurance authorization.    Follow Up Recommendations  Outpatient SLP       Functional Status Assessment Patient has not had a recent decline in their functional status        SLP Evaluation Cognition  Overall Cognitive Status: Within Functional Limits for tasks assessed       Comprehension  Auditory Comprehension Overall Auditory Comprehension: Appears within functional limits for tasks assessed Visual Recognition/Discrimination Discrimination: Within Function Limits Reading Comprehension Reading Status: Within funtional limits    Expression Expression Primary Mode of Expression: Verbal Verbal Expression Overall Verbal Expression: Appears within functional limits for tasks assessed Written Expression Dominant Hand: Right Written Expression: Within Functional Limits   Oral / Motor  Oral Motor/Sensory Function Overall Oral Motor/Sensory Function: Mild impairment Facial ROM: Reduced right;Suspected CN VII (facial) dysfunction Facial Symmetry: Abnormal symmetry right;Suspected CN VII (facial) dysfunction Facial Strength: Reduced right;Suspected CN VII (facial) dysfunction Facial Sensation: Reduced right;Suspected CN V (Trigeminal) dysfunction Lingual ROM: Within Functional Limits Lingual Symmetry: Within Functional Limits Lingual Strength: Within Functional Limits Lingual Sensation: Within Functional Limits Velum: Within Functional Limits Mandible: Within Functional Limits Motor Speech Articulation:  (see impression  statement) Intelligibility: Intelligible           Markise Haymer MA, CCC-SLP  Tryton Bodi Meryl 02/05/2024, 1:08 PM

## 2024-02-05 NOTE — Evaluation (Signed)
 Physical Therapy Evaluation Patient Details Name: Alyssa Cardenas MRN: [id number] DOB: 12/23/1940 Today's Date: 02/05/2024  History of Present Illness  Pt is a 83 y/o female presenting on 5/31 with R facial droop, slurred speech and R sided weakness. CT negative. 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 PMH includes: HTN, CAD s/p CABG, MI, cardiomyopathy, CKD, anxiety, breast cancer.  Clinical Impression  Pt is presenting slightly below baseline level of functioning. Currently pt is Mod I for bed mobility, sit to stand and gait without an AD. Pt has spouse who is present and supportive. Due to pt current functional status, home set up and available assistance at home recommending skilled physical therapy services 3x/week in order to address strength, balance and functional mobility to decrease risk for falls, injury and re-hospitalization.           If plan is discharge home, recommend the following: Help with stairs or ramp for entrance;Assistance with cooking/housework;Assist for transportation     Equipment Recommendations None recommended by PT     Functional Status Assessment Patient has had a recent decline in their functional status and demonstrates the ability to make significant improvements in function in a reasonable and predictable amount of time.     Precautions / Restrictions Precautions Precautions: Fall Restrictions Weight Bearing Restrictions Per Provider Order: No      Mobility  Bed Mobility Overal bed mobility: Modified Independent             General bed mobility comments: HOB slightly elevated but no assist required, exiting towards R side    Transfers Overall transfer level: Modified independent Equipment used: None Transfers: Sit to/from Stand Sit to Stand: Modified independent (Device/Increase time)           General transfer comment: no physical assist or loss of balance  noted    Ambulation/Gait Ambulation/Gait assistance: Modified independent (Device/Increase time) Gait Distance (Feet): 300 Feet Assistive device: None Gait Pattern/deviations: Step-through pattern   Gait velocity interpretation: 1.31 - 2.62 ft/sec, indicative of limited community ambulator   General Gait Details: slight increase in frontal plane movement, slightly harder landing on the R than LLE. No significant deficits  Stairs Stairs:  (demonstrates good strength with sit to stand and gait to perform stairs per home setup)             Balance Overall balance assessment: Mild deficits observed, not formally tested           Pertinent Vitals/Pain Pain Assessment Pain Assessment: No/denies pain    Home Living Family/patient expects to be discharged to:: Private residence Living Arrangements: Spouse/significant other Available Help at Discharge: Family;Available 24 hours/day Type of Home: House Home Access: Stairs to enter Entrance Stairs-Rails: Can reach both Entrance Stairs-Number of Steps: 1   Home Layout: One level Home Equipment: Shower seat - built in;Grab bars - tub/shower      Prior Function Prior Level of Function : Independent/Modified Independent;Driving               ADLs Comments: reports driving, managing ADLs, IADLs     Extremity/Trunk Assessment   Upper Extremity Assessment Upper Extremity Assessment: Defer to OT evaluation    Lower Extremity Assessment Lower Extremity Assessment: Overall WFL for tasks assessed    Cervical / Trunk Assessment Cervical / Trunk Assessment: Normal  Communication   Communication Communication: Impaired Factors Affecting Communication: Reduced clarity of speech (mildly slurred)  Cognition Arousal: Alert Behavior During Therapy: WFL for tasks assessed/performed   PT - Cognitive impairments: No apparent impairments   Following commands: Intact       Cueing Cueing Techniques: Verbal cues      General Comments General comments (skin integrity, edema, etc.): Difficulty to get good reading of O2 sats during activity 98% immediately upon sitting. HR up to 107 bpm during gait pt slightly short of breathe        Assessment/Plan    PT Assessment All further PT needs can be met in the next venue of care  PT Problem List Decreased balance;Decreased activity tolerance       PT Treatment Interventions      PT Goals (Current goals can be found in the Care Plan section)  Acute Rehab PT Goals Patient Stated Goal: improve mobility and get back to baseline PT Goal Formulation: With patient Time For Goal Achievement: 02/19/24 Potential to Achieve Goals: Good            AM-PAC PT "6 Clicks" Mobility  Outcome Measure Help needed turning from your back to your side while in a flat bed without using bedrails?: None Help needed moving from lying on your back to sitting on the side of a flat bed without using bedrails?: None Help needed moving to and from a bed to a chair (including a wheelchair)?: None Help needed standing up from a chair using your arms (e.g., wheelchair or bedside chair)?: None Help needed to walk in hospital room?: None Help needed climbing 3-5 steps with a railing? : A Little 6 Click Score: 23    End of Session Equipment Utilized During Treatment: Gait belt Activity Tolerance: Patient tolerated treatment well Patient left: in bed;with call bell/phone within reach;with family/visitor present Nurse Communication: Mobility status PT Visit Diagnosis: Unsteadiness on feet (R26.81);Other abnormalities of gait and mobility (R26.89)    Time: 1610-9604 PT Time Calculation (min) (ACUTE ONLY): 16 min   Charges:   PT Evaluation $PT Eval Low Complexity: 1 Low   PT General Charges $$ ACUTE PT VISIT: 1 Visit        Sloan Duncans, DPT, CLT  Acute Rehabilitation Services Office: [phone] (Secure chat preferred)   Jenice Mitts 02/05/2024, 2:51 PM

## 2024-02-05 NOTE — Progress Notes (Signed)
 PROGRESS NOTE    Alyssa Cardenas  ZOX:[id number] DOB: [birth year] DOA: 02/04/2024 PCP: Barnetta Liberty, MD    Chief Complaint  Patient presents with   Code Stroke    Brief Narrative:   Alyssa Cardenas is a 83 y.o. female with medical history significant of CAD s/p CABG, ICM, HFpEF, HTN, HLD, and breast cancer p/w c/f acute stroke. -Patient presents to ED secondary to complaint of numbness, right upper extremity weakness, right facial droop and slurred speech, MRI brain significant for acute CVA, admitted for further workup     Assessment & Plan:   Principal Problem:   CVA (cerebral vascular accident) (HCC)   Acute CVA - MRI brain significant for acute infarct of left central stenosis. - CTA head and neck with no large vessel occlusion - PT/OT/SLP consulted - 2D echo is pending -LDL is 64 -A1c is 5.7 - She was on aspirin at home, currently on aspirin and Plavix - Further management per neurology   CAD status post CABG Ischemic cardiomyopathy Chronic systolic CHF -Known history of ischemic cardiomyopathy, most recent EF 40 to 45%. - GDMT therapy, including Entresto  and Toprol -XL, Entresto , resume Toprol -XL at a lower dose 50> 12.5 mg, to allow for permissive hypertension -Continue aspirin, statin  left bundle branch block - Chronic  CKD stage III -At baseline  Hypertension -Hold amlodipine  and Entresto  to allow for permissive hypertension, Toprol -XL resumed at a lower dose      DVT prophylaxis: lovenox Code Status: full Family Communication: none at bedside Disposition:   Status is: Inpatient    Consultants:  neurology   Subjective:  Reports her weakness and speech feels to be improving  Objective: Vitals:   02/05/24 0300 02/05/24 0400 02/05/24 0401 02/05/24 0858  BP:  126/69  (!) 144/74  Pulse: 67 63 67   Resp: 15 19 12    Temp:   98.9 F (37.2 C)   TempSrc:   Oral   SpO2: 96% 93% 93%   Weight:      Height:        Intake/Output Summary  (Last 24 hours) at 02/05/2024 1058 Last data filed at 02/04/2024 2321 Gross per 24 hour  Intake 0 ml  Output 460 ml  Net -460 ml   Filed Weights   02/04/24 1500 02/04/24 1520  Weight: 92.4 kg 90.7 kg    Examination:  Awake Alert, Oriented X 3, has slurred speech and mild left facial droop Symmetrical Chest wall movement, Good air movement bilaterally, CTAB RRR,No Gallops,Rubs or new Murmurs, No Parasternal Heave +ve B.Sounds, Abd Soft, No tenderness, No rebound - guarding or rigidity. No Cyanosis, Clubbing or edema, No new Rash or bruise      Data Reviewed: I have personally reviewed following labs and imaging studies  CBC: Recent Labs  Lab 02/04/24 1500 02/04/24 1505 02/05/24 0844  WBC 4.8  --  5.4  NEUTROABS 2.4  --   --   HGB 12.7 13.3 13.1  HCT 40.5 39.0 41.6  MCV 87.9  --  86.8  PLT 193  --  213    Basic Metabolic Panel: Recent Labs  Lab 02/04/24 1500 02/04/24 1505 02/05/24 0844  NA 138 140 137  K 3.5 3.6 3.4*  CL 106 107 105  CO2 21*  --  21*  GLUCOSE 116* 115* 96  BUN 16 17 12   CREATININE 1.17* 1.20* 0.97  CALCIUM 9.1  --  9.2    GFR: Estimated Creatinine Clearance: 46.4 mL/min (by C-G formula based on  SCr of 0.97 mg/dL).  Liver Function Tests: Recent Labs  Lab 02/04/24 1500  AST 32  ALT 19  ALKPHOS 47  BILITOT 0.6  PROT 6.9  ALBUMIN 3.6    CBG: Recent Labs  Lab 02/04/24 1500  GLUCAP 107*     No results found for this or any previous visit (from the past 240 hours).       Radiology Studies: MR BRAIN WO CONTRAST Result Date: 02/04/2024 EXAM: MRI BRAIN WITHOUT CONTRAST 02/04/2024 05:33:21 PM TECHNIQUE: Multiplanar multisequence MRI of the head/brain was performed without the administration of intravenous contrast. COMPARISON: 02/04/2024 CLINICAL HISTORY: Neuro deficit, acute, stroke suspected. FINDINGS: BRAIN AND VENTRICLES: Acute infarct along the lateral aspect of the left central sulcus, near the right facial sensory motor  region. Background of mild chronic small vessel disease. Punctate focus of susceptibility artifact on axial image 43 series 12 localizing to the left precentral sulcus and possibly representing a small branch vessel occlusion in the left MCA territory not seen on the same day CTA. No intracranial hemorrhage. No mass. No midline shift. No hydrocephalus. The sella is unremarkable. Normal flow voids. ORBITS: No acute abnormality. SINUSES AND MASTOIDS: No acute abnormality. BONES AND SOFT TISSUES: Normal marrow signal. No acute soft tissue abnormality. IMPRESSION: 1. Acute infarct along the lateral aspect of the left central sulcus, near the right facial sensory motor region. 2. Punctate focus of susceptibility artifact in the left precentral sulcus, possibly representing a small branch vessel occlusion in the left MCA territory, not seen on the same day CTA. Electronically signed by: Audra Blend MD 02/04/2024 06:12 PM EDT RP Workstation: [id number]   CT ANGIO HEAD NECK W WO CM (CODE STROKE) Result Date: 02/04/2024 EXAM: CTA Head and Neck with Intravenous Contrast. CLINICAL HISTORY: Neuro deficit, acute, stroke suspected. TECHNIQUE: Axial CTA images of the head and neck performed with intravenous contrast. Two-dimensional MIP and/or three-dimensional MIP and volume rendered reformations were performed. Note: Per PQRS, the description of internal carotid artery percent stenosis, including 0 percent or normal exam, is based on [place] Symptomatic Carotid Endarterectomy Trial (NASCET) criteria. Dose reduction technique was used including one or more of the following: automated exposure control, adjustment of mA and kV according to patient size, and/or iterative reconstruction. CONTRAST: With; 75mL (iohexol (OMNIPAQUE) 350 MG/ML injection 75 mL IOHEXOL 350 MG/ML SOLN) COMPARISON: Head CT 02/03/2020. FINDINGS: CTA NECK: COMMON CAROTID ARTERIES: Calcified plaque along the bilateral carotid bulbs and  hemodynamically significant stenosis. 2-vessel arch configuration with common origin of the right brachiocephalic artery and left common carotid artery. INTERNAL CAROTID ARTERIES: Atherosclerotic calcifications of the carotid siphons without significant stenosis or aneurysm. VERTEBRAL ARTERIES: Calcified plaque at the bilateral vertebral artery origins results in moderate stenosis bilaterally. Mild calcified plaque along the right V3/V4 junction and proximal left V4 segment. CTA HEAD: No large vessel occlusion. ANTERIOR CEREBRAL ARTERIES: No significant stenosis. No occlusion. No aneurysm. MIDDLE CEREBRAL ARTERIES: No significant stenosis. No occlusion. No aneurysm. POSTERIOR CEREBRAL ARTERIES: Persistent fetal origin of the left PCA with hypoplastic left P1 segment. No significant stenosis. No occlusion. No aneurysm. BASILAR ARTERY: No significant stenosis. No occlusion. No aneurysm. SOFT TISSUES: No acute abnormality. No masses or lymphadenopathy. BONES: No acute abnormality. IMPRESSION: 1. No large vessel occlusion. 2. 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 Electronically signed by: Audra Blend MD 02/04/2024 03:42 PM EDT RP Workstation: [id number]   CT HEAD CODE STROKE WO CONTRAST Result Date: 02/04/2024 EXAM: CT HEAD WITHOUT 02/04/2024 03:04:46  PM TECHNIQUE: CT of the head was performed without the administration of intravenous contrast. Automated exposure control, iterative reconstruction, and/or weight based adjustment of the mA/kV was utilized to reduce the radiation dose to as low as reasonably achievable. COMPARISON: MRI brain 02/15/2009 CLINICAL HISTORY: Neuro deficit, acute, stroke suspected. FINDINGS: BRAIN AND VENTRICLES: No acute intracranial hemorrhage. No mass effect or midline shift. No extra-axial fluid collection. Gray-white differentiation is maintained. No hydrocephalus. [place] stroke program early CT (aspect) score Ganglionic (caudate, ic,  lentiform nucleus, insula, M1-m3): 7 Supraganglionic (m4-m6): 3 Total: 10 ORBITS: No acute abnormality. SINUSES AND MASTOIDS: No acute abnormality. SOFT TISSUES AND SKULL: No acute skull fracture. No acute soft tissue abnormality. IMPRESSION: 1. No acute intracranial abnormality. 2. ASPECT score: 10. Code stroke results were communicated to Dr. Alecia Ames at 3:10 PM on 02/04/2024 by secure text paging. Electronically signed by: Audra Blend MD 02/04/2024 03:11 PM EDT RP Workstation: [id number]        Scheduled Meds:  aspirin  81 mg Oral Daily   cholecalciferol  2,000 Units Oral Daily   clopidogrel  75 mg Oral Daily   ezetimibe   10 mg Oral Daily   metoprolol  succinate  12.5 mg Oral Daily   rosuvastatin   40 mg Oral QPM   sodium chloride  flush  3 mL Intravenous Once   Continuous Infusions:   LOS: 1 day       Seena Dadds, MD [facility]   To contact the attending provider between 7A-7P or the covering provider during after hours 7P-7A, please log into the web site [email or web address] and access using universal [place] password for that web site. If you do not have the password, please call the hospital operator.  02/05/2024, 10:58 AM

## 2024-02-05 NOTE — Progress Notes (Addendum)
 STROKE TEAM PROGRESS NOTE   INTERIM HISTORY/SUBJECTIVE Seen in room with son at the bedside. Currently on DAPT, states she was not consistently taking her baby aspirin due to stomach issues.   OBJECTIVE  CBC    Component Value Date/Time   WBC 5.4 02/05/2024 0844   RBC 4.79 02/05/2024 0844   HGB 13.1 02/05/2024 0844   HGB 12.4 07/26/2018 0816   HCT 41.6 02/05/2024 0844   PLT 213 02/05/2024 0844   PLT 217 07/26/2018 0816   MCV 86.8 02/05/2024 0844   MCV 80.6 03/10/2015 1133   MCH 27.3 02/05/2024 0844   MCHC 31.5 02/05/2024 0844   RDW 15.0 02/05/2024 0844   LYMPHSABS 1.8 02/04/2024 1500   MONOABS 0.4 02/04/2024 1500   EOSABS 0.2 02/04/2024 1500   BASOSABS 0.1 02/04/2024 1500    BMET    Component Value Date/Time   NA 137 02/05/2024 0844   NA 140 09/15/2022 1337   K 3.4 (L) 02/05/2024 0844   CL 105 02/05/2024 0844   CO2 21 (L) 02/05/2024 0844   GLUCOSE 96 02/05/2024 0844   BUN 12 02/05/2024 0844   BUN 19 09/15/2022 1337   CREATININE 0.97 02/05/2024 0844   CREATININE 1.21 (H) 07/26/2018 0816   CALCIUM 9.2 02/05/2024 0844   EGFR 43.0 06/30/2023 1112   EGFR 46 (L) 09/15/2022 1337   GFRNONAA 59 (L) 02/05/2024 0844   GFRNONAA 43 (L) 07/26/2018 0816    IMAGING past 24 hours MR BRAIN WO CONTRAST Result Date: 02/04/2024 EXAM: MRI BRAIN WITHOUT CONTRAST 02/04/2024 05:33:21 PM TECHNIQUE: Multiplanar multisequence MRI of the head/brain was performed without the administration of intravenous contrast. COMPARISON: 02/04/2024 CLINICAL HISTORY: Neuro deficit, acute, stroke suspected. FINDINGS: BRAIN AND VENTRICLES: Acute infarct along the lateral aspect of the left central sulcus, near the right facial sensory motor region. Background of mild chronic small vessel disease. Punctate focus of susceptibility artifact on axial image 43 series 12 localizing to the left precentral sulcus and possibly representing a small branch vessel occlusion in the left MCA territory not seen on the same  day CTA. No intracranial hemorrhage. No mass. No midline shift. No hydrocephalus. The sella is unremarkable. Normal flow voids. ORBITS: No acute abnormality. SINUSES AND MASTOIDS: No acute abnormality. BONES AND SOFT TISSUES: Normal marrow signal. No acute soft tissue abnormality. IMPRESSION: 1. Acute infarct along the lateral aspect of the left central sulcus, near the right facial sensory motor region. 2. Punctate focus of susceptibility artifact in the left precentral sulcus, possibly representing a small branch vessel occlusion in the left MCA territory, not seen on the same day CTA. Electronically signed by: Audra Blend MD 02/04/2024 06:12 PM EDT RP Workstation: [id number]   CT ANGIO HEAD NECK W WO CM (CODE STROKE) Result Date: 02/04/2024 EXAM: CTA Head and Neck with Intravenous Contrast. CLINICAL HISTORY: Neuro deficit, acute, stroke suspected. TECHNIQUE: Axial CTA images of the head and neck performed with intravenous contrast. Two-dimensional MIP and/or three-dimensional MIP and volume rendered reformations were performed. Note: Per PQRS, the description of internal carotid artery percent stenosis, including 0 percent or normal exam, is based on [place] Symptomatic Carotid Endarterectomy Trial (NASCET) criteria. Dose reduction technique was used including one or more of the following: automated exposure control, adjustment of mA and kV according to patient size, and/or iterative reconstruction. CONTRAST: With; 75mL (iohexol (OMNIPAQUE) 350 MG/ML injection 75 mL IOHEXOL 350 MG/ML SOLN) COMPARISON: Head CT 02/03/2020. FINDINGS: CTA NECK: COMMON CAROTID ARTERIES: Calcified plaque along the bilateral carotid bulbs and  hemodynamically significant stenosis. 2-vessel arch configuration with common origin of the right brachiocephalic artery and left common carotid artery. INTERNAL CAROTID ARTERIES: Atherosclerotic calcifications of the carotid siphons without significant stenosis or aneurysm. VERTEBRAL  ARTERIES: Calcified plaque at the bilateral vertebral artery origins results in moderate stenosis bilaterally. Mild calcified plaque along the right V3/V4 junction and proximal left V4 segment. CTA HEAD: No large vessel occlusion. ANTERIOR CEREBRAL ARTERIES: No significant stenosis. No occlusion. No aneurysm. MIDDLE CEREBRAL ARTERIES: No significant stenosis. No occlusion. No aneurysm. POSTERIOR CEREBRAL ARTERIES: Persistent fetal origin of the left PCA with hypoplastic left P1 segment. No significant stenosis. No occlusion. No aneurysm. BASILAR ARTERY: No significant stenosis. No occlusion. No aneurysm. SOFT TISSUES: No acute abnormality. No masses or lymphadenopathy. BONES: No acute abnormality. IMPRESSION: 1. No large vessel occlusion. 2. 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 Electronically signed by: Audra Blend MD 02/04/2024 03:42 PM EDT RP Workstation: [id number]   CT HEAD CODE STROKE WO CONTRAST Result Date: 02/04/2024 EXAM: CT HEAD WITHOUT 02/04/2024 03:04:46 PM TECHNIQUE: CT of the head was performed without the administration of intravenous contrast. Automated exposure control, iterative reconstruction, and/or weight based adjustment of the mA/kV was utilized to reduce the radiation dose to as low as reasonably achievable. COMPARISON: MRI brain 02/15/2009 CLINICAL HISTORY: Neuro deficit, acute, stroke suspected. FINDINGS: BRAIN AND VENTRICLES: No acute intracranial hemorrhage. No mass effect or midline shift. No extra-axial fluid collection. Gray-white differentiation is maintained. No hydrocephalus. [place] stroke program early CT (aspect) score Ganglionic (caudate, ic, lentiform nucleus, insula, M1-m3): 7 Supraganglionic (m4-m6): 3 Total: 10 ORBITS: No acute abnormality. SINUSES AND MASTOIDS: No acute abnormality. SOFT TISSUES AND SKULL: No acute skull fracture. No acute soft tissue abnormality. IMPRESSION: 1. No acute intracranial abnormality. 2.  ASPECT score: 10. Code stroke results were communicated to Dr. Alecia Ames at 3:10 PM on 02/04/2024 by secure text paging. Electronically signed by: Audra Blend MD 02/04/2024 03:11 PM EDT RP Workstation: [id number]    Vitals:   02/05/24 0300 02/05/24 0400 02/05/24 0401 02/05/24 0858  BP:  126/69  (!) 144/74  Pulse: 67 63 67   Resp: 15 19 12    Temp:   98.9 F (37.2 C)   TempSrc:   Oral   SpO2: 96% 93% 93%   Weight:      Height:         PHYSICAL EXAM General:  Alert, well-nourished, well-developed patient in no acute distress Psych:  Mood and affect appropriate for situation CV: Regular rate and rhythm on monitor Respiratory:  Regular, unlabored respirations on room air GI: Abdomen soft and nontender   NEURO:  Mental Status: AA&Ox3, patient is able to give clear and coherent history Speech/Language: speech is without dysarthria or aphasia.  Naming, repetition, fluency, and comprehension intact.  Cranial Nerves:  II: PERRL. Visual fields full.  III, IV, VI: EOMI. Eyelids elevate symmetrically.  V: Sensation is intact to light touch and symmetrical to face.  VII: Right facial droop  VIII: hearing intact to voice. IX, X: Palate elevates symmetrically. Phonation is normal.  GN:FAOZHYQM shrug 5/5. XII: tongue is midline without fasciculations. Motor: RUE 4/5 with some drift LUE 5/5 RLE and LLE 5/5 Tone: is normal and bulk is normal Sensation- Intact to light touch bilaterally. Extinction absent to light touch to DSS.   Coordination: ataxia noted on the left , HKS: no ataxia in BLE.No drift.  Gait- deferred   ASSESSMENT/PLAN  Ms. DANITRA PAYANO is a 83 y.o. female  with history of HTN, HLD, CAD s/p CABG, MI,cardiomyopathy, CKD, anxiety and cancer admitted for right facial droop, slurred speech, and right side weakness .  NIH on Admission 5  Acute Ischemic Infarct: Acute infarct along the left central sulcus  Etiology:  vessel disease   Code Stroke CT head No acute  abnormality. ASPECTS 10.    CTA head & neck No large vessel occlusion.  MRI  Acute infarct along the lateral aspect of the left central sulcus, near the right facial sensory motor region. Punctate focus of susceptibility artifact in the left precentral sulcus, possibly representing a small branch vessel occlusion in the left MCA territory 2D Echo Pending  LDL 64 HgbA1c 5.7 VTE prophylaxis - SCDs aspirin 81 mg daily prior to admission, now on aspirin 81 mg daily and clopidogrel 75 mg daily for 3 weeks and then plavix alone. Therapy recommendations:  Outpatient PT/OT/ST Disposition:  Pending   CAD s/p CABG Was previous on ASA but not taking consistently due to GI reflux  Cardiomyopathy Hypertension Home meds:  Norvasc , Entresto   Stable Blood Pressure Goal: BP less than 220/110   Hyperlipidemia Home meds:  crestor  and zetia , resumed in hospital LDL 64, goal < 70 Continue statin at discharge  Dysphagia Patient has post-stroke dysphagia, SLP consulted    Diet   Diet regular Fluid consistency: Thin   Advance diet as tolerated  Other Stroke Risk Factors Obesity, Body mass index is 39.06 kg/m., BMI >/= 30 associated with increased stroke risk, recommend weight loss, diet and exercise as appropriate   Other Active Problems CKD 3a Cr 1.20, GFR 47  Hospital day # 1  Patient seen and examined by NP/APP with MD. MD to update note as needed.   Imogene Mana, DNP, FNP-BC [facility] Pager: [phone]  ATTENDING ATTESTATION:  This is a patient with right MCA on MRI.  Stroke workup completed sufferer echo is pending.  DAPT as above Plavix due to aspirin causing GI upset.  If echo is negative neurology will sign off  Can follow-up in stroke clinic in 6 to 8 weeks  Dr. Dahlia Dross evaluated pt independently, reviewed imaging, chart, labs. Discussed and formulated plan with the Resident/APP. Changes were made to the note where appropriate. Please see APP/resident note  above for details.   Total 36 minutes spent on counseling patient and coordinating care, writing notes and reviewing chart.   Crytal Pensinger,MD    To contact Stroke Continuity provider, please refer to [email or web address]. After hours, contact General Neurology

## 2024-02-05 NOTE — Plan of Care (Signed)
 Patient AAOx4. No c/o pain or SOB. OOB ambulating in room and hall with standby assist. Tolerating diet. Bed alarm on and call bell in reach.  Problem: Education: Goal: Knowledge of disease or condition will improve Outcome: Progressing Goal: Knowledge of secondary prevention will improve (MUST DOCUMENT ALL) Outcome: Progressing Goal: Knowledge of patient specific risk factors will improve (DELETE if not current risk factor) Outcome: Progressing   Problem: Ischemic Stroke/TIA Tissue Perfusion: Goal: Complications of ischemic stroke/TIA will be minimized Outcome: Progressing   Problem: Coping: Goal: Will verbalize positive feelings about self Outcome: Progressing Goal: Will identify appropriate support needs Outcome: Progressing   Problem: Health Behavior/Discharge Planning: Goal: Ability to manage health-related needs will improve Outcome: Progressing Goal: Goals will be collaboratively established with patient/family Outcome: Progressing   Problem: Self-Care: Goal: Ability to participate in self-care as condition permits will improve Outcome: Progressing Goal: Verbalization of feelings and concerns over difficulty with self-care will improve Outcome: Progressing Goal: Ability to communicate needs accurately will improve Outcome: Progressing   Problem: Nutrition: Goal: Risk of aspiration will decrease Outcome: Progressing Goal: Dietary intake will improve Outcome: Progressing   Problem: Education: Goal: Knowledge of General Education information will improve Description: Including pain rating scale, medication(s)/side effects and non-pharmacologic comfort measures Outcome: Progressing   Problem: Health Behavior/Discharge Planning: Goal: Ability to manage health-related needs will improve Outcome: Progressing   Problem: Clinical Measurements: Goal: Ability to maintain clinical measurements within normal limits will improve Outcome: Progressing Goal: Will remain free  from infection Outcome: Progressing Goal: Diagnostic test results will improve Outcome: Progressing Goal: Respiratory complications will improve Outcome: Progressing Goal: Cardiovascular complication will be avoided Outcome: Progressing   Problem: Activity: Goal: Risk for activity intolerance will decrease Outcome: Progressing   Problem: Nutrition: Goal: Adequate nutrition will be maintained Outcome: Progressing   Problem: Coping: Goal: Level of anxiety will decrease Outcome: Progressing   Problem: Elimination: Goal: Will not experience complications related to bowel motility Outcome: Progressing Goal: Will not experience complications related to urinary retention Outcome: Progressing   Problem: Pain Managment: Goal: General experience of comfort will improve and/or be controlled Outcome: Progressing   Problem: Safety: Goal: Ability to remain free from injury will improve Outcome: Progressing   Problem: Skin Integrity: Goal: Risk for impaired skin integrity will decrease Outcome: Progressing

## 2024-02-05 NOTE — Evaluation (Signed)
 Occupational Therapy Evaluation Patient Details Name: Alyssa Cardenas MRN: [id number] DOB: 1941-07-04 Today's Date: 02/05/2024   History of Present Illness   Pt is a 83 y/o female presenting on 5/31 with R facial droop, slurred speech and R sided weakness. CT negative. 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 PMH includes: HTN, CAD s/p CABG, MI, cardiomyopathy, CKD, anxiety, breast cancer.     Clinical Impressions PTA patient independent and driving. Admitted for above and presents with problem list below.  Patient managing bed mobility with modified independence, transfers with contact guard and mobility in room with contact guard assist.  She is able to complete ADLs with up to contact guard assist.  Remains limited by R dominant UE weakness, impaired sensation and coordination.  Encouraged pt to work on bil shoulder flexion and R hand grasp exercises.  Anticipate she will progress well and she has great support at home.  Based on performance today, pt will best benefit from continued OT services acutely and after dc at outpatient OT in order to optimize safety, independence and return to PLOF.      If plan is discharge home, recommend the following:   A little help with walking and/or transfers;A little help with bathing/dressing/bathroom;Assistance with cooking/housework;Assist for transportation;Help with stairs or ramp for entrance     Functional Status Assessment   Patient has had a recent decline in their functional status and demonstrates the ability to make significant improvements in function in a reasonable and predictable amount of time.     Equipment Recommendations   None recommended by OT     Recommendations for Other Services         Precautions/Restrictions   Precautions Precautions: Fall Restrictions Weight Bearing Restrictions Per Provider Order: No     Mobility Bed  Mobility Overal bed mobility: Modified Independent             General bed mobility comments: HOB slightly elevated but no assist required, exiting towards R side    Transfers Overall transfer level: Needs assistance Equipment used: None Transfers: Sit to/from Stand Sit to Stand: Contact guard assist           General transfer comment: for safety, no physical assist or loss of balance noted      Balance Overall balance assessment: Mild deficits observed, not formally tested                                         ADL either performed or assessed with clinical judgement   ADL Overall ADL's : Needs assistance/impaired     Grooming: Contact guard assist;Wash/dry hands;Standing           Upper Body Dressing : Sitting;Minimal assistance   Lower Body Dressing: Sit to/from stand;Contact guard assist   Toilet Transfer: Contact guard assist;Ambulation   Toileting- Clothing Manipulation and Hygiene: Contact guard assist;Sit to/from stand;Sitting/lateral lean       Functional mobility during ADLs: Contact guard assist       Vision   Vision Assessment?: No apparent visual deficits     Perception         Praxis         Pertinent Vitals/Pain Pain Assessment Pain Assessment: No/denies pain     Extremity/Trunk Assessment Upper Extremity Assessment Upper Extremity Assessment: Right hand dominant;RUE deficits/detail RUE Deficits /  Details: 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, grossly weaker 3-/5 with decreased coordination and sensation RUE Sensation: decreased light touch RUE Coordination: decreased fine motor;decreased gross motor   Lower Extremity Assessment Lower Extremity Assessment: Defer to PT evaluation   Cervical / Trunk Assessment Cervical / Trunk Assessment: Other exceptions Cervical / Trunk Exceptions: increased body habitus   Communication Communication Communication: Impaired Factors Affecting Communication:  Reduced clarity of speech (mildly slurred)   Cognition Arousal: Alert Behavior During Therapy: WFL for tasks assessed/performed Cognition: No apparent impairments             OT - Cognition Comments: oriented and follows commands, appears WFL                 Following commands: Intact       Cueing  General Comments   Cueing Techniques: Verbal cues  VSS on RA.  HR upto 100 with activity   Exercises     Shoulder Instructions      Home Living Family/patient expects to be discharged to:: Private residence Living Arrangements: Spouse/significant other Available Help at Discharge: Family;Available 24 hours/day Type of Home: House Home Access: Stairs to enter [employer] of Steps: 1 Entrance Stairs-Rails: Can reach both Home Layout: One level     Bathroom Shower/Tub: Producer, television/film/video: Standard     Home Equipment: Shower seat - built in;Grab bars - tub/shower          Prior Functioning/Environment Prior Level of Function : Independent/Modified Independent;Driving               ADLs Comments: reports driving, managing ADLs, IADLs    OT Problem List: Decreased strength;Decreased activity tolerance;Impaired balance (sitting and/or standing);Impaired UE functional use;Impaired sensation;Decreased coordination   OT Treatment/Interventions: Self-care/ADL training;Neuromuscular education;Therapeutic activities;Balance training;Patient/family education      OT Goals(Current goals can be found in the care plan section)   Acute Rehab OT Goals Patient Stated Goal: get better OT Goal Formulation: With patient Time For Goal Achievement: 02/19/24 Potential to Achieve Goals: Good   OT Frequency:  Min 2X/week    Co-evaluation              AM-PAC OT "6 Clicks" Daily Activity     Outcome Measure Help from another person eating meals?: Total (NPO) Help from another person taking care of personal grooming?: A Little Help  from another person toileting, which includes using toliet, bedpan, or urinal?: A Little Help from another person bathing (including washing, rinsing, drying)?: A Little Help from another person to put on and taking off regular upper body clothing?: A Little Help from another person to put on and taking off regular lower body clothing?: A Little 6 Click Score: 16   End of Session Equipment Utilized During Treatment: Gait belt Nurse Communication: Mobility status  Activity Tolerance: Patient tolerated treatment well Patient left: in bed;with call bell/phone within reach;with bed alarm set  OT Visit Diagnosis: Muscle weakness (generalized) (M62.81);Other symptoms and signs involving the nervous system (R29.898)                Time: 1610-9604 OT Time Calculation (min): 22 min Charges:  OT General Charges $OT Visit: 1 Visit OT Evaluation $OT Eval Low Complexity: 1 Low  Bary Boss, OT Acute Rehabilitation Services Office [phone] Secure Chat Preferred    Fredrich Jefferson 02/05/2024, 9:18 AM

## 2024-02-05 NOTE — Plan of Care (Signed)

## 2024-02-06 ENCOUNTER — Other Ambulatory Visit: Payer: Self-pay | Admitting: Cardiology

## 2024-02-06 ENCOUNTER — Inpatient Hospital Stay (HOSPITAL_COMMUNITY)

## 2024-02-06 ENCOUNTER — Other Ambulatory Visit (HOSPITAL_COMMUNITY): Payer: Self-pay

## 2024-02-06 DIAGNOSIS — I129 Hypertensive chronic kidney disease with stage 1 through stage 4 chronic kidney disease, or unspecified chronic kidney disease: Secondary | ICD-10-CM | POA: Diagnosis not present

## 2024-02-06 DIAGNOSIS — I639 Cerebral infarction, unspecified: Secondary | ICD-10-CM | POA: Diagnosis not present

## 2024-02-06 DIAGNOSIS — Z7982 Long term (current) use of aspirin: Secondary | ICD-10-CM

## 2024-02-06 DIAGNOSIS — I69391 Dysphagia following cerebral infarction: Secondary | ICD-10-CM | POA: Diagnosis not present

## 2024-02-06 DIAGNOSIS — I6389 Other cerebral infarction: Secondary | ICD-10-CM | POA: Diagnosis not present

## 2024-02-06 DIAGNOSIS — I739 Peripheral vascular disease, unspecified: Secondary | ICD-10-CM | POA: Diagnosis not present

## 2024-02-06 LAB — CBC
HCT: 39.9 % (ref 36.0–46.0)
Hemoglobin: 12.3 g/dL (ref 12.0–15.0)
MCH: 27 pg (ref 26.0–34.0)
MCHC: 30.8 g/dL (ref 30.0–36.0)
MCV: 87.7 fL (ref 80.0–100.0)
Platelets: 212 10*3/uL (ref 150–400)
RBC: 4.55 MIL/uL (ref 3.87–5.11)
RDW: 15.2 % (ref 11.5–15.5)
WBC: 5.1 10*3/uL (ref 4.0–10.5)
nRBC: 0 % (ref 0.0–0.2)

## 2024-02-06 LAB — BASIC METABOLIC PANEL WITH GFR
Anion gap: 10 (ref 5–15)
BUN: 14 mg/dL (ref 8–23)
CO2: 23 mmol/L (ref 22–32)
Calcium: 8.8 mg/dL — ABNORMAL LOW (ref 8.9–10.3)
Chloride: 103 mmol/L (ref 98–111)
Creatinine, Ser: 1.22 mg/dL — ABNORMAL HIGH (ref 0.44–1.00)
GFR, Estimated: 45 mL/min — ABNORMAL LOW (ref >60)
Glucose, Bld: 96 mg/dL (ref 70–99)
Potassium: 3.7 mmol/L (ref 3.5–5.1)
Sodium: 136 mmol/L (ref 135–145)

## 2024-02-06 LAB — ECHOCARDIOGRAM COMPLETE
Area-P 1/2: 5.13 cm2
Height: 60 in
S' Lateral: 3.5 cm
Weight: 3200 [oz_av]

## 2024-02-06 MED ORDER — POTASSIUM CHLORIDE CRYS ER 20 MEQ PO TBCR
40.0000 meq | EXTENDED_RELEASE_TABLET | Freq: Four times a day (QID) | ORAL | Status: AC
  Administered 2024-02-06 (×2): 40 meq via ORAL
  Filled 2024-02-06 (×2): qty 2

## 2024-02-06 MED ORDER — METOPROLOL SUCCINATE ER 50 MG PO TB24
25.0000 mg | ORAL_TABLET | Freq: Every day | ORAL | Status: AC
Start: 2024-02-06 — End: ?

## 2024-02-06 MED ORDER — ASPIRIN EC 81 MG PO TBEC
81.0000 mg | DELAYED_RELEASE_TABLET | ORAL | 0 refills | Status: DC | PRN
  Filled 2024-02-06: qty 30, 30d supply, fill #0

## 2024-02-06 MED ORDER — PANTOPRAZOLE SODIUM 40 MG PO TBEC
40.0000 mg | DELAYED_RELEASE_TABLET | Freq: Every day | ORAL | 0 refills | Status: AC
  Filled 2024-02-06: qty 30, 30d supply, fill #0

## 2024-02-06 MED ORDER — CLOPIDOGREL BISULFATE 75 MG PO TABS
75.0000 mg | ORAL_TABLET | Freq: Every day | ORAL | 0 refills | Status: AC
  Filled 2024-02-06: qty 18, 18d supply, fill #0

## 2024-02-06 MED ORDER — ASPIRIN EC 81 MG PO TBEC
81.0000 mg | DELAYED_RELEASE_TABLET | Freq: Every day | ORAL | 0 refills | Status: DC
  Filled 2024-02-06: qty 30, 30d supply, fill #0

## 2024-02-06 NOTE — Discharge Summary (Addendum)
 Physician Discharge Summary  Alyssa Cardenas YQM:[id number] DOB: 1941/02/27 DOA: 02/04/2024  PCP: Barnetta Liberty, MD  Admit date: 02/04/2024 Discharge date: 02/06/2024  Admitted From: (Home) Disposition:  (Home)  Recommendations for Outpatient Follow-up:  Follow up with PCP in 1-2 weeks Please obtain BMP/CBC in one week 30 days heart monitor to be arranged by cardiology.      Diet recommendation: Heart Healthy Brief/Interim Summary:   Alyssa Cardenas is a 83 y.o. female with medical history significant of CAD s/p CABG, ICM, HFpEF, HTN, HLD, and breast cancer p/w c/f acute stroke. -Patient presents to ED secondary to complaint of numbness, right upper extremity weakness, right facial droop and slurred speech, MRI brain significant for acute CVA, admitted for further workup    Acute CVA - MRI brain significant for acute infarct of left central stenosis. - CTA head and neck with no large vessel occlusion - PT/OT/SLP consulted recommendation for outpatient OT - 2D echo with low EF 30 to 35%,anterior/anteroseptal hypokinesis that was present earlier.  -LDL is 64 ,already on statin, -A1c is 5.7 - She was on aspirin at home as needed, recommendation for dual antiplatelet therapy aspirin and Plavix x 21 days, then aspirin alone, she was prescribed Protonix for GI prophylaxis as well while on dual antiplatelet therapy. - 30 days heart monitor has been arranged per cardiology recommendation.    CAD status post CABG Ischemic cardiomyopathy Chronic systolic CHF -Known history of ischemic cardiomyopathy, most recent EF 40 to 45%.  Repeat echo during this hospital stay with lower EF 30 to 35% - GDMT therapy, including Entresto  and Toprol -XL, Entresto , recommendation for permissive hypertension during hospital stay, so most meds has been held, despite that blood pressure remains on the lower side, so at time of discharge she was instructed to resume her Toprol -XL at a lower dose 50 mg lower to 25  mg, and to continue home dose Entresto  from tomorrow .    left bundle branch block - Chronic   CKD stage III -At baseline   Hypertension - Overall blood pressure has been normal to low during hospital stay, so resume Toprol  XL at a lower dose and Entresto  for known CHF, and discontinue amlodipine .     Discharge Diagnoses:  Principal Problem:   CVA (cerebral vascular accident) [facility])    Discharge Instructions  Discharge Instructions     Diet - low sodium heart healthy   Complete by: As directed    Increase activity slowly   Complete by: As directed    No wound care   Complete by: As directed       Allergies as of 02/06/2024       Reactions   Ace Inhibitors Cough, Other (See Comments)   Latex Hives, Other (See Comments)   Tamoxifen  Tinitus, Other (See Comments)        Medication List     STOP taking these medications    amLODipine  10 MG tablet Commonly known as: NORVASC        TAKE these medications    aspirin EC 81 MG tablet Take 1 tablet (81 mg total) by mouth daily. What changed:  when to take this reasons to take this   clopidogrel 75 MG tablet Commonly known as: PLAVIX Take 1 tablet (75 mg total) by mouth daily for 18 days. Start taking on: February 07, 2024   dimenhyDRINATE 50 MG tablet Commonly known as: DRAMAMINE Take 50 mg by mouth daily as needed for dizziness.   Entresto  49-51 MG Generic  drug: sacubitril-valsartan TAKE 1 TABLET BY MOUTH TWICE DAILY   ezetimibe  10 MG tablet Commonly known as: ZETIA  TAKE 1 TABLET(10 MG) BY MOUTH DAILY What changed: See the new instructions.   fluticasone 50 MCG/ACT nasal spray Commonly known as: FLONASE Place 2 sprays into both nostrils daily as needed for allergies or rhinitis.   Krill Oil 350 MG Caps Take 350 mg by mouth daily at 12 noon.   loratadine 10 MG tablet Commonly known as: CLARITIN Take 10 mg by mouth daily as needed for allergies.   metoprolol  succinate 50 MG 24 hr tablet Commonly  known as: TOPROL -XL Take 0.5 tablets (25 mg total) by mouth daily. Take with or immediately following a meal. What changed: See the new instructions.   nitroGLYCERIN  0.4 MG SL tablet Commonly known as: NITROSTAT  Place 1 tablet (0.4 mg total) under the tongue every 5 (five) minutes as needed for chest pain.   pantoprazole 40 MG tablet Commonly known as: Protonix Take 1 tablet (40 mg total) by mouth daily.   Polyvinyl Alcohol-Povidone PF 1.4-0.6 % Soln Place 2 drops into both eyes daily as needed (for dry eyes).   potassium chloride  SA 20 MEQ tablet Commonly known as: KLOR-CON  M Take 20 mEq by mouth daily at 12 noon.   rosuvastatin  40 MG tablet Commonly known as: CRESTOR  Take 40 mg by mouth every evening.   Vitamin D  50 MCG (2000 UT) Caps Take 1 capsule (2,000 Units total) by mouth daily. What changed: when to take this        Allergies  Allergen Reactions   Ace Inhibitors Cough and Other (See Comments)   Latex Hives and Other (See Comments)   Tamoxifen  Tinitus and Other (See Comments)    Consultations: Neurology   Procedures/Studies: ECHOCARDIOGRAM COMPLETE Result Date: 02/06/2024    ECHOCARDIOGRAM REPORT   Patient Name:   Alyssa Cardenas Date of Exam: 02/06/2024 Medical Rec #:  [id number]       Height:       60.0 in Accession #:    [id number]      Weight:       200.0 lb Date of Birth:  [birth year]        BSA:          1.867 m Patient Age:    82 years        BP:           124/86 mmHg Patient Gender: F               HR:           65 bpm. Exam Location:  Inpatient Procedure: 2D Echo, Cardiac Doppler and Color Doppler (Both Spectral and Color            Flow Doppler were utilized during procedure). Indications:     Stroke  History:         Patient has prior history of Echocardiogram examinations, most                  recent 12/07/2022. Risk Factors:Hypertension.  Sonographer:     Janette Medley Referring Phys:  Laymond Priestly Diagnosing Phys: Alwin Baars IMPRESSIONS  1. The left  ventricle has moderately reduced function with an estimated EF of 30-35%. The left ventricle demonstrates regional wall motion abnormalities (see scoring diagram/findings for description). Left ventricular diastolic parameters are consistent  with Grade I diastolic dysfunction (impaired relaxation). There is hypokinesis of the mid to distal anteroseptal, anterior and inferoseptal myocardium.  This is unchanged compared to prior echocardiogram.  2. Right ventricular systolic function is normal. The right ventricular size is normal.  3. Left atrial size was mildly dilated.  4. The mitral valve is grossly normal. Mild mitral valve regurgitation.  5. The aortic valve is normal in structure. There is mild calcification of the aortic valve. Aortic valve regurgitation is not visualized.  6. The inferior vena cava is normal in size with greater than 50% respiratory variability, suggesting right atrial pressure of 3 mmHg. FINDINGS  Left Ventricle: Left ventricular ejection fraction, by estimation, is 30 to 35%. The left ventricle has moderately decreased function. The left ventricle demonstrates regional wall motion abnormalities. The left ventricular internal cavity size was normal in size. There is no left ventricular hypertrophy. Left ventricular diastolic parameters are consistent with Grade I diastolic dysfunction (impaired relaxation).  LV Wall Scoring: The mid and distal anterior septum, mid inferoseptal segment, apical anterior segment, and apex are hypokinetic. Right Ventricle: The right ventricular size is normal. No increase in right ventricular wall thickness. Right ventricular systolic function is normal. Left Atrium: Left atrial size was mildly dilated. Right Atrium: Right atrial size was normal in size. Pericardium: There is no evidence of pericardial effusion. Mitral Valve: The mitral valve is grossly normal. There is mild thickening of the mitral valve leaflet(s). Mild mitral annular calcification. Mild  mitral valve regurgitation. Tricuspid Valve: The tricuspid valve is normal in structure. Tricuspid valve regurgitation is trivial. Aortic Valve: The aortic valve is normal in structure. There is mild calcification of the aortic valve. There is mild aortic valve annular calcification. Aortic valve regurgitation is not visualized. Pulmonic Valve: The pulmonic valve was not well visualized. Pulmonic valve regurgitation is trivial. Aorta: The aortic root is normal in size and structure. Venous: The inferior vena cava is normal in size with greater than 50% respiratory variability, suggesting right atrial pressure of 3 mmHg. IAS/Shunts: No atrial level shunt detected by color flow Doppler.  LEFT VENTRICLE PLAX 2D LVIDd:         5.00 cm   Diastology LVIDs:         3.50 cm   LV e' medial:    4.13 cm/s LV PW:         1.10 cm   LV E/e' medial:  16.5 LV IVS:        1.40 cm   LV e' lateral:   4.57 cm/s LVOT diam:     2.00 cm   LV E/e' lateral: 14.9 LV SV:         76 LV SV Index:   41 LVOT Area:     3.14 cm  RIGHT VENTRICLE             IVC RV S prime:     11.00 cm/s  IVC diam: 1.30 cm TAPSE (M-mode): 1.6 cm LEFT ATRIUM             Index        RIGHT ATRIUM           Index LA diam:        3.40 cm 1.82 cm/m   RA Area:     17.00 cm LA Vol (A2C):   35.9 ml 19.23 ml/m  RA Volume:   44.70 ml  23.95 ml/m LA Vol (A4C):   44.6 ml 23.89 ml/m LA Biplane Vol: 41.9 ml 22.45 ml/m  AORTIC VALVE LVOT Vmax:   124.00 cm/s LVOT Vmean:  70.200 cm/s LVOT VTI:  0.241 m  AORTA Ao Root diam: 2.60 cm Ao Asc diam:  3.40 cm MITRAL VALVE MV Area (PHT): 5.13 cm    SHUNTS MV Decel Time: 148 msec    Systemic VTI:  0.24 m MV E velocity: 68.10 cm/s  Systemic Diam: 2.00 cm MV A velocity: 93.80 cm/s MV E/A ratio:  0.73 Aditya Sabharwal Electronically signed by Alwin Baars Signature Date/Time: 02/06/2024/1:44:22 PM    Final (Updated)    MR BRAIN WO CONTRAST Result Date: 02/04/2024 EXAM: MRI BRAIN WITHOUT CONTRAST 02/04/2024 05:33:21 PM TECHNIQUE:  Multiplanar multisequence MRI of the head/brain was performed without the administration of intravenous contrast. COMPARISON: 02/04/2024 CLINICAL HISTORY: Neuro deficit, acute, stroke suspected. FINDINGS: BRAIN AND VENTRICLES: Acute infarct along the lateral aspect of the left central sulcus, near the right facial sensory motor region. Background of mild chronic small vessel disease. Punctate focus of susceptibility artifact on axial image 43 series 12 localizing to the left precentral sulcus and possibly representing a small branch vessel occlusion in the left MCA territory not seen on the same day CTA. No intracranial hemorrhage. No mass. No midline shift. No hydrocephalus. The sella is unremarkable. Normal flow voids. ORBITS: No acute abnormality. SINUSES AND MASTOIDS: No acute abnormality. BONES AND SOFT TISSUES: Normal marrow signal. No acute soft tissue abnormality. IMPRESSION: 1. Acute infarct along the lateral aspect of the left central sulcus, near the right facial sensory motor region. 2. Punctate focus of susceptibility artifact in the left precentral sulcus, possibly representing a small branch vessel occlusion in the left MCA territory, not seen on the same day CTA. Electronically signed by: Audra Blend MD 02/04/2024 06:12 PM EDT RP Workstation: [id number]   CT ANGIO HEAD NECK W WO CM (CODE STROKE) Result Date: 02/04/2024 EXAM: CTA Head and Neck with Intravenous Contrast. CLINICAL HISTORY: Neuro deficit, acute, stroke suspected. TECHNIQUE: Axial CTA images of the head and neck performed with intravenous contrast. Two-dimensional MIP and/or three-dimensional MIP and volume rendered reformations were performed. Note: Per PQRS, the description of internal carotid artery percent stenosis, including 0 percent or normal exam, is based on [place] American Symptomatic Carotid Endarterectomy Trial (NASCET) criteria. Dose reduction technique was used including one or more of the following: automated exposure  control, adjustment of mA and kV according to patient size, and/or iterative reconstruction. CONTRAST: With; 75mL (iohexol (OMNIPAQUE) 350 MG/ML injection 75 mL IOHEXOL 350 MG/ML SOLN) COMPARISON: Head CT 02/03/2020. FINDINGS: CTA NECK: COMMON CAROTID ARTERIES: Calcified plaque along the bilateral carotid bulbs and hemodynamically significant stenosis. 2-vessel arch configuration with common origin of the right brachiocephalic artery and left common carotid artery. INTERNAL CAROTID ARTERIES: Atherosclerotic calcifications of the carotid siphons without significant stenosis or aneurysm. VERTEBRAL ARTERIES: Calcified plaque at the bilateral vertebral artery origins results in moderate stenosis bilaterally. Mild calcified plaque along the right V3/V4 junction and proximal left V4 segment. CTA HEAD: No large vessel occlusion. ANTERIOR CEREBRAL ARTERIES: No significant stenosis. No occlusion. No aneurysm. MIDDLE CEREBRAL ARTERIES: No significant stenosis. No occlusion. No aneurysm. POSTERIOR CEREBRAL ARTERIES: Persistent fetal origin of the left PCA with hypoplastic left P1 segment. No significant stenosis. No occlusion. No aneurysm. BASILAR ARTERY: No significant stenosis. No occlusion. No aneurysm. SOFT TISSUES: No acute abnormality. No masses or lymphadenopathy. BONES: No acute abnormality. IMPRESSION: 1. No large vessel occlusion. 2. 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 Electronically signed by: Audra Blend MD 02/04/2024 03:42 PM EDT RP Workstation: [id number]   CT HEAD CODE STROKE WO CONTRAST Result Date:  02/04/2024 EXAM: CT HEAD WITHOUT 02/04/2024 03:04:46 PM TECHNIQUE: CT of the head was performed without the administration of intravenous contrast. Automated exposure control, iterative reconstruction, and/or weight based adjustment of the mA/kV was utilized to reduce the radiation dose to as low as reasonably achievable. COMPARISON: MRI brain 02/15/2009 CLINICAL  HISTORY: Neuro deficit, acute, stroke suspected. FINDINGS: BRAIN AND VENTRICLES: No acute intracranial hemorrhage. No mass effect or midline shift. No extra-axial fluid collection. Gray-white differentiation is maintained. No hydrocephalus. [place] stroke program early CT (aspect) score Ganglionic (caudate, ic, lentiform nucleus, insula, M1-m3): 7 Supraganglionic (m4-m6): 3 Total: 10 ORBITS: No acute abnormality. SINUSES AND MASTOIDS: No acute abnormality. SOFT TISSUES AND SKULL: No acute skull fracture. No acute soft tissue abnormality. IMPRESSION: 1. No acute intracranial abnormality. 2. ASPECT score: 10. Code stroke results were communicated to Dr. Alecia Ames at 3:10 PM on 02/04/2024 by secure text paging. Electronically signed by: Audra Blend MD 02/04/2024 03:11 PM EDT RP Workstation: [id number]      Subjective:  No significant events overnight, reports that her weakness much improved, as well her speech has significantly improved. Discharge Exam: Vitals:   02/06/24 0000 02/06/24 0425  BP: 123/73 124/86  Pulse: 68 69  Resp: 20 18  Temp:  98.5 F (36.9 C)  SpO2: 93% 92%   Vitals:   02/05/24 1951 02/05/24 2302 02/06/24 0000 02/06/24 0425  BP: 109/61 118/68 123/73 124/86  Pulse: 71 76 68 69  Resp: 16 16 20 18   Temp: 97.6 F (36.4 C) (!) 97.1 F (36.2 C)  98.5 F (36.9 C)  TempSrc: Oral Oral  Oral  SpO2: 96% 91% 93% 92%  Weight:      Height:        General: Pt is alert, awake, not in acute distress Cardiovascular: RRR, S1/S2 +, no rubs, no gallops Respiratory: CTA bilaterally, no wheezing, no rhonchi Abdominal: Soft, NT, ND, bowel sounds + Extremities: no edema, no cyanosis    The results of significant diagnostics from this hospitalization (including imaging, microbiology, ancillary and laboratory) are listed below for reference.     Microbiology: No results found for this or any previous visit (from the past 240 hours).   Labs: BNP (last 3 results) No results for  input(s): "BNP" in the last 8760 hours. Basic Metabolic Panel: Recent Labs  Lab 02/04/24 1500 02/04/24 1505 02/05/24 0844 02/06/24 0850  NA 138 140 137 136  K 3.5 3.6 3.4* 3.7  CL 106 107 105 103  CO2 21*  --  21* 23  GLUCOSE 116* 115* 96 96  BUN 16 17 12 14   CREATININE 1.17* 1.20* 0.97 1.22*  CALCIUM 9.1  --  9.2 8.8*   Liver Function Tests: Recent Labs  Lab 02/04/24 1500  AST 32  ALT 19  ALKPHOS 47  BILITOT 0.6  PROT 6.9  ALBUMIN 3.6   No results for input(s): "LIPASE", "AMYLASE" in the last 168 hours. No results for input(s): "AMMONIA" in the last 168 hours. CBC: Recent Labs  Lab 02/04/24 1500 02/04/24 1505 02/05/24 0844 02/06/24 0850  WBC 4.8  --  5.4 5.1  NEUTROABS 2.4  --   --   --   HGB 12.7 13.3 13.1 12.3  HCT 40.5 39.0 41.6 39.9  MCV 87.9  --  86.8 87.7  PLT 193  --  213 212   Cardiac Enzymes: No results for input(s): "CKTOTAL", "CKMB", "CKMBINDEX", "TROPONINI" in the last 168 hours. BNP: Invalid input(s): "POCBNP" CBG: Recent Labs  Lab 02/04/24 1500  GLUCAP  107*   D-Dimer No results for input(s): "DDIMER" in the last 72 hours. Hgb A1c Recent Labs    02/04/24 1500  HGBA1C 5.7*   Lipid Profile Recent Labs    02/05/24 0217  CHOL 141  HDL 70  LDLCALC 64  TRIG 37  CHOLHDL 2.0   Thyroid function studies No results for input(s): "TSH", "T4TOTAL", "T3FREE", "THYROIDAB" in the last 72 hours.  Invalid input(s): "FREET3" Anemia work up No results for input(s): "VITAMINB12", "FOLATE", "FERRITIN", "TIBC", "IRON", "RETICCTPCT" in the last 72 hours. Urinalysis    Component Value Date/Time   COLORURINE YELLOW 02/15/2009 1826   APPEARANCEUR CLEAR 02/15/2009 1826   LABSPEC 1.014 02/15/2009 1826   PHURINE 6.5 02/15/2009 1826   GLUCOSEU NEGATIVE 02/15/2009 1826   HGBUR NEGATIVE 02/15/2009 1826   BILIRUBINUR NEGATIVE 02/15/2009 1826   KETONESUR 15 (A) 02/15/2009 1826   PROTEINUR NEGATIVE 02/15/2009 1826   UROBILINOGEN 0.2 02/15/2009 1826    NITRITE NEGATIVE 02/15/2009 1826   LEUKOCYTESUR  02/15/2009 1826    NEGATIVE MICROSCOPIC NOT DONE ON URINES WITH NEGATIVE PROTEIN, BLOOD, LEUKOCYTES, NITRITE, OR GLUCOSE <1000 mg/dL.   Sepsis Labs Recent Labs  Lab 02/04/24 1500 02/05/24 0844 02/06/24 0850  WBC 4.8 5.4 5.1   Microbiology No results found for this or any previous visit (from the past 240 hours).   Time coordinating discharge: Over 30 minutes  SIGNED:   Seena Dadds, MD  [facility] 02/06/2024, 2:39 PM Pager   If 7PM-7AM, please contact night-coverage [email or web address] Password [email or web address]

## 2024-02-06 NOTE — Discharge Instructions (Addendum)
 Follow with Primary MD Barnetta Liberty, MD in 7 days   Get CBC, CMP,  checked  by Primary MD next visit.    Activity: As tolerated with Full fall precautions use walker/cane & assistance as needed   Disposition Home    Diet: Heart Healthy    On your next visit with your primary care physician please Get Medicines reviewed and adjusted.   Please request your Prim.MD to go over all Hospital Tests and Procedure/Radiological results at the follow up, please get all Hospital records sent to your Prim MD by signing hospital release before you go home.   If you experience worsening of your admission symptoms, develop shortness of breath, life threatening emergency, suicidal or homicidal thoughts you must seek medical attention immediately by calling 911 or calling your MD immediately  if symptoms less severe.  You Must read complete instructions/literature along with all the possible adverse reactions/side effects for all the Medicines you take and that have been prescribed to you. Take any new Medicines after you have completely understood and accpet all the possible adverse reactions/side effects.   Do not drive, operating heavy machinery, perform activities at heights, swimming or participation in water activities or provide baby sitting services if your were admitted for syncope or siezures until you have seen by Primary MD or a Neurologist and advised to do so again.  Do not drive when taking Pain medications.    Do not take more than prescribed Pain, Sleep and Anxiety Medications  Special Instructions: If you have smoked or chewed Tobacco  in the last 2 yrs please stop smoking, stop any regular Alcohol  and or any Recreational drug use.  Wear Seat belts while driving.   Please note  You were cared for by a hospitalist during your hospital stay. If you have any questions about your discharge medications or the care you received while you were in the hospital after you are  discharged, you can call the unit and asked to speak with the hospitalist on call if the hospitalist that took care of you is not available. Once you are discharged, your primary care physician will handle any further medical issues. Please note that NO REFILLS for any discharge medications will be authorized once you are discharged, as it is imperative that you return to your primary care physician (or establish a relationship with a primary care physician if you do not have one) for your aftercare needs so that they can reassess your need for medications and monitor your lab values.

## 2024-02-06 NOTE — Progress Notes (Signed)
   02/06/24 1447  TOC Brief Assessment  Insurance and Status Reviewed  Patient has primary care physician Yes  Home environment has been reviewed spouse  Prior level of function: independent  Prior/Current Home Services No current home services  Social Drivers of Health Review SDOH reviewed no interventions necessary  Readmission risk has been reviewed Yes   Spoke to patient on phone. Provided choice on OP PT locations. She prefers OP PT at [facility]. Entered order and asked MD to sign. Added to AVS

## 2024-02-06 NOTE — Progress Notes (Addendum)
 STROKE TEAM PROGRESS NOTE   INTERIM HISTORY/SUBJECTIVE Patient is sitting up in bed comfortably.  No complaints.  Echocardiogram showed ejection fraction of 30 to 35% with regional wall motion abnormalities of left ventricle.  Grade 1 diastolic dysfunction.  OBJECTIVE  CBC    Component Value Date/Time   WBC 5.1 02/06/2024 0850   RBC 4.55 02/06/2024 0850   HGB 12.3 02/06/2024 0850   HGB 12.4 07/26/2018 0816   HCT 39.9 02/06/2024 0850   PLT 212 02/06/2024 0850   PLT 217 07/26/2018 0816   MCV 87.7 02/06/2024 0850   MCV 80.6 03/10/2015 1133   MCH 27.0 02/06/2024 0850   MCHC 30.8 02/06/2024 0850   RDW 15.2 02/06/2024 0850   LYMPHSABS 1.8 02/04/2024 1500   MONOABS 0.4 02/04/2024 1500   EOSABS 0.2 02/04/2024 1500   BASOSABS 0.1 02/04/2024 1500    BMET    Component Value Date/Time   NA 136 02/06/2024 0850   NA 140 09/15/2022 1337   K 3.7 02/06/2024 0850   CL 103 02/06/2024 0850   CO2 23 02/06/2024 0850   GLUCOSE 96 02/06/2024 0850   BUN 14 02/06/2024 0850   BUN 19 09/15/2022 1337   CREATININE 1.22 (H) 02/06/2024 0850   CREATININE 1.21 (H) 07/26/2018 0816   CALCIUM 8.8 (L) 02/06/2024 0850   EGFR 43.0 06/30/2023 1112   EGFR 46 (L) 09/15/2022 1337   GFRNONAA 45 (L) 02/06/2024 0850   GFRNONAA 43 (L) 07/26/2018 0816    IMAGING past 24 hours ECHOCARDIOGRAM COMPLETE Result Date: 02/06/2024    ECHOCARDIOGRAM REPORT   Patient Name:   Alyssa Cardenas Date of Exam: 02/06/2024 Medical Rec #:  [id number]       Height:       60.0 in Accession #:    [id number]      Weight:       200.0 lb Date of Birth:  1940-10-30        BSA:          1.867 m Patient Age:    82 years        BP:           124/86 mmHg Patient Gender: F               HR:           65 bpm. Exam Location:  Inpatient Procedure: 2D Echo, Cardiac Doppler and Color Doppler (Both Spectral and Color            Flow Doppler were utilized during procedure). Indications:     Stroke  History:         Patient has prior history of  Echocardiogram examinations, most                  recent 12/07/2022. Risk Factors:Hypertension.  Sonographer:     Janette Medley Referring Phys:  Laymond Priestly Diagnosing Phys: Alwin Baars IMPRESSIONS  1. The left ventricle has moderately reduced function with an estimated EF of 30-35%. The left ventricle demonstrates regional wall motion abnormalities (see scoring diagram/findings for description). Left ventricular diastolic parameters are consistent  with Grade I diastolic dysfunction (impaired relaxation). There is hypokinesis of the mid to distal anteroseptal, anterior and inferoseptal myocardium. This is unchanged compared to prior echocardiogram.  2. Right ventricular systolic function is normal. The right ventricular size is normal.  3. Left atrial size was mildly dilated.  4. The mitral valve is grossly normal. Mild mitral valve regurgitation.  5. The  aortic valve is normal in structure. There is mild calcification of the aortic valve. Aortic valve regurgitation is not visualized.  6. The inferior vena cava is normal in size with greater than 50% respiratory variability, suggesting right atrial pressure of 3 mmHg. FINDINGS  Left Ventricle: Left ventricular ejection fraction, by estimation, is 30 to 35%. The left ventricle has moderately decreased function. The left ventricle demonstrates regional wall motion abnormalities. The left ventricular internal cavity size was normal in size. There is no left ventricular hypertrophy. Left ventricular diastolic parameters are consistent with Grade I diastolic dysfunction (impaired relaxation).  LV Wall Scoring: The mid and distal anterior septum, mid inferoseptal segment, apical anterior segment, and apex are hypokinetic. Right Ventricle: The right ventricular size is normal. No increase in right ventricular wall thickness. Right ventricular systolic function is normal. Left Atrium: Left atrial size was mildly dilated. Right Atrium: Right atrial size was normal in  size. Pericardium: There is no evidence of pericardial effusion. Mitral Valve: The mitral valve is grossly normal. There is mild thickening of the mitral valve leaflet(s). Mild mitral annular calcification. Mild mitral valve regurgitation. Tricuspid Valve: The tricuspid valve is normal in structure. Tricuspid valve regurgitation is trivial. Aortic Valve: The aortic valve is normal in structure. There is mild calcification of the aortic valve. There is mild aortic valve annular calcification. Aortic valve regurgitation is not visualized. Pulmonic Valve: The pulmonic valve was not well visualized. Pulmonic valve regurgitation is trivial. Aorta: The aortic root is normal in size and structure. Venous: The inferior vena cava is normal in size with greater than 50% respiratory variability, suggesting right atrial pressure of 3 mmHg. IAS/Shunts: No atrial level shunt detected by color flow Doppler.  LEFT VENTRICLE PLAX 2D LVIDd:         5.00 cm   Diastology LVIDs:         3.50 cm   LV e' medial:    4.13 cm/s LV PW:         1.10 cm   LV E/e' medial:  16.5 LV IVS:        1.40 cm   LV e' lateral:   4.57 cm/s LVOT diam:     2.00 cm   LV E/e' lateral: 14.9 LV SV:         76 LV SV Index:   41 LVOT Area:     3.14 cm  RIGHT VENTRICLE             IVC RV S prime:     11.00 cm/s  IVC diam: 1.30 cm TAPSE (M-mode): 1.6 cm LEFT ATRIUM             Index        RIGHT ATRIUM           Index LA diam:        3.40 cm 1.82 cm/m   RA Area:     17.00 cm LA Vol (A2C):   35.9 ml 19.23 ml/m  RA Volume:   44.70 ml  23.95 ml/m LA Vol (A4C):   44.6 ml 23.89 ml/m LA Biplane Vol: 41.9 ml 22.45 ml/m  AORTIC VALVE LVOT Vmax:   124.00 cm/s LVOT Vmean:  70.200 cm/s LVOT VTI:    0.241 m  AORTA Ao Root diam: 2.60 cm Ao Asc diam:  3.40 cm MITRAL VALVE MV Area (PHT): 5.13 cm    SHUNTS MV Decel Time: 148 msec    Systemic VTI:  0.24 m MV E velocity: 68.10  cm/s  Systemic Diam: 2.00 cm MV A velocity: 93.80 cm/s MV E/A ratio:  0.73 Aditya Sabharwal  Electronically signed by Alwin Baars Signature Date/Time: 02/06/2024/1:44:22 PM    Final (Updated)     Vitals:   02/05/24 1951 02/05/24 2302 02/06/24 0000 02/06/24 0425  BP: 109/61 118/68 123/73 124/86  Pulse: 71 76 68 69  Resp: 16 16 20 18   Temp: 97.6 F (36.4 C) (!) 97.1 F (36.2 C)  98.5 F (36.9 C)  TempSrc: Oral Oral  Oral  SpO2: 96% 91% 93% 92%  Weight:      Height:         PHYSICAL EXAM General:  Alert, well-nourished, well-developed elderly African-American lady n no acute distress Psych:  Mood and affect appropriate for situation CV: Regular rate and rhythm on monitor Respiratory:  Regular, unlabored respirations on room air GI: Abdomen soft and nontender   NEURO:  Mental Status: AA&Ox3, patient is able to give clear and coherent history Speech/Language: speech is without dysarthria or aphasia.  Naming, repetition, fluency, and comprehension intact.  Cranial Nerves:  II: PERRL. Visual fields full.  III, IV, VI: EOMI. Eyelids elevate symmetrically.  V: Sensation is intact to light touch and symmetrical to face.  VII: Right facial droop  VIII: hearing intact to voice. IX, X: Palate elevates symmetrically. Phonation is normal.  ZO:[id number] shrug 5/5. XII: tongue is midline without fasciculations. Motor: RUE 4/5 with some drift LUE 5/5 RLE and LLE 5/5 Tone: is normal and bulk is normal Sensation- Intact to light touch bilaterally. Extinction absent to light touch to DSS.   Coordination: ataxia noted on the left , HKS: no ataxia in BLE.No drift.  Gait- deferred   ASSESSMENT/PLAN  [name] is a 83 y.o. female with history of HTN, HLD, CAD s/p CABG, MI,cardiomyopathy, CKD, anxiety and cancer admitted for right facial droop, slurred speech, and right side weakness .  NIH on Admission 5  Acute Ischemic Infarct: Acute infarct along the left central sulcus  Etiology:  vessel disease   Code Stroke CT head No acute abnormality. ASPECTS 10.    CTA  head & neck No large vessel occlusion.  MRI  Acute infarct along the lateral aspect of the left central sulcus, near the right facial sensory motor region. Punctate focus of susceptibility artifact in the left precentral sulcus, possibly representing a small branch vessel occlusion in the left MCA territory 2D Echo Pending  LDL 64 HgbA1c 5.7 VTE prophylaxis - SCDs aspirin 81 mg daily prior to admission, now on aspirin 81 mg daily and clopidogrel 75 mg daily for 3 weeks and then plavix alone. Therapy recommendations:  Outpatient PT/OT/ST Disposition:  Pending   CAD s/p CABG Was previous on ASA but not taking consistently due to GI reflux  Cardiomyopathy Hypertension Home meds:  Norvasc , Entresto   Stable Blood Pressure Goal: BP less than 220/110   Hyperlipidemia Home meds:  crestor  and zetia , resumed in hospital LDL 64, goal < 70 Continue statin at discharge  Dysphagia Patient has post-stroke dysphagia, SLP consulted    Diet   Diet regular Fluid consistency: Thin   Advance diet as tolerated  Other Stroke Risk Factors Obesity, Body mass index is 39.06 kg/m., BMI >/= 30 associated with increased stroke risk, recommend weight loss, diet and exercise as appropriate   Other Active Problems CKD 3a Cr 1.20, GFR 47  Hospital day # 2   I have personally obtained history,examined this patient, reviewed notes, independently viewed imaging studies, participated  in medical decision making and plan of care.ROS completed by me personally and pertinent positives fully documented  I have made any additions or clarifications directly to the above note. Agree with note above..  Recommend aspirin and Plavix for 3 weeks followed by aspirin alone and aggressive risk factor modification.  Check outpatient 30-day heart monitor for paroxysmal A-fib.  Follow-up as an outpatient stroke clinic in 2 months.  Greater than 50% time during this 35-minute visit was spent in counseling and coordination of care  about his embolic stroke and discussion with patient and care team and answering questions.  Ardella Beaver, MD Medical Director [facility] Pager: [phone] 02/06/2024 5:34 PM    To contact Stroke Continuity provider, please refer to [email or web address]. After hours, contact General Neurology

## 2024-02-06 NOTE — Progress Notes (Signed)
 Ordering 30 day monitor for stroke, to be read by Dr. Katheryne Pane

## 2024-02-09 ENCOUNTER — Ambulatory Visit: Attending: Internal Medicine

## 2024-02-09 ENCOUNTER — Telehealth: Payer: Self-pay | Admitting: Neurology

## 2024-02-09 VITALS — BP 115/76 | HR 70

## 2024-02-09 DIAGNOSIS — R278 Other lack of coordination: Secondary | ICD-10-CM | POA: Diagnosis present

## 2024-02-09 DIAGNOSIS — R2689 Other abnormalities of gait and mobility: Secondary | ICD-10-CM | POA: Insufficient documentation

## 2024-02-09 DIAGNOSIS — I639 Cerebral infarction, unspecified: Secondary | ICD-10-CM | POA: Insufficient documentation

## 2024-02-09 DIAGNOSIS — R29818 Other symptoms and signs involving the nervous system: Secondary | ICD-10-CM | POA: Insufficient documentation

## 2024-02-09 DIAGNOSIS — M6281 Muscle weakness (generalized): Secondary | ICD-10-CM | POA: Diagnosis present

## 2024-02-09 DIAGNOSIS — R208 Other disturbances of skin sensation: Secondary | ICD-10-CM | POA: Diagnosis present

## 2024-02-09 DIAGNOSIS — R293 Abnormal posture: Secondary | ICD-10-CM | POA: Diagnosis present

## 2024-02-09 NOTE — Telephone Encounter (Signed)
 According to DC summary for the pt. Pt should be set up with outpt PT.OT and ST. Order placed for the patient.

## 2024-02-09 NOTE — Telephone Encounter (Signed)
 John [facility] came in and said this patient saw Dr. Janett Medin in the hospital and he was going to put in referrals for her to see them for PT, OT and SP but they're not in her chart. Can you place these?

## 2024-02-09 NOTE — Therapy (Signed)
 OUTPATIENT PHYSICAL THERAPY NEURO EVALUATION   Patient Name: Alyssa Cardenas MRN: [id number] DOB:May 27, 1941, 83 y.o., female Today's Date: 02/09/2024   PCP: Barnetta Liberty, MD REFERRING PROVIDER: Seena Dadds, MD  END OF SESSION:  PT End of Session - 02/09/24 1049     Visit Number 1    Number of Visits 1    Authorization Type [employer] Medicare    Progress Note Due on Visit 10    PT Start Time 1058    PT Stop Time 1128    PT Time Calculation (min) 30 min    Equipment Utilized During Treatment Gait belt    Activity Tolerance Patient tolerated treatment well    Behavior During Therapy WFL for tasks assessed/performed             Past Medical History:  Diagnosis Date   Anxiety    Breast cancer (HCC)    CAD (coronary artery disease), native coronary artery    Cath 08/27/1991 Normal Left main, calcified proximal LAD, 50% stenosis proximal LAD, 90% stenosis mid LAD, occluded Diag 1, 95% stenosis proximal Diag 2, 95% stenosis proximal CFX, 95% stenosis proximal OM 1, occluded RCA;  CABG w LIMA to LAD, SVG to dx, SVG to OM1-2-circ, SVG to PDA 08/29/1991 Dr. 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  EF     Hyperlipidemia    Hypertensive heart disease     Insomnia 11/20/2021   Left bundle branch block 08/01/2018   Myocardial infarction (HCC)    Obesity (BMI 30-39.9)    Preop cardiovascular exam 08/01/2018   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   1992   Vitamin D  deficiency 03/10/2015   Past Surgical History:  Procedure Laterality Date   BREAST BIOPSY     BREAST EXCISIONAL BIOPSY Bilateral    BREAST LUMPECTOMY Left    2019 DCIS   BREAST LUMPECTOMY WITH RADIOACTIVE SEED LOCALIZATION Left 08/18/2018    Procedure: LEFT BREAST LUMPECTOMY WITH RADIOACTIVE SEED LOCALIZATION;  Surgeon: Caralyn Chandler, MD;  Location: [facility] OR;  Service: General;  Laterality: Left;   CORONARY ARTERY BYPASS GRAFT  1992   EYE SURGERY     cataract removal 2017   TONSILLECTOMY     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   Flatulence, eructation and gas pain 11/24/2021   Rectal bleeding 11/24/2021   Chronic systolic heart failure (HCC) 11/20/2021   Insomnia 11/20/2021   Myocardial infarction [facility])    Chronic kidney disease    Cancer (HCC)    Breast cancer (HCC)    Anxiety    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   Cardiomyopathy (HCC) 08/01/2018   Left bundle branch block 08/01/2018   Preop cardiovascular exam 08/01/2018   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   Chronic kidney disease, stage 3b (HCC) 03/25/2015   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   CAD (coronary artery disease), native coronary artery    Obesity (BMI 30-39.9)    History of MI (myocardial infarction) 03/10/2015   History of colonic polyps 03/10/2015   Vitamin D  deficiency 03/10/2015  Hypertensive heart disease     Hyperlipidemia    Dermatitis 08/14/2013   Essential hypertension 05/02/2012    ONSET DATE: 02/06/24 referral  REFERRING DIAG: I63.9 (ICD-10-CM) - Acute ischemic stroke (HCC)   THERAPY DIAG:  Abnormal posture - Plan: PT plan of care cert/re-cert  Muscle weakness (generalized) - Plan: PT plan of care cert/re-cert  Other abnormalities of gait and mobility - Plan: PT plan of care cert/re-cert  Rationale for Evaluation and Treatment: Rehabilitation  SUBJECTIVE:                                                                                                                                                                                             SUBJECTIVE STATEMENT: Patient arrives to clinic  with husband, who remains in lobby. This past Saturday she woke up with R sided weakness. She has a noticeable R facial droop and dysarthria. She was not using an AD today and she was not prior to the CVA. Reports greatest deficits with facial and tongue numbness.  Pt accompanied by: significant other  PERTINENT HISTORY: anxiety, Breast CA, CAD, cardiomyopathy, CKD, MI, HLD, HTN, LBBB, Vit D deficiency   PAIN:  Are you having pain? No  PRECAUTIONS: Fall and Other: mild R hemi  WEIGHT BEARING RESTRICTIONS: No  FALLS: Has patient fallen in last 6 months? No  LIVING ENVIRONMENT: Lives with: lives with their spouse Lives in: House/apartment Stairs: Yes: External: 1 steps; none Has following equipment at home: shower chair and Grab bars  PLOF: Independent  PATIENT GOALS: "to get back to where I was before"  OBJECTIVE:  Note: Objective measures were completed at Evaluation unless otherwise noted.  DIAGNOSTIC FINDINGS: IMPRESSION: 1. Acute infarct along the lateral aspect of the left central sulcus, near the right facial sensory motor region. 2. Punctate focus of susceptibility artifact in the left precentral sulcus, possibly representing a small branch vessel occlusion in the left MCA territory, not seen on the same day CTA.  COGNITION: Overall cognitive status: Within functional limits for tasks assessed   SENSATION: Mild numbness in R face  COORDINATION: WFL B LE heel shin and figure 8  POSTURE: No Significant postural limitations   LOWER EXTREMITY MMT:    MMT Right Eval Left Eval  Hip flexion 4 4  Hip extension    Hip abduction 4 4  Hip adduction 4 4  Hip internal rotation    Hip external rotation    Knee flexion 4 4  Knee extension 4 4  Ankle dorsiflexion 4 4  Ankle plantarflexion    Ankle inversion    Ankle eversion    (Blank  rows = not tested)  BED MOBILITY:  Independent   GAIT: Findings: Gait Characteristics: step through pattern, decreased arm  swing- Right, and trendelenburg, Assistive device utilized:None, and Level of assistance: Modified independence  FUNCTIONAL TESTS:   [facility] PT Assessment - 02/09/24 0001       Standardized Balance Assessment   Standardized Balance Assessment Timed Up and Go Test    Five times sit to stand comments  14s no UE    10 Meter Walk .99m/s      Timed Up and Go Test   Normal TUG (seconds) 11.4                                                                                                                                           TREATMENT  Self care/ home management: -presentation of CVA and location of CVA  -need for OT and ST to address deficits  -slowly increasing activity since d/c from hospital   PATIENT EDUCATION: Education details: PT POC, exam findings, see above Person educated: Patient Education method: Explanation Education comprehension: verbalized understanding  GOALS: Not indicated as patient does not require skilled PT services at this time  ASSESSMENT:  CLINICAL IMPRESSION: Patient is an 83 y.o. female who was seen today for physical therapy evaluation and treatment for impaired mobility s/p CVA. Her stroke mostly impacted her R UE and R facial muscles. She still has a noticeable facial droop with mild dysarthria. Denies overt difficulties with swallowing at this time. Five times Sit to Stand Test (FTSS) Method: Use a straight back chair with a solid seat that is 17-18" high. Ask participant to sit on the chair with arms folded across their chest.   Instructions: "Stand up and sit down as quickly as possible 5 times, keeping your arms folded across your chest."   Measurement: Stop timing when the participant touches the chair in sitting the 5th time.  TIME: 14 sec  Cut off scores indicative of increased fall risk: >12 sec CVA, >16 sec PD, >13 sec vestibular (ANPTA Core Set of Outcome Measures for Adults with Neurologic Conditions, 2018). 10 Meter Walk  Test: Patient instructed to walk 10 meters (32.8 ft) as quickly and as safely as possible at their normal speed x2 and at a fast speed x2. Time measured from 2 meter mark to 8 meter mark to accommodate ramp-up and ramp-down.  Normal speed: .77m/s Cut off scores: <0.4 m/s = household Ambulator, 0.4-0.8 m/s = limited community Ambulator, >0.8 m/s = community Ambulator, >1.2 m/s = crossing a street, <1.0 = increased fall risk MCID 0.05 m/s (small), 0.13 m/s (moderate), 0.06 m/s (significant)  (ANPTA Core Set of Outcome Measures for Adults with Neurologic Conditions, 2018). Patient completed the Timed Up and Go test (TUG) in 11.4 seconds.  Geriatrics: need for further assessment of fall risk: >/= 12 sec; Recurrent falls: > 15 sec; Vestibular Disorders fall  risk: > 15 sec; Parkinson's Disease fall risk: > 16 sec ([email or web address], 2023). Patient scored at her baseline for all functional outcome measures. She does not require skilled PT services at this time. Patient verbalized understanding.    Aaron Aas    CLINICAL DECISION MAKING: Stable/uncomplicated  EVALUATION COMPLEXITY: Low  PLAN:  PT FREQUENCY: one time visit  PT DURATION: other: 1x visit   Rebecca Campus, PT Rebecca Campus, PT, DPT, CBIS  02/09/2024, 11:42 AM

## 2024-02-09 NOTE — Addendum Note (Signed)
 Addended by: Elton Ham on: 02/09/2024 09:13 AM   Modules accepted: Orders

## 2024-02-28 ENCOUNTER — Ambulatory Visit: Admitting: Occupational Therapy

## 2024-02-28 DIAGNOSIS — R278 Other lack of coordination: Secondary | ICD-10-CM

## 2024-02-28 DIAGNOSIS — R208 Other disturbances of skin sensation: Secondary | ICD-10-CM

## 2024-02-28 DIAGNOSIS — R293 Abnormal posture: Secondary | ICD-10-CM | POA: Diagnosis not present

## 2024-02-28 DIAGNOSIS — R29818 Other symptoms and signs involving the nervous system: Secondary | ICD-10-CM

## 2024-02-28 DIAGNOSIS — M6281 Muscle weakness (generalized): Secondary | ICD-10-CM

## 2024-02-28 NOTE — Therapy (Signed)
 OUTPATIENT OCCUPATIONAL THERAPY NEURO EVALUATION  Patient Name: Alyssa Cardenas MRN: [id number] DOB:April 21, 1941, 83 y.o., female Today's Date: 02/28/2024  PCP: Larnell Hamilton, MD REFERRING PROVIDER: Rosemarie Eather RAMAN, MD  END OF SESSION:  OT End of Session - 02/28/24 0934     Visit Number 1    Number of Visits 6    Date for OT Re-Evaluation 04/05/24    Authorization Type [employer] Medicare    Authorization Time Period Auth Required VL: MN    OT Start Time 0931    OT Stop Time 1015    OT Time Calculation (min) 44 min    Equipment Utilized During Treatment Testing Material    Activity Tolerance Patient tolerated treatment well    Behavior During Therapy WFL for tasks assessed/performed          Past Medical History:  Diagnosis Date   Anxiety    Breast cancer (HCC)    CAD (coronary artery disease), native coronary artery    Cath 08/27/1991 Normal Left main, calcified proximal LAD, 50% stenosis proximal LAD, 90% stenosis mid LAD, occluded Diag 1, 95% stenosis proximal Diag 2, 95% stenosis proximal CFX, 95% stenosis proximal OM 1, occluded RCA;  CABG w LIMA to LAD, SVG to dx, SVG to OM1-2-circ, SVG to PDA 08/29/1991 Dr. 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   1992   Vitamin D  deficiency 03/10/2015   Past Surgical History:  Procedure Laterality Date   BREAST BIOPSY     BREAST EXCISIONAL BIOPSY Bilateral    BREAST LUMPECTOMY Left    2019 DCIS   BREAST  LUMPECTOMY WITH RADIOACTIVE SEED LOCALIZATION Left 08/18/2018   Procedure: LEFT BREAST LUMPECTOMY WITH RADIOACTIVE SEED LOCALIZATION;  Surgeon: Curvin Deward MOULD, MD;  Location: [facility] OR;  Service: General;  Laterality: Left;   CORONARY ARTERY BYPASS GRAFT  1992   EYE SURGERY     cataract removal 2017   TONSILLECTOMY     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   Flatulence, eructation and gas pain 11/24/2021   Rectal bleeding 11/24/2021   Chronic systolic heart failure (HCC) 11/20/2021   Insomnia 11/20/2021   Myocardial infarction [facility])    Chronic kidney disease    Cancer (HCC)    Breast cancer (HCC)    Anxiety    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   Cardiomyopathy (HCC) 08/01/2018   Left bundle branch block 08/01/2018   Preop cardiovascular exam 08/01/2018   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   Chronic kidney disease, stage 3b (HCC) 03/25/2015   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   CAD (coronary artery disease), native coronary artery    Obesity (BMI 30-39.9)    History of MI (myocardial infarction) 03/10/2015   History of colonic polyps 03/10/2015  Vitamin D  deficiency 03/10/2015   Hypertensive heart disease     Hyperlipidemia    Dermatitis 08/14/2013   Essential hypertension 05/02/2012    ONSET DATE: 02/09/2024  REFERRING DIAG: I63.9 (ICD-10-CM) - Acute ischemic stroke  THERAPY DIAG:  Muscle weakness (generalized)  Other lack of coordination  Other disturbances of skin sensation  Other symptoms and signs involving the nervous system  Rationale for Evaluation and Treatment: Rehabilitation  SUBJECTIVE:   SUBJECTIVE STATEMENT: Pt reports her R hand and speech are getting better and that PT did not recommend any therapy.  Although she is getting better', she still wants to get back to where she was.    Pt says she woke up with R  sided weakness ie)  R facial droop and dysarthria as well as R arm weakness.  She reports greatest deficits with facial and tongue numbness.   Pt says she has been practicing swallowing and making a fist at home and trying to do regular activities ie) washing dishes, making bed, laundry etc.  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  Pt accompanied by: significant other - husband waiting in lobby and drove her  PERTINENT HISTORY:  Patient presents to ED secondary to complaint of numbness, right upper extremity weakness, right facial droop and slurred speech, MRI brain significant for acute CVA - acute infarct of left central stenosis, admitted for further workup with DC recommending outpt OT/PT/ST  PMHx: anxiety, Breast CA, CAD, cardiomyopathy, s/p CABG, CKD, MI, HLD, HTN, LBBB, Vit D deficiency   PRECAUTIONS: Other: heart monitor in place until July 6th  WEIGHT BEARING RESTRICTIONS: No  PAIN:  Are you having pain? No  FALLS: Has patient fallen in last 6 months? No - Last fall was in 2024 - stepping out of the shower and fell to her knees  LIVING ENVIRONMENT: Lives with: lives with their spouse Lives in: House Stairs: Yes: External: 1 step to deck at back door; none Has following equipment at home: shower chair built into walk in shower and Grab bar on back wall and one to step into shower  PLOF: Independent and still drove  PATIENT GOALS: Get back where I was before  OBJECTIVE:  Note: Objective measures were completed at Evaluation unless otherwise noted.  HAND DOMINANCE: Right  ADLs: Overall ADLs: Mostly independent, has not had as much energy as before Transfers/ambulation related to ADLs: Ind w/o AE Eating: Ind Grooming: Ind although overhead reaching is tiresome UB Dressing: Ind LB Dressing: Ind Toileting: Ind Bathing: Ind Tub Shower transfers: Mod Ind Equipment: Grab bars, Walk in shower, and Built in shower  seat  IADLs: Shopping: Hasn't been recently, son orders online and has it delivered Light housekeeping: Husband helping, pt reports she was dropping things Meal Prep: nothing elaborate, using stove and microwave Community mobility: hasn't been getting out a lot since she's been home Medication management: Uses a pill box and sorts her medication Financial management: Pt does her own bills and husband does his Handwriting: 90% legible - less flourishes etc.  Past hobbies - Crochet/knit, painted landscapes and ballet dancers   MOBILITY STATUS: Independent  POSTURE COMMENTS:  rounded shoulders and forward head Sitting balance: WFL  ACTIVITY TOLERANCE: Activity tolerance: Less than before, trying to work up endurance  FUNCTIONAL OUTCOME MEASURES: Quick Dash: 22.7  UPPER EXTREMITY ROM:    Active ROM Right eval Left eval  Shoulder flexion <100 <100  Shoulder abduction    Shoulder adduction  Shoulder extension    Shoulder internal rotation    Shoulder external rotation    Elbow flexion WFL WNL  Elbow extension    Wrist flexion    Wrist extension    Wrist ulnar deviation    Wrist radial deviation    Wrist pronation    Wrist supination    (Blank rows = not tested)  UPPER EXTREMITY MMT:     MMT Right eval Left eval  Shoulder flexion 4 4  Shoulder abduction    Shoulder adduction    Shoulder extension    Shoulder internal rotation    Shoulder external rotation    Middle trapezius    Lower trapezius    Elbow flexion 5 5  Elbow extension    Wrist flexion    Wrist extension    Wrist ulnar deviation    Wrist radial deviation    Wrist pronation    Wrist supination    (Blank rows = not tested)  HAND FUNCTION: Grip strength: Right: 18.5, 15.8, 19.4 lbs; Left: 28.2, 26.4, 27.9 lbs Average: Right: 17.9 lbs, Left 27.5 lbs  COORDINATION: 9 Hole Peg test: Right: 25.36 sec; Left: 25.00 sec Box and Blocks:  Right 45 blocks, Left 43 blocks  SENSATION: Light touch:  Impaired   EDEMA: NA  MUSCLE TONE: Generally WFL  COGNITION: Overall cognitive status: Within functional limits for tasks assessed  VISION: Subjective report: Ocular Auras/Migraines Baseline vision: TBA Visual history: TBA  VISION ASSESSMENT: Not tested  Patient has difficulty with following activities due to following visual impairments: TBA  OBSERVATIONS: Pt ambulates with no AE and no loss of balance. Slight changes noted in her speech with ST eval already scheduled.                                                                                                                            TREATMENT DATE: 02/28/24   Initiated pt specific OT education on rehabilitation process, results of objective measures in relation to pt specific goals and general HEP ideas for coordination due to affected extremity being her dominant RUE. Pt in agreement with short duration of OT services to address strength, coordination, endurance and sensory changes.  PATIENT EDUCATION: Education details: OT role, POC considerations and HEP ideas for UE coordination. Person educated: Patient Education method: Explanation Education comprehension: verbalized understanding, verbal cues required, and needs further education  HOME EXERCISE PROGRAM: TBD   GOALS: Goals reviewed with patient? Yes  SHORT TERM GOALS: Target date:   Patient will demonstrate initial R UE coordination and putty HEP with 25% verbal cues or less for proper execution. Baseline: New to outpt OT Goal status: INITIAL    2.  Pt will recall the 5 main sensory precautions (cold, heat, sharp/breakable, chemical, and heavy) as needed to prevent injury/harm secondary to impairments.  Baseline: New to outpt OT  Goal status: INITIAL   3. Pt will verbalize understanding of adapted strategies and/or equipment PRN to increase safety  and independence with ADLs and IADLs (I.e. with decreased pain and joint protection).             Baseline:  New to outpt OT with dominant RUE affected by stroke and arthritis             Goal Status: INITIAL  4.  Patient will demonstrate >20 lbs RUE grip strength as needed to open jars and other containers. Baseline: Right: 17.9 lbs, Left 27.5 lbs Goal status: INITIAL   LONG TERM GOALS: Target date:    Patient will demonstrate updated UE HEP with visual handouts only for proper execution.  Baseline: New to outpt OT  Goal status: INITIAL   2.  Patient will demo improved FM coordination as evidenced by completing painting activities per prior hobby with minimal difficulty.  Baseline: New to outpt OT and not engaged in hobbies Goal status: INITIAL   3.  Pt will write a list, card or message with no significant change in her font and maintain >90% legibility with increased self reports of ease. Baseline: Pt finds writing not as easy and her font is slightly less flourished as prior to CVA Goal status: INITIAL    4. Pt will be aware/use sensory stimulation activities to promote improved sensory awareness of R/L hand for stereognosis and fine motor tasks.  Baseline: Sensation is different, dull and less accurate on R side since stroke Goal status: INITIAL   ASSESSMENT:  CLINICAL IMPRESSION: Patient is a 83 y.o. female who was seen today for occupational therapy evaluation for dominant RUE deficits s/p CVA. Hx includes anxiety, breast CA, CAD, cardiomyopathy, s/p CABG, CKD, MI, HLD, HTN, LBBB, Vit D deficiency. Patient currently presents slightly below baseline level of function with RUE with some questionable sensory deficits and slight limitations in strength and coordination. Pt would benefit from skilled OT services in the outpatient setting to work on impairments as noted below to help pt return to PLOF as able.   PERFORMANCE DEFICITS: in functional skills including ADLs, IADLs, coordination, dexterity, proprioception, sensation, ROM, strength, flexibility, Fine motor control, Gross motor  control, balance, decreased knowledge of precautions, decreased knowledge of use of DME, and vision, cognitive skills including safety awareness, and psychosocial skills including coping strategies, environmental adaptation, and routines and behaviors.   IMPAIRMENTS: are limiting patient from ADLs, leisure, and social participation.   CO-MORBIDITIES: may have co-morbidities  that affects occupational performance. Patient will benefit from skilled OT to address above impairments and improve overall function.  MODIFICATION OR ASSISTANCE TO COMPLETE EVALUATION: Min-Moderate modification of tasks or assist with assess necessary to complete an evaluation.  OT OCCUPATIONAL PROFILE AND HISTORY: Detailed assessment: Review of records and additional review of physical, cognitive, psychosocial history related to current functional performance.  CLINICAL DECISION MAKING: Moderate - several treatment options, min-mod task modification necessary  REHAB POTENTIAL: Excellent  EVALUATION COMPLEXITY: Moderate    PLAN:  OT FREQUENCY: 1x/week  OT DURATION: 6 weeks  PLANNED INTERVENTIONS: 97535 self care/ADL training, 02889 therapeutic exercise, 97530 therapeutic activity, 97112 neuromuscular re-education, 97039 fluidotherapy, 97760 Orthotic Initial, [id number] Orthotic/Prosthetic subsequent, balance training, visual/perceptual remediation/compensation, energy conservation, coping strategies training, patient/family education, and DME and/or AE instructions  RECOMMENDED OTHER SERVICES: ST evaluation already scheduled  CONSULTED AND AGREED WITH PLAN OF CARE: Patient  PLAN FOR NEXT SESSION:  Assess Vision for objective data Further sensory assessment, stereognosis Sensory precautions Coordination and Putty HEP   Clarita LITTIE Pride, OT 02/28/2024, 8:11 PM

## 2024-03-06 ENCOUNTER — Ambulatory Visit: Attending: Neurology | Admitting: Speech Pathology

## 2024-03-06 ENCOUNTER — Ambulatory Visit: Admitting: Occupational Therapy

## 2024-03-06 ENCOUNTER — Encounter: Payer: Self-pay | Admitting: Speech Pathology

## 2024-03-06 DIAGNOSIS — R1310 Dysphagia, unspecified: Secondary | ICD-10-CM | POA: Diagnosis present

## 2024-03-06 DIAGNOSIS — R208 Other disturbances of skin sensation: Secondary | ICD-10-CM | POA: Diagnosis present

## 2024-03-06 DIAGNOSIS — M6281 Muscle weakness (generalized): Secondary | ICD-10-CM

## 2024-03-06 DIAGNOSIS — R29818 Other symptoms and signs involving the nervous system: Secondary | ICD-10-CM

## 2024-03-06 DIAGNOSIS — R471 Dysarthria and anarthria: Secondary | ICD-10-CM | POA: Diagnosis present

## 2024-03-06 DIAGNOSIS — R278 Other lack of coordination: Secondary | ICD-10-CM | POA: Insufficient documentation

## 2024-03-06 DIAGNOSIS — I639 Cerebral infarction, unspecified: Secondary | ICD-10-CM | POA: Diagnosis not present

## 2024-03-06 NOTE — Patient Instructions (Addendum)
 Access Code: [email or web address] URL: [email or web address] Date: 03/06/2024 Prepared by: Clarita Pride  Exercises - Putty Squeezes  - 1-2 x daily - 10 reps - Rolling Putty on Table  - 1-2 x daily - 10 reps - Finger Pinch and Pull with Putty  - 1-2 x daily - 10 reps - 3-Point Pinch with Putty  - 1-2 x daily - 10 reps - Tip PUSH with Putty  - 1-2 x daily - 10 reps - Key Pinch with Putty  - 1-2 x daily - 10 reps - Finger Extension with Putty  - 1-2 x daily - 10 reps - Finger Adduction with Putty  - 1-2 x daily - 10 reps - Removing Marbles from Putty  - 1-2 x daily - 10 reps

## 2024-03-06 NOTE — Therapy (Signed)
 OUTPATIENT SPEECH LANGUAGE PATHOLOGY MOTOR SPEECH EVALUATION   Patient Name: Alyssa Cardenas MRN: [id number] DOB:1941/01/03, 83 y.o., female Today's Date: 03/06/2024  PCP: Dr. Ellin REFERRING PROVIDER: Dr. Rosemarie  END OF SESSION:  End of Session - 03/06/24 0844     Visit Number 1    Number of Visits 1    SLP Start Time 0800    SLP Stop Time  0841    SLP Time Calculation (min) 41 min    Activity Tolerance Patient tolerated treatment well          Past Medical History:  Diagnosis Date   Anxiety    Breast cancer (HCC)    CAD (coronary artery disease), native coronary artery    Cath 08/27/1991 Normal Left main, calcified proximal LAD, 50% stenosis proximal LAD, 90% stenosis mid LAD, occluded Diag 1, 95% stenosis proximal Diag 2, 95% stenosis proximal CFX, 95% stenosis proximal OM 1, occluded RCA;  CABG w LIMA to LAD, SVG to dx, SVG to OM1-2-circ, SVG to PDA 08/29/1991 Dr. 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   1992   Vitamin D  deficiency 03/10/2015   Past Surgical History:  Procedure Laterality Date   BREAST BIOPSY     BREAST EXCISIONAL BIOPSY Bilateral    BREAST LUMPECTOMY Left    2019 DCIS   BREAST LUMPECTOMY WITH RADIOACTIVE SEED LOCALIZATION Left 08/18/2018   Procedure: LEFT BREAST LUMPECTOMY WITH RADIOACTIVE SEED LOCALIZATION;  Surgeon: Curvin Deward MOULD, MD;  Location: [facility] OR;  Service: General;  Laterality: Left;   CORONARY ARTERY BYPASS GRAFT   1992   EYE SURGERY     cataract removal 2017   TONSILLECTOMY     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   Flatulence, eructation and gas pain 11/24/2021   Rectal bleeding 11/24/2021   Chronic systolic heart failure (HCC) 11/20/2021   Insomnia 11/20/2021   Myocardial infarction [facility])    Chronic kidney disease    Cancer (HCC)    Breast cancer (HCC)    Anxiety    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   Cardiomyopathy (HCC) 08/01/2018   Left bundle branch block 08/01/2018   Preop cardiovascular exam 08/01/2018   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  Rationale for Evaluation and Treatment: Rehabilitation  SUBJECTIVE:   SUBJECTIVE STATEMENT: From time to time I bite my tongue  Pt accompanied by: self  PERTINENT HISTORY: 83 y.o. female with history of HTN, HLD, CAD s/p CABG, MI,cardiomyopathy, CKD, anxiety and cancer admitted for right facial droop, slurred speech, and right side weakness .  NIH on Admission 5. Acute infarct along L central sulcus.   PAIN:  Are you having pain? No  FALLS: Has patient fallen in last 6 months?  See PT evaluation for details  LIVING ENVIRONMENT: Lives with: lives with their spouse Lives in: House/apartment  PLOF:  Level of assistance: Independent with ADLs,  Independent with IADLs Employment: Retired  PATIENT GOALS: none stated   OBJECTIVE:  Note: Objective measures were completed at Evaluation unless otherwise noted.  DIAGNOSTIC FINDINGS: MRI  Acute infarct along the lateral aspect of the left central sulcus, near the right facial sensory motor region. Punctate focus of susceptibility artifact in the left precentral sulcus, possibly representing a small branch vessel occlusion in the left MCA territory  COGNITION: Overall cognitive status: Within functional limits for tasks assessed  ORAL MOTOR EXAMINATION: Overall status: WFL Comments: mild R facial droop at rest. Equal facial movement with retraction via smile.   MOTOR SPEECH EVALUATION:  assessed across variety of speech tasks: reading, word repetition, generative discourse sample Overall motor speech: Appears intact Rate of Speech: WFL Dysfluencies: none evidenced Phonation: normal  Sustained phonation duration: 12 seconds Conversational loudness average: 69 dB Voice Quality: normal  s/z ratio:  Respiration: thoracic breathing Word and Phrasal Stress: WFL Resonance: WFL Articulation: Appears intact Diadochokinetic Rate (DDK): unremarkable Intelligibility: Intelligible Motor planning: Appears intact  PATIENT REPORTED OUTCOME MEASURES (PROM): Communication Effectiveness Survey        How effective is your speech... Pt Rating  Having a conversation with a family member or friends at home 4  Participating in conversation with strangers in a quiet place  3  Conversing with a familiar person over the telephone 4  Conversing with a stranger over the telephone 4  Being part of a conversation in a noisy environment  4  Speaking to a friend when you are emotionally upset or angry 4  Having a conversation while traveling in the car 4  Having a conversation with someone at a distance 4                                                                                                Total:  31   1= not at all effective                                                                      4= very effective   CLINICAL SWALLOW EVALUATION:  SUBJECTIVE DYSPHAGIA REPORTS:  Reported symptoms: saliva  management   Current diet: regular and thin liquids  Co-morbid voice changes: Yes, per pt mildly lower   FACTORS WHICH MAY INCREASE RISK OF ADVERSE EVENT IN PRESENCE OF ASPIRATION:  General health: well appearing  Risk factors: none evident     CLINICAL SWALLOW ASSESSMENT:   Dentition: adequate natural dentition Vocal quality at baseline: normal Patient directly observed with POs: Yes: regular and thin liquids  Feeding: able to feed self Liquids provided by: cup [facility] Swallow Protocol: Pass Oral phase signs and symptoms: anterior loss/spillage (reportedly for saliva, not observed this date)  Pharyngeal phase signs and symptoms: no overt s/sx aspiration   PATIENT REPORTED OUTCOME MEASURES (PROM): Question Patient's Response  My swallowing problem has caused me to lose weight 0  2.  My swallowing problem interferes with my ability to go out to meals 0  3.  Swallowing liquids takes extra effort 0  4.  Swallowing solids takes extra effort 0  5.  Swallowing pills takes extra effort 0  6.  Swallowing is painful 0  7.  The pleasure of eating is affected by my swallowing 0  8.  When I swallow food sticks in my throat 0  9.  I cough when I eat 0  10.  Swallowing is stressful  0  0= No problem 4= Severe problem                                                                                                                           TREATMENT DATE:  03/06/2024: Education and training provided for saliva management strategies with pt evidencing understanding via teach back and demonstration. Reviewed aspiration precautions.   PATIENT EDUCATION: Education details: results and recommendations Person educated: Patient Education method: Medical illustrator Education  comprehension: verbalized understanding and returned demonstration  HOME EXERCISE PROGRAM: Not indicated    GOALS: Goals reviewed with patient? Yes  ASSESSMENT:  CLINICAL IMPRESSION: Patient is a 83 y.o. F who was seen today for dysaphia and dysarthria evaluation s/p CVA. Pt presents with intact motor speech and swallowing which does not warrant further skilled interventions. Per pt report, her speech is approximating 100% of baseline and she denies any challenges with functional communication. Speech is fluent, clear and 100% intelligible to unfamiliar listener. Pt denies residual swallow deficits and has returned to consumption of regular diet with thin liquids, c/w baseline. Passed 3 oz water challenge indicating low likelihood of aspiration. Pt is agreeable to no ST.    PLAN:  SLP FREQUENCY:  eval and discharge    Harlene LITTIE Ned, CCC-SLP 03/06/2024, 8:44 AM

## 2024-03-06 NOTE — Therapy (Signed)
 OUTPATIENT OCCUPATIONAL THERAPY NEURO TREATMENT  Patient Name: Alyssa Cardenas MRN: [id number] DOB:05/19/1941, 83 y.o., female Today's Date: 03/06/2024  PCP: Larnell Hamilton, MD REFERRING PROVIDER: Rosemarie Eather RAMAN, MD  END OF SESSION:  OT End of Session - 03/06/24 9171     Visit Number 2    Number of Visits 6    Date for OT Re-Evaluation 04/05/24    Authorization Type [employer] Medicare    Authorization Time Period Auth Required VL: MN    OT Start Time 0845    OT Stop Time 0930    OT Time Calculation (min) 45 min    Equipment Utilized During Treatment Pink Putty    Activity Tolerance Patient tolerated treatment well    Behavior During Therapy WFL for tasks assessed/performed          Past Medical History:  Diagnosis Date   Anxiety    Breast cancer (HCC)    CAD (coronary artery disease), native coronary artery    Cath 08/27/1991 Normal Left main, calcified proximal LAD, 50% stenosis proximal LAD, 90% stenosis mid LAD, occluded Diag 1, 95% stenosis proximal Diag 2, 95% stenosis proximal CFX, 95% stenosis proximal OM 1, occluded RCA;  CABG w LIMA to LAD, SVG to dx, SVG to OM1-2-circ, SVG to PDA 08/29/1991 Dr. 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   History of colonic polyps 03/10/2015   History of MI (myocardial infarction) 03/10/2015   Old ASMI in 1992 prior to CABG  EF     Hyperlipidemia    Hypertensive heart disease     Insomnia 11/20/2021   Left bundle branch block 08/01/2018   Myocardial infarction (HCC)    Obesity (BMI 30-39.9)    Preop cardiovascular exam 08/01/2018   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   1992   Vitamin D  deficiency 03/10/2015   Past Surgical History:  Procedure Laterality Date   BREAST BIOPSY     BREAST EXCISIONAL BIOPSY Bilateral    BREAST LUMPECTOMY Left    2019 DCIS   BREAST LUMPECTOMY  WITH RADIOACTIVE SEED LOCALIZATION Left 08/18/2018   Procedure: LEFT BREAST LUMPECTOMY WITH RADIOACTIVE SEED LOCALIZATION;  Surgeon: Curvin Deward MOULD, MD;  Location: [facility] OR;  Service: General;  Laterality: Left;   CORONARY ARTERY BYPASS GRAFT  1992   EYE SURGERY     cataract removal 2017   TONSILLECTOMY     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   Flatulence, eructation and gas pain 11/24/2021   Rectal bleeding 11/24/2021   Chronic systolic heart failure (HCC) 11/20/2021   Insomnia 11/20/2021   Myocardial infarction [facility])    Chronic kidney disease    Cancer (HCC)    Breast cancer (HCC)    Anxiety    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   Cardiomyopathy (HCC) 08/01/2018   Left bundle branch block 08/01/2018   Preop cardiovascular exam 08/01/2018   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   Chronic kidney disease, stage 3b (HCC) 03/25/2015   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   CAD (coronary artery disease), native coronary artery    Obesity (BMI 30-39.9)    History of MI (myocardial infarction) 03/10/2015   History of colonic polyps 03/10/2015  Vitamin D  deficiency 03/10/2015   Hypertensive heart disease     Hyperlipidemia    Dermatitis 08/14/2013   Essential hypertension 05/02/2012    ONSET DATE: 02/09/2024  REFERRING DIAG: I63.9 (ICD-10-CM) - Acute ischemic stroke  THERAPY DIAG:  Muscle weakness (generalized)  Other lack of coordination  Other disturbances of skin sensation  Other symptoms and signs involving the nervous system  Rationale for Evaluation and Treatment: Rehabilitation  SUBJECTIVE:   SUBJECTIVE STATEMENT: Pt reported that her speech has continued to improve and that the ST eval prior to OT visit today did not recommend any further follow up at this time.   Pt accompanied by: significant other - husband waiting in lobby  and drove her  PERTINENT HISTORY:  Patient presents to ED secondary to complaint of numbness, right upper extremity weakness, right facial droop and slurred speech, MRI brain significant for acute CVA - acute infarct of left central stenosis, admitted for further workup with DC recommending outpt OT/PT/ST  PMHx: anxiety, Breast CA, CAD, cardiomyopathy, s/p CABG, CKD, MI, HLD, HTN, LBBB, Vit D deficiency   PRECAUTIONS: Other: heart monitor in place until July 6th  WEIGHT BEARING RESTRICTIONS: No  PAIN:  Are you having pain? No  FALLS: Has patient fallen in last 6 months? No - Last fall was in 2024 - stepping out of the shower and fell to her knees  LIVING ENVIRONMENT: Lives with: lives with their spouse Lives in: House Stairs: Yes: External: 1 step to deck at back door; none Has following equipment at home: shower chair built into walk in shower and Grab bar on back wall and one to step into shower  PLOF: Independent and still drove  PATIENT GOALS: Get back where I was before  OBJECTIVE:  Note: Objective measures were completed at Evaluation unless otherwise noted.  HAND DOMINANCE: Right  ADLs: Overall ADLs: Mostly independent, has not had as much energy as before Transfers/ambulation related to ADLs: Ind w/o AE Eating: Ind Grooming: Ind although overhead reaching is tiresome UB Dressing: Ind LB Dressing: Ind Toileting: Ind Bathing: Ind Tub Shower transfers: Mod Ind Equipment: Grab bars, Walk in shower, and Built in shower seat  IADLs: Shopping: Hasn't been recently, son orders online and has it delivered Light housekeeping: Husband helping, pt reports she was dropping things Meal Prep: nothing elaborate, using stove and microwave Community mobility: hasn't been getting out a lot since she's been home Medication management: Uses a pill box and sorts her medication Financial management: Pt does her own bills and husband does his Handwriting: 90% legible - less  flourishes etc.  Past hobbies - Crochet/knit, painted landscapes and ballet dancers   MOBILITY STATUS: Independent  POSTURE COMMENTS:  rounded shoulders and forward head Sitting balance: WFL  ACTIVITY TOLERANCE: Activity tolerance: Less than before, trying to work up endurance  FUNCTIONAL OUTCOME MEASURES: Quick Dash: 22.7  UPPER EXTREMITY ROM:    Active ROM Right eval Left eval  Shoulder flexion <100 <100  Shoulder abduction    Shoulder adduction    Shoulder extension    Shoulder internal rotation    Shoulder external rotation    Elbow flexion WFL WNL  Elbow extension    Wrist flexion    Wrist extension    Wrist ulnar deviation    Wrist radial deviation    Wrist pronation    Wrist supination    (Blank rows = not tested)  UPPER EXTREMITY MMT:     MMT Right eval Left eval  Shoulder flexion  4 4  Shoulder abduction    Shoulder adduction    Shoulder extension    Shoulder internal rotation    Shoulder external rotation    Middle trapezius    Lower trapezius    Elbow flexion 5 5  Elbow extension    Wrist flexion    Wrist extension    Wrist ulnar deviation    Wrist radial deviation    Wrist pronation    Wrist supination    (Blank rows = not tested)  HAND FUNCTION: Grip strength: Right: 18.5, 15.8, 19.4 lbs; Left: 28.2, 26.4, 27.9 lbs Average: Right: 17.9 lbs, Left 27.5 lbs  COORDINATION: 9 Hole Peg test: Right: 25.36 sec; Left: 25.00 sec Box and Blocks:  Right 45 blocks, Left 43 blocks  SENSATION: Light touch: Impaired   EDEMA: NA  MUSCLE TONE: Generally WFL  COGNITION: Overall cognitive status: Within functional limits for tasks assessed  VISION: Subjective report: Ocular Auras/Migraines Baseline vision: TBA Visual history: TBA Pt reports getting bright, spiky, flashing lights about 1 year ago and would have to sit in the dark and even cover her head to get it real dark.  She notes sometimes her eyes get tired but feels like she sees fine  all around.  VISION ASSESSMENT: Not tested Tracking/Visual pursuits: Decreased smoothness with horizontal tracking especially with movement to Left side as well as decreased peripheral vision which appeared to be greater on the L than R but still decreased on both sides ~ 45*  Patient has difficulty with following activities due to following visual impairments: TBA  OBSERVATIONS: Pt ambulates with no AE and no loss of balance. Slight changes noted in her speech with ST eval already scheduled.                                                                                                                            TREATMENT DATE: 03/06/24   Initiated Putty Exercises with red putty to begin strengthening, coordination and sensory stimulation of B UEs.  Patient provided visual demonstration, verbal and tactile cues as needed to improve performance of the various exercises/activities including:   - Putty Squeezes - cues to squeeze putty into log for use with other exercises and to fold putty in half with 1 hand  - Putty Rolls - encourage to roll putty into logs with sensory stimulation to entire length of hand, fingers and wrist as needed   - Pinch and Pull with Putty - this motion is combined with different pinches (3-Point Pinch, Tip Pinch, Key Pinch) - patient encouraged to combine tripod, pincer and/or key pinch with pinch and pull motion of putty pulling away from midline, changing between different pinches and changing different directions to change grip  - Finger Extension with Putty - pt shown how to work on task with all fingers and thumb as well as individual fingers in opposition to thumb  - Finger Adduction with Putty - pt shown how to work on  weaving putty between fingers/thumb and then squeeze fingers together while laying hand flat on table top.  - Removing Objects from Putty  - encouraged to hide items (coins, marble, dice etc) and use one hand at a time to find the objects and  identify them by tactile input before s/he digs them out and can see them visually.  OT educated patient on theraputty recommendations: avoid hot environments, place in designated container, avoid contact with fabrics. Patient verbalized understanding.    Patient benefited from extra time, verbal/tactile cues, and modeling of task to allow time for processing of verbal instructions and improve motor planning of unfamiliar movements.   PATIENT EDUCATION: Education details: Putty Activities Person educated: Patient Education method: Programmer, multimedia, Demonstration, Verbal cues, and Handouts Education comprehension: verbalized understanding, verbal cues required, and needs further education  HOME EXERCISE PROGRAM: 03/06/24: Putty Activities    GOALS: Goals reviewed with patient? Yes  SHORT TERM GOALS: Target date: 03/16/24  Patient will demonstrate initial R UE coordination and putty HEP with 25% verbal cues or less for proper execution. Baseline: New to outpt OT Goal status: IN Progress    2.  Pt will recall the 5 main sensory precautions (cold, heat, sharp/breakable, chemical, and heavy) as needed to prevent injury/harm secondary to impairments.  Baseline: New to outpt OT  Goal status: INITIAL   3. Pt will verbalize understanding of adapted strategies and/or equipment PRN to increase safety and independence with ADLs and IADLs (I.e. with decreased pain and joint protection).             Baseline: New to outpt OT with dominant RUE affected by stroke and arthritis             Goal Status: INITIAL  4.  Patient will demonstrate >20 lbs RUE grip strength as needed to open jars and other containers. Baseline: Right: 17.9 lbs, Left 27.5 lbs Goal status: IN Progress   LONG TERM GOALS: Target date: 04/05/24   Patient will demonstrate updated UE HEP with visual handouts only for proper execution.  Baseline: New to outpt OT  Goal status: IN Progress   2.  Patient will demo improved FM  coordination as evidenced by completing painting activities per prior hobby with minimal difficulty.  Baseline: New to outpt OT and not engaged in hobbies Goal status: IN Progress   3.  Pt will write a list, card or message with no significant change in her font and maintain >90% legibility with increased self reports of ease. Baseline: Pt finds writing not as easy and her font is slightly less flourished as prior to CVA Goal status: INITIAL    4. Pt will be aware/use sensory stimulation activities to promote improved sensory awareness of R/L hand for stereognosis and fine motor tasks.  Baseline: Sensation is different, dull and less accurate on R side since stroke Goal status: INITIAL   ASSESSMENT:  CLINICAL IMPRESSION: Patient is a 83 y.o. female who was seen today for occupational therapy treatment for dominant RUE deficits s/p CVA. Patient responded well to BUE activities with putty for strength, coordination and sensory stimulation.  Pt will benefit from continued skilled OT services in the outpatient setting to work on impairments as noted at evaluation to help pt return to highest level of function as able.    PERFORMANCE DEFICITS: in functional skills including ADLs, IADLs, coordination, dexterity, proprioception, sensation, ROM, strength, flexibility, Fine motor control, Gross motor control, balance, decreased knowledge of precautions, decreased knowledge of use of DME, and  vision, cognitive skills including safety awareness, and psychosocial skills including coping strategies, environmental adaptation, and routines and behaviors.   IMPAIRMENTS: are limiting patient from ADLs, leisure, and social participation.   CO-MORBIDITIES: may have co-morbidities  that affects occupational performance. Patient will benefit from skilled OT to address above impairments and improve overall function.  REHAB POTENTIAL: Excellent  PLAN:  OT FREQUENCY: 1x/week  OT DURATION: 6 weeks  PLANNED  INTERVENTIONS: 97535 self care/ADL training, 02889 therapeutic exercise, 97530 therapeutic activity, 97112 neuromuscular re-education, 97039 fluidotherapy, 97760 Orthotic Initial, 97763 Orthotic/Prosthetic subsequent, balance training, visual/perceptual remediation/compensation, energy conservation, coping strategies training, patient/family education, and DME and/or AE instructions  RECOMMENDED OTHER SERVICES: ST evaluation already scheduled  CONSULTED AND AGREED WITH PLAN OF CARE: Patient  PLAN FOR NEXT SESSION:  Further assess Vision for HEP ideas/comp strategies Further sensory assessment, stereognosis Sensory precautions Add Coordination HEP Check Putty HEP   Clarita LITTIE Pride, OT 03/06/2024, 2:24 PM

## 2024-03-08 ENCOUNTER — Inpatient Hospital Stay: Payer: Medicare Other | Attending: Adult Health | Admitting: Adult Health

## 2024-03-08 VITALS — BP 149/75 | HR 70 | Temp 97.6°F | Resp 18 | Wt 203.5 lb

## 2024-03-08 DIAGNOSIS — Z79811 Long term (current) use of aromatase inhibitors: Secondary | ICD-10-CM | POA: Diagnosis not present

## 2024-03-08 DIAGNOSIS — Z1721 Progesterone receptor positive status: Secondary | ICD-10-CM | POA: Diagnosis not present

## 2024-03-08 DIAGNOSIS — D0512 Intraductal carcinoma in situ of left breast: Secondary | ICD-10-CM | POA: Diagnosis not present

## 2024-03-08 DIAGNOSIS — Z87891 Personal history of nicotine dependence: Secondary | ICD-10-CM | POA: Diagnosis not present

## 2024-03-08 DIAGNOSIS — Z8673 Personal history of transient ischemic attack (TIA), and cerebral infarction without residual deficits: Secondary | ICD-10-CM | POA: Insufficient documentation

## 2024-03-08 DIAGNOSIS — Z801 Family history of malignant neoplasm of trachea, bronchus and lung: Secondary | ICD-10-CM | POA: Diagnosis not present

## 2024-03-08 DIAGNOSIS — Z170 Estrogen receptor positive status [ER+]: Secondary | ICD-10-CM | POA: Diagnosis not present

## 2024-03-08 NOTE — Progress Notes (Signed)
 [facility] Cancer Follow up:    Alyssa Hamilton, MD [address] [place] [address]   DIAGNOSIS:  Cancer Staging  Ductal carcinoma in situ (DCIS) of left breast Staging form: Breast, AJCC 8th Edition - Clinical stage from 07/26/2018: Stage 0 (cTis (DCIS), cN0, cM0, ER+, PR+) - Unsigned Nuclear grade: G2 Laterality: Left Stage used in treatment planning: Yes National guidelines used in treatment planning: Yes Type of national guideline used in treatment planning: NCCN    SUMMARY OF ONCOLOGIC HISTORY: Oncology History  Ductal carcinoma in situ (DCIS) of left breast  07/17/2018 Initial Diagnosis   Screening detected left breast mass 11 o'clock position 1.6 cm with irregular margins axilla negative biopsy revealed intermediate grade DCIS ER 100%, PR 90%, Tis NX stage 0   08/18/2018 Surgery   Left lumpectomy: Grade 2 DCIS, 2.3 cm, margins negative, ER 100%, PR 90%, no evidence of necrosis Tis NX stage 0   09/18/2018 -  Anti-estrogen oral therapy   Tamoxifen  20mg  daily, switched to anastrozole  on 12/28/18 due to ringing in her ears, plan for 5 years     CURRENT THERAPY:  INTERVAL HISTORY:  Discussed the use of AI scribe software for clinical note transcription with the patient, who gave verbal consent to proceed.  History of Present Illness Alyssa Cardenas is an 83 year old female who presents for follow-up after completing anastrozole  treatment.  She has stage zero breast cancer, ER and PR positive, status post lumpectomy. She completed anastrozole  after being unable to tolerate tamoxifen . Her recent bilateral breast screening mammogram on August 10, 2023, showed no evidence of malignancy and breast density category B. No changes or concerns regarding her breasts since the last mammogram.  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 She is active and exercises, including walking, though  COVID-19 and neighborhood safety concerns have impacted her routine. She consumes adequate fruits and vegetables, sometimes supplementing with V8 juice.     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   Cardiomyopathy (HCC) 08/01/2018   Left bundle branch block 08/01/2018   Preop cardiovascular exam 08/01/2018   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   Chronic kidney disease, stage 3b (HCC) 03/25/2015   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  MEDICAL HISTORY: Past Medical History:  Diagnosis Date   Anxiety    Breast cancer (HCC)    CAD (coronary artery disease), native coronary artery    Cath 08/27/1991 Normal Left main, calcified proximal LAD, 50% stenosis proximal LAD, 90% stenosis mid LAD, occluded Diag 1, 95% stenosis proximal Diag  2, 95% stenosis proximal CFX, 95% stenosis proximal OM 1, occluded RCA;  CABG w LIMA to LAD, SVG to dx, SVG to OM1-2-circ, SVG to PDA 08/29/1991 Dr. 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   1992   Vitamin D  deficiency 03/10/2015    SURGICAL HISTORY: Past Surgical History:  Procedure Laterality Date   BREAST BIOPSY     BREAST EXCISIONAL BIOPSY Bilateral    BREAST LUMPECTOMY Left    2019 DCIS   BREAST LUMPECTOMY WITH RADIOACTIVE SEED LOCALIZATION Left 08/18/2018   Procedure: LEFT BREAST LUMPECTOMY WITH RADIOACTIVE SEED LOCALIZATION;  Surgeon: Curvin Deward MOULD, MD;  Location: [facility] OR;  Service: General;  Laterality: Left;   CORONARY ARTERY BYPASS GRAFT  1992   EYE SURGERY     cataract removal 2017   TONSILLECTOMY      SOCIAL HISTORY: Social History   Socioeconomic History   Marital status: Married    Spouse name: Not on file   Number of children: Not on file   Years of education: Not on file   Highest education level: Not on file  Occupational History   Not on file  Tobacco Use   Smoking status: Former    Current packs/day: 0.00    Types: Cigarettes    Quit date: 03/10/1975    Years since quitting: 49.0   Smokeless tobacco: Never  Vaping Use   Vaping status: Never Used  Substance and Sexual Activity   Alcohol use: Yes    Alcohol/week: 0.0 standard drinks of alcohol    Comment: Special occasions   Drug use: No   Sexual activity: Yes    Birth control/protection: Post-menopausal  Other Topics Concern   Not on file  Social History Narrative   Not on file   Social Drivers of Health   Financial Resource Strain: Not on file  Food Insecurity: No Food Insecurity (02/04/2024)   Hunger Vital Sign    Worried About Running Out of Food in the Last Year: Never true    Ran Out of Food in the Last Year: Never true   Transportation Needs: No Transportation Needs (02/04/2024)   PRAPARE - Administrator, Civil Service (Medical): No    Lack of Transportation (Non-Medical): No  Physical Activity: Not on file  Stress: Not on file  Social Connections: Moderately Integrated (02/04/2024)   Social Connection and Isolation Panel    Frequency of Communication with Friends and Family: More than three times a week    Frequency of Social Gatherings with Friends and Family: More than three times a week    Attends Religious Services: 1 to 4 times per year    Active Member of [employer] or Organizations: Not on file    Attends Banker Meetings: Never    Marital Status: Married  Catering manager Violence: Not At Risk (02/04/2024)   Humiliation, Afraid, Rape,  and Kick questionnaire    Fear of Current or Ex-Partner: No    Emotionally Abused: No    Physically Abused: No    Sexually Abused: No    FAMILY HISTORY: Family History  Problem Relation Age of Onset   Heart disease Mother    Hyperlipidemia Mother    Hypertension Mother    Heart disease Father    Hypertension Father    Lung cancer Father    Diabetes Maternal Grandmother    Stroke Maternal Grandfather    Stroke Paternal Grandmother    Heart disease Paternal Grandfather    Heart disease Brother    Hyperlipidemia Brother    Hypertension Brother    Mental illness Brother    Heart disease Brother    Hyperlipidemia Brother    Hypertension Brother    BRCA 1/2 Neg Hx    Breast cancer Neg Hx     Review of Systems  Constitutional:  Negative for appetite change, chills, fatigue, fever and unexpected weight change.  HENT:   Negative for hearing loss, lump/mass and trouble swallowing.   Eyes:  Negative for eye problems and icterus.  Respiratory:  Negative for chest tightness, cough and shortness of breath.   Cardiovascular:  Negative for chest pain, leg swelling and palpitations.  Gastrointestinal:  Negative for abdominal distention,  abdominal pain, constipation, diarrhea, nausea and vomiting.  Endocrine: Negative for hot flashes.  Genitourinary:  Negative for difficulty urinating.   Musculoskeletal:  Negative for arthralgias.  Skin:  Negative for itching and rash.  Neurological:  Negative for dizziness, extremity weakness, headaches and numbness.  Hematological:  Negative for adenopathy. Does not bruise/bleed easily.  Psychiatric/Behavioral:  Negative for depression. The patient is not nervous/anxious.       PHYSICAL EXAMINATION     Vitals:   03/08/24 1107  BP: (!) 149/75  Pulse: 70  Resp: 18  Temp: 97.6 F (36.4 C)  SpO2: 97%    Physical Exam Constitutional:      General: She is not in acute distress.    Appearance: Normal appearance. She is not toxic-appearing.  HENT:     Head: Normocephalic and atraumatic.     Mouth/Throat:     Mouth: Mucous membranes are moist.     Pharynx: Oropharynx is clear. No oropharyngeal exudate or posterior oropharyngeal erythema.  Eyes:     General: No scleral icterus. Cardiovascular:     Rate and Rhythm: Normal rate and regular rhythm.     Pulses: Normal pulses.     Heart sounds: Normal heart sounds.  Pulmonary:     Effort: Pulmonary effort is normal.     Breath sounds: Normal breath sounds.  Chest:     Comments: Left breast s/p lumpectomy, no sign of local recurrence, right breast benign Abdominal:     General: Abdomen is flat. Bowel sounds are normal. There is no distension.     Palpations: Abdomen is soft.     Tenderness: There is no abdominal tenderness.  Musculoskeletal:        General: No swelling.     Cervical back: Neck supple.  Lymphadenopathy:     Cervical: No cervical adenopathy.     Upper Body:     Right upper body: No supraclavicular or axillary adenopathy.     Left upper body: No supraclavicular or axillary adenopathy.  Skin:    General: Skin is warm and dry.     Findings: No rash.  Neurological:     General: No focal deficit present.  Mental Status: She is alert.  Psychiatric:        Mood and Affect: Mood normal.        Behavior: Behavior normal.     LABORATORY DATA:  CBC    Component Value Date/Time   WBC 5.1 02/06/2024 0850   RBC 4.55 02/06/2024 0850   HGB 12.3 02/06/2024 0850   HGB 12.4 07/26/2018 0816   HCT 39.9 02/06/2024 0850   PLT 212 02/06/2024 0850   PLT 217 07/26/2018 0816   MCV 87.7 02/06/2024 0850   MCV 80.6 03/10/2015 1133   MCH 27.0 02/06/2024 0850   MCHC 30.8 02/06/2024 0850   RDW 15.2 02/06/2024 0850   LYMPHSABS 1.8 02/04/2024 1500   MONOABS 0.4 02/04/2024 1500   EOSABS 0.2 02/04/2024 1500   BASOSABS 0.1 02/04/2024 1500    CMP     Component Value Date/Time   NA 136 02/06/2024 0850   NA 140 09/15/2022 1337   K 3.7 02/06/2024 0850   CL 103 02/06/2024 0850   CO2 23 02/06/2024 0850   GLUCOSE 96 02/06/2024 0850   BUN 14 02/06/2024 0850   BUN 19 09/15/2022 1337   CREATININE 1.22 (H) 02/06/2024 0850   CREATININE 1.21 (H) 07/26/2018 0816   CALCIUM  8.8 (L) 02/06/2024 0850   PROT 6.9 02/04/2024 1500   ALBUMIN 3.6 02/04/2024 1500   AST 32 02/04/2024 1500   AST 23 07/26/2018 0816   ALT 19 02/04/2024 1500   ALT 18 07/26/2018 0816   ALKPHOS 47 02/04/2024 1500   BILITOT 0.6 02/04/2024 1500   BILITOT 0.5 07/26/2018 0816   GFRNONAA 45 (L) 02/06/2024 0850   GFRNONAA 43 (L) 07/26/2018 0816   GFRAA 57 (L) 08/14/2018 0840   GFRAA 49 (L) 07/26/2018 0816     ASSESSMENT and THERAPY PLAN:   Ductal carcinoma in situ (DCIS) of left breast 08/18/2018: Left lumpectomy: Grade 2 DCIS, 2.3 cm, margins negative, ER 100%, PR 90%, no evidence of necrosis Tis NX stage 0 Patient was provided option for radiation versus antiestrogen therapy.  Patient preferred antiestrogens.    Current treatment: Tamoxifen  20 mg daily started 09/01/2018 held 11/22/2018 for tinnitus, switched to anastrozole  11/22/2018.    Assessment and Plan Assessment & Plan Stage 0 breast cancer, ER/PR positive Status post  lumpectomy, completed anastrozole . Recent mammogram showed no malignancy. - Continue annual mammograms, next due in December. - Recommend annual clinical breast exam. - Encourage healthy diet and 150 minutes of exercise weekly to reduce recurrence risk. - Advise to report any new breast changes immediately. - Schedule follow-up appointment for next year.  Stroke with right arm rehabilitation Recent stroke with speech recovery, ongoing right arm rehabilitation. - Continue right arm rehabilitation. - Follow up with Dr. Rosemarie in September. - Encourage regular physical activity to regain strength.     All questions were answered. The patient knows to call the clinic with any problems, questions or concerns. We can certainly see the patient much sooner if necessary.  Total encounter time:20 minutes*in face-to-face visit time, chart review, lab review, care coordination, order entry, and documentation of the encounter time.    Morna Kendall, NP 03/14/24 8:51 AM Medical Oncology and Hematology [facility] [address] [place], [place] [address] Tel. [phone]    Fax. [phone]  *Total Encounter Time as defined by the Centers for Medicare and Medicaid Services includes, in addition to the face-to-face time of a patient visit (documented in the note above) non-face-to-face time: obtaining and reviewing outside history,  ordering and reviewing medications, tests or procedures, care coordination (communications with other health care professionals or caregivers) and documentation in the medical record.

## 2024-03-13 ENCOUNTER — Ambulatory Visit: Admitting: Occupational Therapy

## 2024-03-13 ENCOUNTER — Ambulatory Visit: Admitting: Speech Pathology

## 2024-03-13 DIAGNOSIS — R29818 Other symptoms and signs involving the nervous system: Secondary | ICD-10-CM

## 2024-03-13 DIAGNOSIS — R278 Other lack of coordination: Secondary | ICD-10-CM

## 2024-03-13 DIAGNOSIS — M6281 Muscle weakness (generalized): Secondary | ICD-10-CM

## 2024-03-13 DIAGNOSIS — R208 Other disturbances of skin sensation: Secondary | ICD-10-CM

## 2024-03-13 DIAGNOSIS — R471 Dysarthria and anarthria: Secondary | ICD-10-CM | POA: Diagnosis not present

## 2024-03-13 NOTE — Therapy (Unsigned)
 OUTPATIENT OCCUPATIONAL THERAPY NEURO TREATMENT  Patient Name: SERA HITSMAN MRN: [id number] DOB:09-Sep-1940, 83 y.o., female Today's Date: 03/13/2024  PCP: Larnell Hamilton, MD REFERRING PROVIDER: Rosemarie Eather RAMAN, MD  END OF SESSION:  OT End of Session - 03/13/24 1318     Visit Number 3    Number of Visits 6    Date for OT Re-Evaluation 04/05/24    Authorization Type [employer] Medicare    Authorization Time Period Auth Required VL: MN    OT Start Time 1318    OT Stop Time 1400    OT Time Calculation (min) 42 min    Equipment Utilized During Treatment FM objects, dice, cards    Activity Tolerance Patient tolerated treatment well    Behavior During Therapy WFL for tasks assessed/performed          Past Medical History:  Diagnosis Date   Anxiety    Breast cancer (HCC)    CAD (coronary artery disease), native coronary artery    Cath 08/27/1991 Normal Left main, calcified proximal LAD, 50% stenosis proximal LAD, 90% stenosis mid LAD, occluded Diag 1, 95% stenosis proximal Diag 2, 95% stenosis proximal CFX, 95% stenosis proximal OM 1, occluded RCA;  CABG w LIMA to LAD, SVG to dx, SVG to OM1-2-circ, SVG to PDA 08/29/1991 Dr. 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   1992   Vitamin D  deficiency 03/10/2015   Past Surgical History:  Procedure Laterality Date   BREAST BIOPSY     BREAST EXCISIONAL BIOPSY Bilateral    BREAST LUMPECTOMY Left    2019 DCIS   BREAST  LUMPECTOMY WITH RADIOACTIVE SEED LOCALIZATION Left 08/18/2018   Procedure: LEFT BREAST LUMPECTOMY WITH RADIOACTIVE SEED LOCALIZATION;  Surgeon: Curvin Deward MOULD, MD;  Location: [facility] OR;  Service: General;  Laterality: Left;   CORONARY ARTERY BYPASS GRAFT  1992   EYE SURGERY     cataract removal 2017   TONSILLECTOMY     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   Flatulence, eructation and gas pain 11/24/2021   Rectal bleeding 11/24/2021   Chronic systolic heart failure (HCC) 11/20/2021   Insomnia 11/20/2021   Myocardial infarction [facility])    Chronic kidney disease    Cancer (HCC)    Breast cancer (HCC)    Anxiety    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   Cardiomyopathy (HCC) 08/01/2018   Left bundle branch block 08/01/2018   Preop cardiovascular exam 08/01/2018   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   Chronic kidney disease, stage 3b (HCC) 03/25/2015   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   CAD (coronary artery disease), native coronary artery    Obesity (BMI 30-39.9)    History of MI (myocardial infarction) 03/10/2015   History of colonic polyps  03/10/2015   Vitamin D  deficiency 03/10/2015   Hypertensive heart disease     Hyperlipidemia    Dermatitis 08/14/2013   Essential hypertension 05/02/2012    ONSET DATE: 02/09/2024  REFERRING DIAG: I63.9 (ICD-10-CM) - Acute ischemic stroke  THERAPY DIAG:  Other lack of coordination  Muscle weakness (generalized)  Other disturbances of skin sensation  Other symptoms and signs involving the nervous system  Rationale for Evaluation and Treatment: Rehabilitation  SUBJECTIVE:   SUBJECTIVE STATEMENT: Pt reports practicing on the putty several times, helping loosen up the hand. .   Hans't really had any headaches  Pt accompanied by: significant other - husband waiting in lobby and drove  her  PERTINENT HISTORY:  Patient presents to ED secondary to complaint of numbness, right upper extremity weakness, right facial droop and slurred speech, MRI brain significant for acute CVA - acute infarct of left central stenosis, admitted for further workup with DC recommending outpt OT/PT/ST  PMHx: anxiety, Breast CA, CAD, cardiomyopathy, s/p CABG, CKD, MI, HLD, HTN, LBBB, Vit D deficiency   PRECAUTIONS: Other: heart monitor in place until July 6th  WEIGHT BEARING RESTRICTIONS: No  PAIN:  Are you having pain? No  FALLS: Has patient fallen in last 6 months? No - Last fall was in 2024 - stepping out of the shower and fell to her knees  LIVING ENVIRONMENT: Lives with: lives with their spouse Lives in: House Stairs: Yes: External: 1 step to deck at back door; none Has following equipment at home: shower chair built into walk in shower and Grab bar on back wall and one to step into shower  PLOF: Independent and still drove  PATIENT GOALS: Get back where I was before  OBJECTIVE:  Note: Objective measures were completed at Evaluation unless otherwise noted.  HAND DOMINANCE: Right  ADLs: Overall ADLs: Mostly independent, has not had as much energy as before Transfers/ambulation related to ADLs: Ind w/o AE Eating: Ind Grooming: Ind although overhead reaching is tiresome UB Dressing: Ind LB Dressing: Ind Toileting: Ind Bathing: Ind Tub Shower transfers: Mod Ind Equipment: Grab bars, Walk in shower, and Built in shower seat  IADLs: Shopping: Hasn't been recently, son orders online and has it delivered Light housekeeping: Husband helping, pt reports she was dropping things Meal Prep: nothing elaborate, using stove and microwave Community mobility: hasn't been getting out a lot since she's been home Medication management: Uses a pill box and sorts her medication Financial management: Pt does her own bills and husband does his Handwriting: 90% legible - less flourishes  etc.  Past hobbies - Crochet/knit, painted landscapes and ballet dancers   MOBILITY STATUS: Independent  POSTURE COMMENTS:  rounded shoulders and forward head Sitting balance: WFL  ACTIVITY TOLERANCE: Activity tolerance: Less than before, trying to work up endurance  FUNCTIONAL OUTCOME MEASURES: Quick Dash: 22.7  UPPER EXTREMITY ROM:    Active ROM Right eval Left eval  Shoulder flexion <100 <100  Shoulder abduction    Shoulder adduction    Shoulder extension    Shoulder internal rotation    Shoulder external rotation    Elbow flexion WFL WNL  Elbow extension    Wrist flexion    Wrist extension    Wrist ulnar deviation    Wrist radial deviation    Wrist pronation    Wrist supination    (Blank rows = not tested)  UPPER EXTREMITY MMT:     MMT Right eval Left eval  Shoulder flexion 4 4  Shoulder abduction  Shoulder adduction    Shoulder extension    Shoulder internal rotation    Shoulder external rotation    Middle trapezius    Lower trapezius    Elbow flexion 5 5  Elbow extension    Wrist flexion    Wrist extension    Wrist ulnar deviation    Wrist radial deviation    Wrist pronation    Wrist supination    (Blank rows = not tested)  HAND FUNCTION: Grip strength: Right: 18.5, 15.8, 19.4 lbs; Left: 28.2, 26.4, 27.9 lbs Average: Right: 17.9 lbs, Left 27.5 lbs  COORDINATION: 9 Hole Peg test: Right: 25.36 sec; Left: 25.00 sec Box and Blocks:  Right 45 blocks, Left 43 blocks  SENSATION: Light touch: Impaired   EDEMA: NA  MUSCLE TONE: Generally WFL  COGNITION: Overall cognitive status: Within functional limits for tasks assessed  VISION: Subjective report: Ocular Auras/Migraines Baseline vision: TBA Visual history: TBA Pt reports getting bright, spiky, flashing lights about 1 year ago and would have to sit in the dark and even cover her head to get it real dark.  She notes sometimes her eyes get tired but feels like she sees fine all  around.  VISION ASSESSMENT: Not tested Tracking/Visual pursuits: Decreased smoothness with horizontal tracking especially with movement to Left side as well as decreased peripheral vision which appeared to be greater on the L than R but still decreased on both sides ~ 45*  Patient has difficulty with following activities due to following visual impairments: TBA  OBSERVATIONS: Pt ambulates with no AE and no loss of balance. Slight changes noted in her speech with ST eval already scheduled.                                                                                                                            TREATMENT DATE: 03/06/24    Coordination Exercise/Activity handout with images provided for various activities to work on B UE finger ROM, dexterity and isolated movements with demonstration and practice, as well as modification, hand over hand guidance and cues throughout to improve technique, digital isolation and ease of performing task.  Tasks included:  Pick up coins, dominoes, buttons, marbles, dried beans/pasta of different sizes ... To place in containers To stack - with guidance to work on include/isolate specific fingers. To pick up items one at a time until patient got 5+ in their hand and then move item from palm to fingertips to release ie) Finger-to-palm then palm-to-finger translation of small items - Options to vary difficulty include using a washcloth under items like coins or using larger items (checkers vs coins or blocks/dominos vs dice) for increased ease of picking up items.  Shuffling, Flipping and dealing cards 1 at a time. -- Setup patient to work on sorting cards, focusing on using index finger with thumb to flip cards or holding deck of cards in palm of hand and push off 1 card at a time from the  top of the deck using only thumb    Rotate golf balls (clockwise and counter-clockwise) with forearm pronated and balls on table or supinated and balls in hand.   Twirl  pen/cil between fingers. - Encouragement to isolate fingers individually and twirl (rotation) or flipping and shift up and down the pen (translation) to get it in position for writing or erasing.    Tear a piece of paper towel and roll it into small balls with fingertips ie) straw wrapping when eating out.    Patient is encouraged to take breaks, relax arm/shoulder by supporting forearm, minimize compensatory motions and a try different activities throughout the day/week including games like Londa (for the dice), card games, Connect 4 etc.   Patient benefited from extra time, verbal/tactile cues, and modeling of task to allow time for processing of verbal instructions and improve motor planning of unfamiliar movements.     PATIENT EDUCATION: Education details: Putty Activities Person educated: Patient Education method: Explanation, Demonstration, Verbal cues, and Handouts Education comprehension: verbalized understanding, verbal cues required, and needs further education  HOME EXERCISE PROGRAM: 03/06/24: Putty Activities  03/13/24: Coordination Activities    GOALS: Goals reviewed with patient? Yes  SHORT TERM GOALS: Target date: 03/16/24  Patient will demonstrate initial R UE coordination and putty HEP with 25% verbal cues or less for proper execution. Baseline: New to outpt OT Goal status: IN Progress    2.  Pt will recall the 5 main sensory precautions (cold, heat, sharp/breakable, chemical, and heavy) as needed to prevent injury/harm secondary to impairments.  Baseline: New to outpt OT  Goal status: IN Progress   3. Pt will verbalize understanding of adapted strategies and/or equipment PRN to increase safety and independence with ADLs and IADLs (I.e. with decreased pain and joint protection).             Baseline: New to outpt OT with dominant RUE affected by stroke and arthritis             Goal Status: INITIAL  4.  Patient will demonstrate >20 lbs RUE grip strength as needed  to open jars and other containers. Baseline: Right: 17.9 lbs, Left 27.5 lbs Goal status: IN Progress   LONG TERM GOALS: Target date: 04/05/24   Patient will demonstrate updated UE HEP with visual handouts only for proper execution.  Baseline: New to outpt OT  Goal status: IN Progress   2.  Patient will demo improved FM coordination as evidenced by completing painting activities per prior hobby with minimal difficulty.  Baseline: New to outpt OT and not engaged in hobbies Goal status: IN Progress   3.  Pt will write a list, card or message with no significant change in her font and maintain >90% legibility with increased self reports of ease. Baseline: Pt finds writing not as easy and her font is slightly less flourished as prior to CVA Goal status: INITIAL    4. Pt will be aware/use sensory stimulation activities to promote improved sensory awareness of R/L hand for stereognosis and fine motor tasks.  Baseline: Sensation is different, dull and less accurate on R side since stroke Goal status: INITIAL   ASSESSMENT:  CLINICAL IMPRESSION: Patient is a 83 y.o. female who was seen today for occupational therapy treatment for dominant RUE deficits s/p CVA. Patient responded well to BUE activities with putty for strength, coordination and sensory stimulation.  Pt will benefit from continued skilled OT services in the outpatient setting to work on impairments as noted  at evaluation to help pt return to highest level of function as able.    PERFORMANCE DEFICITS: in functional skills including ADLs, IADLs, coordination, dexterity, proprioception, sensation, ROM, strength, flexibility, Fine motor control, Gross motor control, balance, decreased knowledge of precautions, decreased knowledge of use of DME, and vision, cognitive skills including safety awareness, and psychosocial skills including coping strategies, environmental adaptation, and routines and behaviors.   IMPAIRMENTS: are limiting  patient from ADLs, leisure, and social participation.   CO-MORBIDITIES: may have co-morbidities  that affects occupational performance. Patient will benefit from skilled OT to address above impairments and improve overall function.  REHAB POTENTIAL: Excellent  PLAN:  OT FREQUENCY: 1x/week  OT DURATION: 6 weeks  PLANNED INTERVENTIONS: 97535 self care/ADL training, 02889 therapeutic exercise, 97530 therapeutic activity, 97112 neuromuscular re-education, 97039 fluidotherapy, 97760 Orthotic Initial, 97763 Orthotic/Prosthetic subsequent, balance training, visual/perceptual remediation/compensation, energy conservation, coping strategies training, patient/family education, and DME and/or AE instructions  RECOMMENDED OTHER SERVICES: ST evaluation already scheduled  CONSULTED AND AGREED WITH PLAN OF CARE: Patient  PLAN FOR NEXT SESSION:  Further assess Vision for HEP ideas/comp strategies Further sensory assessment, stereognosis Sensory precautions Add Coordination HEP Check Putty HEP   Clarita LITTIE Pride, OT 03/13/2024, 2:27 PM

## 2024-03-14 ENCOUNTER — Encounter: Payer: Self-pay | Admitting: Adult Health

## 2024-03-14 NOTE — Assessment & Plan Note (Signed)
 08/18/2018: Left lumpectomy: Grade 2 DCIS, 2.3 cm, margins negative, ER 100%, PR 90%, no evidence of necrosis Tis NX stage 0 Patient was provided option for radiation versus antiestrogen therapy.  Patient preferred antiestrogens.    Current treatment: Tamoxifen  20 mg daily started 09/01/2018 held 11/22/2018 for tinnitus, switched to anastrozole  11/22/2018.

## 2024-03-15 ENCOUNTER — Ambulatory Visit: Attending: Cardiovascular Disease

## 2024-03-15 DIAGNOSIS — I639 Cerebral infarction, unspecified: Secondary | ICD-10-CM

## 2024-03-18 DIAGNOSIS — I639 Cerebral infarction, unspecified: Secondary | ICD-10-CM | POA: Diagnosis not present

## 2024-03-19 ENCOUNTER — Ambulatory Visit: Payer: Self-pay | Admitting: Cardiovascular Disease

## 2024-03-21 ENCOUNTER — Ambulatory Visit: Admitting: Occupational Therapy

## 2024-03-21 ENCOUNTER — Ambulatory Visit: Admitting: Speech Pathology

## 2024-03-21 DIAGNOSIS — R471 Dysarthria and anarthria: Secondary | ICD-10-CM | POA: Diagnosis not present

## 2024-03-21 DIAGNOSIS — R29818 Other symptoms and signs involving the nervous system: Secondary | ICD-10-CM

## 2024-03-21 DIAGNOSIS — R208 Other disturbances of skin sensation: Secondary | ICD-10-CM

## 2024-03-21 DIAGNOSIS — R278 Other lack of coordination: Secondary | ICD-10-CM

## 2024-03-21 DIAGNOSIS — M6281 Muscle weakness (generalized): Secondary | ICD-10-CM

## 2024-03-21 NOTE — Therapy (Signed)
 OUTPATIENT OCCUPATIONAL THERAPY NEURO TREATMENT  Patient Name: Alyssa Cardenas MRN: [id number] DOB:[birth year], 83 y.o., female Today's Date: 03/21/2024  PCP: Larnell Hamilton, MD REFERRING PROVIDER: Rosemarie Eather RAMAN, MD  END OF SESSION:  OT End of Session - 03/21/24 1015     Visit Number 4    Number of Visits 6    Date for OT Re-Evaluation 04/05/24    Authorization Type [employer] Medicare    Authorization Time Period Auth Required VL: [place]    OT Start Time 1020    OT Stop Time 1100    OT Time Calculation (min) 40 min    Equipment Utilized During Treatment --    Activity Tolerance Patient tolerated treatment well    Behavior During Therapy WFL for tasks assessed/performed          Past Medical History:  Diagnosis Date   Anxiety    Breast cancer (HCC)    CAD (coronary artery disease), native coronary artery    Cath 08/27/1991 Normal Left main, calcified proximal LAD, 50% stenosis proximal LAD, 90% stenosis mid LAD, occluded Diag 1, 95% stenosis proximal Diag 2, 95% stenosis proximal CFX, 95% stenosis proximal OM 1, occluded RCA;  CABG w LIMA to LAD, SVG to dx, SVG to OM1-2-circ, SVG to PDA 08/29/1991 Dr. 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   1992   Vitamin D  deficiency 03/10/2015   Past Surgical History:  Procedure Laterality Date   BREAST BIOPSY     BREAST EXCISIONAL BIOPSY Bilateral    BREAST LUMPECTOMY Left    2019 DCIS   BREAST LUMPECTOMY WITH  RADIOACTIVE SEED LOCALIZATION Left 08/18/2018   Procedure: LEFT BREAST LUMPECTOMY WITH RADIOACTIVE SEED LOCALIZATION;  Surgeon: Curvin Deward MOULD, MD;  Location: [facility] OR;  Service: General;  Laterality: Left;   CORONARY ARTERY BYPASS GRAFT  1992   EYE SURGERY     cataract removal 2017   TONSILLECTOMY     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   Flatulence, eructation and gas pain 11/24/2021   Rectal bleeding 11/24/2021   Chronic systolic heart failure (HCC) 11/20/2021   Insomnia 11/20/2021   Myocardial infarction [facility])    Chronic kidney disease    Cancer (HCC)    Breast cancer (HCC)    Anxiety    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   Cardiomyopathy (HCC) 08/01/2018   Left bundle branch block 08/01/2018   Preop cardiovascular exam 08/01/2018   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   Chronic kidney disease, stage 3b (HCC) 03/25/2015   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   CAD (coronary artery disease), native coronary artery    Obesity (BMI 30-39.9)    History of MI (myocardial infarction) 03/10/2015   History of colonic polyps 03/10/2015  Vitamin D  deficiency 03/10/2015   Hypertensive heart disease     Hyperlipidemia    Dermatitis 08/14/2013   Essential hypertension 05/02/2012    ONSET DATE: 02/09/2024  REFERRING DIAG: I63.9 (ICD-10-CM) - Acute ischemic stroke  THERAPY DIAG:  Other lack of coordination  Muscle weakness (generalized)  Other disturbances of skin sensation  Other symptoms and signs involving the nervous system  Rationale for Evaluation and Treatment: Rehabilitation  SUBJECTIVE:   SUBJECTIVE STATEMENT: Pt reports difficulty with sleep, waking up every 1.5-2 hours to go to the restroom and getting maybe 7-8 hours in total. Endorses eye fatigue, especially at end of the day.   Pt hasn't really had any ocular migraines  lately.   Pt accompanied by: significant other - husband waiting in lobby and drove her  PERTINENT HISTORY:  Patient presents to ED secondary to complaint of numbness, right upper extremity weakness, right facial droop and slurred speech, MRI brain significant for acute CVA - acute infarct of left central stenosis, admitted for further workup with DC recommending outpt OT/PT/ST  PMHx: anxiety, Breast CA, CAD, cardiomyopathy, s/p CABG, CKD, MI, HLD, HTN, LBBB, Vit D deficiency   PRECAUTIONS: Other: heart monitor in place until July 6th  WEIGHT BEARING RESTRICTIONS: No  PAIN:  Are you having pain? No  FALLS: Has patient fallen in last 6 months? No - Last fall was in 2024 - stepping out of the shower and fell to her knees  LIVING ENVIRONMENT: Lives with: lives with their spouse Lives in: House Stairs: Yes: External: 1 step to deck at back door; none Has following equipment at home: shower chair built into walk in shower and Grab bar on back wall and one to step into shower  PLOF: Independent and still drove  PATIENT GOALS: Get back where I was before  OBJECTIVE:  Note: Objective measures were completed at Evaluation unless otherwise noted.  HAND DOMINANCE: Right  ADLs: Overall ADLs: Mostly independent, has not had as much energy as before Transfers/ambulation related to ADLs: Ind w/o AE Eating: Ind Grooming: Ind although overhead reaching is tiresome UB Dressing: Ind LB Dressing: Ind Toileting: Ind Bathing: Ind Tub Shower transfers: Mod Ind Equipment: Grab bars, Walk in shower, and Built in shower seat  IADLs: Shopping: Hasn't been recently, son orders online and has it delivered Light housekeeping: Husband helping, pt reports she was dropping things Meal Prep: nothing elaborate, using stove and microwave Community mobility: hasn't been getting out a lot since she's been home Medication management: Uses a pill box and sorts her medication Financial management: Pt does  her own bills and husband does his Handwriting: 90% legible - less flourishes etc.  Past hobbies - Crochet/knit, painted landscapes and ballet dancers   MOBILITY STATUS: Independent  POSTURE COMMENTS:  rounded shoulders and forward head Sitting balance: WFL  ACTIVITY TOLERANCE: Activity tolerance: Less than before, trying to work up endurance  FUNCTIONAL OUTCOME MEASURES: Quick Dash: 22.7  UPPER EXTREMITY ROM:    Active ROM Right eval Left eval  Shoulder flexion <100 <100  Shoulder abduction    Shoulder adduction    Shoulder extension    Shoulder internal rotation    Shoulder external rotation    Elbow flexion WFL WNL  Elbow extension    Wrist flexion    Wrist extension    Wrist ulnar deviation    Wrist radial deviation    Wrist pronation    Wrist supination    (Blank rows = not tested)  UPPER EXTREMITY MMT:  MMT Right eval Left eval  Shoulder flexion 4 4  Shoulder abduction    Shoulder adduction    Shoulder extension    Shoulder internal rotation    Shoulder external rotation    Middle trapezius    Lower trapezius    Elbow flexion 5 5  Elbow extension    Wrist flexion    Wrist extension    Wrist ulnar deviation    Wrist radial deviation    Wrist pronation    Wrist supination    (Blank rows = not tested)  HAND FUNCTION: Grip strength: Right: 18.5, 15.8, 19.4 lbs; Left: 28.2, 26.4, 27.9 lbs Average: Right: 17.9 lbs, Left 27.5 lbs  COORDINATION: 9 Hole Peg test: Right: 25.36 sec; Left: 25.00 sec Box and Blocks:  Right 45 blocks, Left 43 blocks  SENSATION: Light touch: Impaired   EDEMA: NA  MUSCLE TONE: Generally WFL  COGNITION: Overall cognitive status: Within functional limits for tasks assessed  VISION: Subjective report: Ocular Auras/Migraines Baseline vision: TBA Visual history: TBA Pt reports getting bright, spiky, flashing lights about 1 year ago and would have to sit in the dark and even cover her head to get it real dark.   She notes sometimes her eyes get tired but feels like she sees fine all around.  VISION ASSESSMENT: Not tested Tracking/Visual pursuits: Decreased smoothness with horizontal tracking especially with movement to Left side as well as decreased peripheral vision which appeared to be greater on the L than R but still decreased on both sides ~ 45*  Patient has difficulty with following activities due to following visual impairments: TBA  OBSERVATIONS: Pt ambulates with no AE and no loss of balance. Slight changes noted in her speech with ST eval already scheduled.                                                                                                                            TREATMENT :    OT educated pt on visual scanning activities and general vision strategies as noted in pt instructions.   OT educated pt on key sleep hygiene strategies as noted in pt instructions, including: Maintaining a consistent sleep/wake schedule Creating a quiet, dark, and cool sleep environment Avoiding screens and stimulants before bedtime Making sleep environment comfortable Managing food and fluid intake to reduce nighttime awakenings Getting regular physical activity/limiting naps Reducing bad sleep habits while incorporating better sleep habits Tips for relaxation When to seek additional help Discussed stroke-specific considerations (e.g., increased sleep needs, safety with nighttime mobility)  PATIENT EDUCATION: Education details: vision comp strategies and sleep hygiene Person educated: Patient Education method: Explanation, Demonstration, Verbal cues, and Handouts Education comprehension: verbalized understanding, verbal cues required, and needs further education  HOME EXERCISE PROGRAM: 03/06/24: Putty Activities  03/13/24: Coordination Activities  03/21/2024: vision comp strategies and sleep hygiene  GOALS: Goals reviewed with patient? Yes  SHORT TERM GOALS: Target date: 03/16/24  Patient  will demonstrate initial R UE coordination  and putty HEP with 25% verbal cues or less for proper execution. Baseline: New to outpt OT Goal status: IN Progress    2.  Pt will recall the 5 main sensory precautions (cold, heat, sharp/breakable, chemical, and heavy) as needed to prevent injury/harm secondary to impairments.  Baseline: New to outpt OT  Goal status: IN Progress   3. Pt will verbalize understanding of adapted strategies and/or equipment PRN to increase safety and independence with ADLs and IADLs (I.e. with decreased pain and joint protection).             Baseline: New to outpt OT with dominant RUE affected by stroke and arthritis             Goal Status: INITIAL  4.  Patient will demonstrate >20 lbs RUE grip strength as needed to open jars and other containers. Baseline: Right: 17.9 lbs, Left 27.5 lbs Goal status: IN Progress   LONG TERM GOALS: Target date: 04/05/24   Patient will demonstrate updated UE HEP with visual handouts only for proper execution.  Baseline: New to outpt OT  Goal status: IN Progress   2.  Patient will demo improved FM coordination as evidenced by completing painting activities per prior hobby with minimal difficulty.  Baseline: New to outpt OT and not engaged in hobbies Goal status: IN Progress   3.  Pt will write a list, card or message with no significant change in her font and maintain >90% legibility with increased self reports of ease. Baseline: Pt finds writing not as easy and her font is slightly less flourished as prior to CVA Goal status: IN Progress    4. Pt will be aware/use sensory stimulation activities to promote improved sensory awareness of R/L hand for stereognosis and fine motor tasks.  Baseline: Sensation is different, dull and less accurate on R side since stroke Goal status: IN Progress  ASSESSMENT:  CLINICAL IMPRESSION: Patient verbalizes good understanding of all education provided this session. Sleep disturbance likely  related to poor sleep habits and inconsistent bedtime routine. Patient may benefit from structured sleep hygiene education and behavioral strategies.  PERFORMANCE DEFICITS: in functional skills including ADLs, IADLs, coordination, dexterity, proprioception, sensation, ROM, strength, flexibility, Fine motor control, Gross motor control, balance, decreased knowledge of precautions, decreased knowledge of use of DME, and vision, cognitive skills including safety awareness, and psychosocial skills including coping strategies, environmental adaptation, and routines and behaviors.   IMPAIRMENTS: are limiting patient from ADLs, leisure, and social participation.   CO-MORBIDITIES: may have co-morbidities  that affects occupational performance. Patient will benefit from skilled OT to address above impairments and improve overall function.  REHAB POTENTIAL: Excellent  PLAN:  OT FREQUENCY: 1x/week  OT DURATION: 6 weeks  PLANNED INTERVENTIONS: 97535 self care/ADL training, 02889 therapeutic exercise, 97530 therapeutic activity, 97112 neuromuscular re-education, 97039 fluidotherapy, 97760 Orthotic Initial, 97763 Orthotic/Prosthetic subsequent, balance training, visual/perceptual remediation/compensation, energy conservation, coping strategies training, patient/family education, and DME and/or AE instructions  RECOMMENDED OTHER SERVICES: ST evaluation already scheduled  CONSULTED AND AGREED WITH PLAN OF CARE: Patient  PLAN FOR NEXT SESSION:  Review vision comp strategies and sleep hygiene as needed Further sensory assessment, stereognosis Sensory precautions Check Putty HEP & Coordination HEP   Jocelyn CHRISTELLA Bottom, OT 03/21/2024, 11:10 AM

## 2024-03-21 NOTE — Patient Instructions (Addendum)
 Vision Strategies  1. Look for the edge of objects (to the left and/or right) so that you make sure you are seeing all of an object  2. Turn your head when walking, scan from side to side, particularly in busy environments  3. Use an organized scanning pattern. It's usually easier to scan from top to bottom, and left to right (like you are reading)  4. Double check yourself  5. Use a line guide (like a blank piece of paper) or your finger when reading  6. If necessary, place brightly colored tape at end of table or work area as a reminder to always look until you see the tape.    Activities to try at home to encourage visual scanning:   1. Word searches  2. Mazes  3. Puzzles  4. Card games  5. Computer games and/or searches  6. Connect-the-dots  Activities for environmental (larger) scanning:   1. With supervision, scan for items in grocery store or drugstore.  Begin with a familiar store, then progress to a new store you've never been in before. Make sure you have supervision with this.   2. With supervision, tell a family member or caregiver when it is safe to cross a street after looking all directions and any side streets. However, do NOT cross street unless family member or caregiver is with you and says it is OK   ?? Sleep Hygiene Handout ?? What Is Sleep Hygiene? Sleep hygiene refers to healthy habits and practices that help improve the quality, duration, and consistency of your sleep. Good sleep hygiene supports mental, emotional, and physical health.  ? Tips for Better Sleep Hygiene 1. Stick to a Consistent Sleep Schedule Go to bed and wake up at the same time every day--even on weekends. Helps regulate your body's internal clock. 2. Create a Relaxing Bedtime Routine Try calming activities before bed (e.g., reading, gentle stretching, meditation). Avoid stressful conversations or work tasks right before sleep. Take a bath or shower. 3. Limit Exposure to Light  at Night Dim the lights in the evening. Avoid screens (phones, TVs, computers) at least 1 hour before bed. Use blue light filters if necessary. 4. Make Your Sleep Environment Comfortable Keep your bedroom dark, quiet, and cool (60-64F / 15-19C is ideal). Use blackout curtains, white noise machines, or earplugs if needed. Invest in a comfortable mattress and pillows. Change your bedsheets and make your bed Practice proper sleep positioning Incorporate smells that you enjoy/help you relax like lavender, peppermint, etc. (lotion, bath/beauty products, essential oils/diffuser) 5. Watch What You Eat & Drink Avoid large meals, caffeine, and alcohol close to bedtime. Try to finish eating at least 2-3 hours before bed.   6. Get Regular Physical Activity Exercise during the day can help you fall asleep faster and enjoy deeper sleep. (complete therapy exercises, walking, go to the gym) Avoid intense workouts late in the evening. 7. Limit Naps Keep naps short (20-30 minutes). Avoid napping late in the afternoon or evening.  ?? Habits That Interfere with Sleep Using your bed for work, watching TV, or eating. Staying in bed when you can't fall asleep (if you're awake for 20+ minutes, get up and do something relaxing in dim light).  ???? Bonus Tips for Relaxation Try breathing exercises, progressive muscle relaxation, or guided meditation. Apps like Calm, Headspace, or Insight Timer can be helpful.  ?? When to Seek Help Consider talking to a doctor or sleep specialist if: You regularly have trouble falling or staying asleep.  You snore loudly or gasp for air during sleep. You feel very sleepy during the day despite adequate sleep.

## 2024-03-27 ENCOUNTER — Encounter: Admitting: Speech Pathology

## 2024-03-27 ENCOUNTER — Ambulatory Visit: Admitting: Occupational Therapy

## 2024-03-27 DIAGNOSIS — M6281 Muscle weakness (generalized): Secondary | ICD-10-CM

## 2024-03-27 DIAGNOSIS — R278 Other lack of coordination: Secondary | ICD-10-CM

## 2024-03-27 DIAGNOSIS — R29818 Other symptoms and signs involving the nervous system: Secondary | ICD-10-CM

## 2024-03-27 DIAGNOSIS — R208 Other disturbances of skin sensation: Secondary | ICD-10-CM

## 2024-03-27 DIAGNOSIS — R471 Dysarthria and anarthria: Secondary | ICD-10-CM | POA: Diagnosis not present

## 2024-03-27 NOTE — Therapy (Unsigned)
 OUTPATIENT OCCUPATIONAL THERAPY NEURO TREATMENT  Patient Name: Alyssa Cardenas MRN: [id number] DOB:27-Jul-1941, 83 y.o., female Today's Date: 03/27/2024  PCP: Larnell Hamilton, MD REFERRING PROVIDER: Rosemarie Eather RAMAN, MD  END OF SESSION:  OT End of Session - 03/27/24 1017     Visit Number 5    Number of Visits 6    Date for OT Re-Evaluation 04/05/24    Authorization Type [employer] Medicare    Authorization Time Period Auth Required VL: [place]    OT Start Time 1017    OT Stop Time 1100    OT Time Calculation (min) 43 min    Activity Tolerance Patient tolerated treatment well    Behavior During Therapy WFL for tasks assessed/performed          Past Medical History:  Diagnosis Date   Anxiety    Breast cancer (HCC)    CAD (coronary artery disease), native coronary artery    Cath 08/27/1991 Normal Left main, calcified proximal LAD, 50% stenosis proximal LAD, 90% stenosis mid LAD, occluded Diag 1, 95% stenosis proximal Diag 2, 95% stenosis proximal CFX, 95% stenosis proximal OM 1, occluded RCA;  CABG w LIMA to LAD, SVG to dx, SVG to OM1-2-circ, SVG to PDA 08/29/1991 Dr. 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   1992   Vitamin D  deficiency 03/10/2015   Past Surgical History:  Procedure Laterality Date   BREAST BIOPSY     BREAST EXCISIONAL BIOPSY Bilateral    BREAST LUMPECTOMY Left    2019 DCIS   BREAST LUMPECTOMY WITH RADIOACTIVE SEED LOCALIZATION Left 08/18/2018    Procedure: LEFT BREAST LUMPECTOMY WITH RADIOACTIVE SEED LOCALIZATION;  Surgeon: Curvin Deward MOULD, MD;  Location: [facility] OR;  Service: General;  Laterality: Left;   CORONARY ARTERY BYPASS GRAFT  1992   EYE SURGERY     cataract removal 2017   TONSILLECTOMY     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   Flatulence, eructation and gas pain 11/24/2021   Rectal bleeding 11/24/2021   Chronic systolic heart failure (HCC) 11/20/2021   Insomnia 11/20/2021   Myocardial infarction [facility])    Chronic kidney disease    Cancer (HCC)    Breast cancer (HCC)    Anxiety    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   Cardiomyopathy (HCC) 08/01/2018   Left bundle branch block 08/01/2018   Preop cardiovascular exam 08/01/2018   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   Chronic kidney disease, stage 3b (HCC) 03/25/2015   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   CAD (coronary artery disease), native coronary artery    Obesity (BMI 30-39.9)    History of MI (myocardial infarction) 03/10/2015   History of colonic polyps 03/10/2015   Vitamin D  deficiency 03/10/2015   Hypertensive heart  disease     Hyperlipidemia    Dermatitis 08/14/2013   Essential hypertension 05/02/2012    ONSET DATE: 02/09/2024  REFERRING DIAG: I63.9 (ICD-10-CM) - Acute ischemic stroke  THERAPY DIAG:  Other lack of coordination  Muscle weakness (generalized)  Other disturbances of skin sensation  Other symptoms and signs involving the nervous system  Rationale for Evaluation and Treatment: Rehabilitation  SUBJECTIVE:   SUBJECTIVE STATEMENT: Pt reports difficulty with sleep, waking up every 1.5-2 hours to go to the restroom and getting maybe 7-8 hours in total. Endorses eye fatigue, especially at end of the day.   Pt hasn't really had any ocular migraines lately.   Pt accompanied by: significant other -  husband waiting in lobby and drove her  PERTINENT HISTORY:  Patient presents to ED secondary to complaint of numbness, right upper extremity weakness, right facial droop and slurred speech, MRI brain significant for acute CVA - acute infarct of left central stenosis, admitted for further workup with DC recommending outpt OT/PT/ST  PMHx: anxiety, Breast CA, CAD, cardiomyopathy, s/p CABG, CKD, MI, HLD, HTN, LBBB, Vit D deficiency   PRECAUTIONS: Other: heart monitor in place until July 6th  WEIGHT BEARING RESTRICTIONS: No  PAIN:  Are you having pain? No  FALLS: Has patient fallen in last 6 months? No - Last fall was in 2024 - stepping out of the shower and fell to her knees  LIVING ENVIRONMENT: Lives with: lives with their spouse Lives in: House Stairs: Yes: External: 1 step to deck at back door; none Has following equipment at home: shower chair built into walk in shower and Grab bar on back wall and one to step into shower  PLOF: Independent and still drove  PATIENT GOALS: Get back where I was before  OBJECTIVE:  Note: Objective measures were completed at Evaluation unless otherwise noted.  HAND DOMINANCE: Right  ADLs: Overall ADLs: Mostly independent, has not had as much energy as before Transfers/ambulation related to ADLs: Ind w/o AE Eating: Ind Grooming: Ind although overhead reaching is tiresome UB Dressing: Ind LB Dressing: Ind Toileting: Ind Bathing: Ind Tub Shower transfers: Mod Ind Equipment: Grab bars, Walk in shower, and Built in shower seat  IADLs: Shopping: Hasn't been recently, son orders online and has it delivered Light housekeeping: Husband helping, pt reports she was dropping things Meal Prep: nothing elaborate, using stove and microwave Community mobility: hasn't been getting out a lot since she's been home Medication management: Uses a pill box and sorts her medication Financial management: Pt does her own bills and husband does his Handwriting:  90% legible - less flourishes etc.  Past hobbies - Crochet/knit, painted landscapes and ballet dancers   MOBILITY STATUS: Independent  POSTURE COMMENTS:  rounded shoulders and forward head Sitting balance: WFL  ACTIVITY TOLERANCE: Activity tolerance: Less than before, trying to work up endurance  FUNCTIONAL OUTCOME MEASURES: Quick Dash: 22.7  UPPER EXTREMITY ROM:    Active ROM Right eval Left eval  Shoulder flexion <100 <100  Shoulder abduction    Shoulder adduction    Shoulder extension    Shoulder internal rotation    Shoulder external rotation    Elbow flexion WFL WNL  Elbow extension    Wrist flexion    Wrist extension    Wrist ulnar deviation    Wrist radial deviation    Wrist pronation    Wrist supination    (Blank rows = not tested)  UPPER EXTREMITY MMT:     MMT Right eval Left  eval  Shoulder flexion 4 4  Shoulder abduction    Shoulder adduction    Shoulder extension    Shoulder internal rotation    Shoulder external rotation    Middle trapezius    Lower trapezius    Elbow flexion 5 5  Elbow extension    Wrist flexion    Wrist extension    Wrist ulnar deviation    Wrist radial deviation    Wrist pronation    Wrist supination    (Blank rows = not tested)  HAND FUNCTION: Grip strength: Right: 18.5, 15.8, 19.4 lbs; Left: 28.2, 26.4, 27.9 lbs Average: Right: 17.9 lbs, Left 27.5 lbs  03/27/24: Right: 24.9, 25.1, 28.6 Left: 32.4, 29.7, 35.2 Average Right: 26.2 lbs Left: 32.4 lbs  COORDINATION: 9 Hole Peg test: Right: 25.36 sec; Left: 25.00 sec Box and Blocks:  Right 45 blocks, Left 43 blocks  SENSATION: Light touch: Impaired   EDEMA: NA  MUSCLE TONE: Generally WFL  COGNITION: Overall cognitive status: Within functional limits for tasks assessed  VISION: Subjective report: Ocular Auras/Migraines Baseline vision: TBA Visual history: TBA Pt reports getting bright, spiky, flashing lights about 1 year ago and would have to sit in the  dark and even cover her head to get it real dark.  She notes sometimes her eyes get tired but feels like she sees fine all around.  VISION ASSESSMENT: Not tested Tracking/Visual pursuits: Decreased smoothness with horizontal tracking especially with movement to Left side as well as decreased peripheral vision which appeared to be greater on the L than R but still decreased on both sides ~ 45*  Patient has difficulty with following activities due to following visual impairments: TBA  OBSERVATIONS: Pt ambulates with no AE and no loss of balance. Slight changes noted in her speech with ST eval already scheduled.                                                                                                                            TODAY'S TREATMENT :    - Therapeutic activities completed for duration as noted below including:  Pt participated in Chain game using BUE for eye-hand coordination, pinch strength, visual perceptual skills and cognition for planning and problem solving.  Game required pt to stretch elastics over 4 pegs on game board to create small triangles to 'capture' spaces and then place a game piece inside the completed triangle/s. Pt able to problem solve to create triangles multiple time with mod cues to begin and no physical difficulty with coordination of L UE to stretch elastics.  Slight issue with sensory awareness of lightweight game pieces. Pt was able to perform in hand manipulation to manage game pieces with extra time and followed directions with verbal and visual to introduce new game/rules. Pt able to play game during social conversation with extra time and guidance.   Therapist reviewed goals with patient and updated patient progression as pt demonstrates improved grip strength ~ 5+  lbs.   Also reviewed and practiced writing/printing with pt demonstrating 100% legibility although still not with the flourish she had before.  Trialled different pencil grips with no  significant improvement noted.   - Self care Education completed for duration as noted below including:  Continued to explore AE to help with ADLs/IADLs due to weakness, incoordination and sensory deficits.  Pt reports peeling vegetables can be hard and options provided via online search from power options to finger peelers.  Pt made aware of options for kitchen duties while reviewing simple joint protection ideas for comfort and ease of daily tasks.  PATIENT EDUCATION: Education details: [employer] officer  Person educated: Patient Education method: Explanation, Demonstration, Verbal cues, and Handouts Education comprehension: verbalized understanding, verbal cues required, and needs further education  HOME EXERCISE PROGRAM: 03/06/24: Putty Activities  03/13/24: Coordination Activities  03/21/2024: vision comp strategies and sleep hygiene  GOALS: Goals reviewed with patient? Yes  SHORT TERM GOALS: Target date: 03/16/24  Patient will demonstrate initial R UE coordination and putty HEP with 25% verbal cues or less for proper execution. Baseline: New to outpt OT Goal status: MET -Pt reports when she has stiffness she use the tendon glides and she has got a couple of games to work on motor skills    2.  Pt will recall the 5 main sensory precautions (cold, heat, sharp/breakable, chemical, and heavy) as needed to prevent injury/harm secondary to impairments.  Baseline: New to outpt OT  Goal status: MET   3. Pt will verbalize understanding of adapted strategies and/or equipment PRN to increase safety and independence with ADLs and IADLs (I.e. with decreased pain and joint protection).             Baseline: New to outpt OT with dominant RUE affected by stroke and arthritis             Goal Status: MET  4.  Patient will demonstrate >20 lbs RUE grip strength as needed to open jars and other containers. Baseline: Right: 17.9 lbs, Left 27.5 lbs Goal status: MET 03/27/24 Average Right: 26.2 lbs  Left: 32.4 lbs   LONG TERM GOALS: Target date: 04/05/24   Patient will demonstrate updated UE HEP with visual handouts only for proper execution.  Baseline: New to outpt OT  Goal status: IN Progress   2.  Patient will demo improved FM coordination as evidenced by completing painting activities per prior hobby with minimal difficulty.  Baseline: New to outpt OT and not engaged in hobbies Goal status: IN Progress   3.  Pt will write a list, card or message with no significant change in her font and maintain >90% legibility with increased self reports of ease. Baseline: Pt finds writing not as easy and her font is slightly less flourished as prior to CVA Goal status: MET    4. Pt will be aware/use sensory stimulation activities to promote improved sensory awareness of R/L hand for stereognosis and fine motor tasks.  Baseline: Sensation is different, dull and less accurate on R side since stroke Goal status: IN Progress  ASSESSMENT:  CLINICAL IMPRESSION: Patient is a 83 y.o. female who was seen today for occupational therapy treatment for UE weakness s/p stroke. Patient currently has met 4/4/ short term goals and is progressing with long term goals.  Pt expresses good understanding of AE and options for AE. Pt will benefit from continued skilled OT services in the outpatient setting to work on impairments as noted at evaluation to help pt  return to PLOF with max safety and ease.    PERFORMANCE DEFICITS: in functional skills including ADLs, IADLs, coordination, dexterity, proprioception, sensation, ROM, strength, flexibility, Fine motor control, Gross motor control, balance, decreased knowledge of precautions, decreased knowledge of use of DME, and vision, cognitive skills including safety awareness, and psychosocial skills including coping strategies, environmental adaptation, and routines and behaviors.   IMPAIRMENTS: are limiting patient from ADLs, leisure, and social participation.    CO-MORBIDITIES: may have co-morbidities  that affects occupational performance. Patient will benefit from skilled OT to address above impairments and improve overall function.  REHAB POTENTIAL: Excellent  PLAN:  OT FREQUENCY: 1x/week  OT DURATION: 6 weeks  PLANNED INTERVENTIONS: 97535 self care/ADL training, 02889 therapeutic exercise, 97530 therapeutic activity, 97112 neuromuscular re-education, 97039 fluidotherapy, 97760 Orthotic Initial, 97763 Orthotic/Prosthetic subsequent, balance training, visual/perceptual remediation/compensation, energy conservation, coping strategies training, patient/family education, and DME and/or AE instructions  RECOMMENDED OTHER SERVICES: ST evaluation already scheduled  CONSULTED AND AGREED WITH PLAN OF CARE: Patient  PLAN FOR NEXT SESSION:  Review vision comp strategies and sleep hygiene as needed Further sensory assessment, stereognosis Sensory precautions Check Putty HEP & Coordination HEP  DC visit or progress report   Clarita LITTIE Pride, OT 03/27/2024, 12:18 PM

## 2024-04-03 ENCOUNTER — Encounter: Payer: Self-pay | Admitting: Nurse Practitioner

## 2024-04-03 ENCOUNTER — Ambulatory Visit: Attending: Internal Medicine | Admitting: Nurse Practitioner

## 2024-04-03 VITALS — BP 124/80 | HR 80 | Ht 60.0 in | Wt 201.8 lb

## 2024-04-03 DIAGNOSIS — I255 Ischemic cardiomyopathy: Secondary | ICD-10-CM

## 2024-04-03 DIAGNOSIS — I2510 Atherosclerotic heart disease of native coronary artery without angina pectoris: Secondary | ICD-10-CM | POA: Diagnosis not present

## 2024-04-03 DIAGNOSIS — I447 Left bundle-branch block, unspecified: Secondary | ICD-10-CM

## 2024-04-03 DIAGNOSIS — N1832 Chronic kidney disease, stage 3b: Secondary | ICD-10-CM

## 2024-04-03 DIAGNOSIS — E785 Hyperlipidemia, unspecified: Secondary | ICD-10-CM

## 2024-04-03 DIAGNOSIS — I10 Essential (primary) hypertension: Secondary | ICD-10-CM | POA: Diagnosis not present

## 2024-04-03 DIAGNOSIS — Z8673 Personal history of transient ischemic attack (TIA), and cerebral infarction without residual deficits: Secondary | ICD-10-CM

## 2024-04-03 DIAGNOSIS — I480 Paroxysmal atrial fibrillation: Secondary | ICD-10-CM

## 2024-04-03 MED ORDER — APIXABAN 5 MG PO TABS: 5.0000 mg | ORAL_TABLET | Freq: Two times a day (BID) | ORAL | 11 refills | Status: AC

## 2024-04-03 NOTE — Progress Notes (Unsigned)
 Office Visit    Patient Name: Alyssa Cardenas Date of Encounter: 04/03/2024  Primary Care Provider:  Larnell Hamilton, MD Primary Cardiologist:  Dorn Lesches, MD  Chief Complaint    83 year old female with a history of  CAD s/p CABG x 4 (LIMA-LAD, SVG-diagonal, SVG-OM1, SVG-PDA) in 1992, ICM, paroxysmal atrial fibrillation, mitral valve regurgitation, LBBB, hypertension, hyperlipidemia, CVA, CKD, and breast cancer who presents for follow-up related to atrial fibrillation.  Past Medical History    Past Medical History:  Diagnosis Date   Anxiety    Breast cancer (HCC)    CAD (coronary artery disease), native coronary artery    Cath 08/27/1991 Normal Left main, calcified proximal LAD, 50% stenosis proximal LAD, 90% stenosis mid LAD, occluded Diag 1, 95% stenosis proximal Diag 2, 95% stenosis proximal CFX, 95% stenosis proximal OM 1, occluded RCA;  CABG w LIMA to LAD, SVG to dx, SVG to OM1-2-circ, SVG to PDA 08/29/1991 Dr. 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   History of colonic polyps 03/10/2015   History of MI (myocardial infarction) 03/10/2015   Old ASMI in 1992 prior to CABG  EF     Hyperlipidemia    Hypertensive heart disease     Insomnia 11/20/2021   Left bundle branch block 08/01/2018   Myocardial infarction (HCC)    Obesity (BMI 30-39.9)    Preop cardiovascular exam 08/01/2018   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   1992   Vitamin D  deficiency 03/10/2015   Past Surgical History:  Procedure Laterality Date   BREAST BIOPSY     BREAST EXCISIONAL BIOPSY Bilateral    BREAST LUMPECTOMY Left    2019 DCIS   BREAST LUMPECTOMY WITH RADIOACTIVE SEED LOCALIZATION Left 08/18/2018   Procedure: LEFT BREAST LUMPECTOMY WITH RADIOACTIVE SEED LOCALIZATION;  Surgeon: Curvin Deward MOULD, MD;  Location: [facility] OR;  Service: General;   Laterality: Left;   CORONARY ARTERY BYPASS GRAFT  1992   EYE SURGERY     cataract removal 2017   TONSILLECTOMY      Allergies  Allergies  Allergen Reactions   Ace Inhibitors Cough and Other (See Comments)   Latex Hives and Other (See Comments)   Tamoxifen  Tinitus and Other (See Comments)     Labs/Other Studies Reviewed    The following studies were reviewed today:  Cardiac Studies & Procedures   ______________________________________________________________________________________________   STRESS TESTS  MYOCARDIAL PERFUSION IMAGING 08/09/2018  Interpretation Summary  Nuclear stress EF: 34%.  There was no ST segment deviation noted during stress.  Findings consistent with prior myocardial infarction.  This is an intermediate risk study.  The left ventricular ejection fraction is moderately decreased (30-44%).  1. EF 34% with apical and peri-apical akinesis. 2. Fixed, large, severe mid to apical anterolateral, anterior, and anteroseptal perfusion defect involving the apex and the apical inferior wall as well.  This suggests a large area of infarction in the LAD territory without significant ischemia.  Intermediate risk study with large scar and low EF.   ECHOCARDIOGRAM  ECHOCARDIOGRAM COMPLETE 02/06/2024  Narrative ECHOCARDIOGRAM REPORT    Patient Name:   Alyssa Cardenas Date of Exam: 02/06/2024 Medical Rec #:  [id number]       Height:       60.0 in Accession #:    [id number]  Weight:       200.0 lb Date of Birth:  04-28-1941        BSA:          1.867 m Patient Age:    82 years        BP:           124/86 mmHg Patient Gender: F               HR:           65 bpm. Exam Location:  Inpatient  Procedure: 2D Echo, Cardiac Doppler and Color Doppler (Both Spectral and Color Flow Doppler were utilized during procedure).  Indications:     Stroke  History:         Patient has prior history of Echocardiogram examinations, most recent 12/07/2022. Risk  Factors:Hypertension.  Sonographer:     Philomena Daring Referring Phys:  KARNA DELENA GERALDS Diagnosing Phys: Ria Commander  IMPRESSIONS   1. The left ventricle has moderately reduced function with an estimated EF of 30-35%. The left ventricle demonstrates regional wall motion abnormalities (see scoring diagram/findings for description). Left ventricular diastolic parameters are consistent with Grade I diastolic dysfunction (impaired relaxation). There is hypokinesis of the mid to distal anteroseptal, anterior and inferoseptal myocardium. This is unchanged compared to prior echocardiogram. 2. Right ventricular systolic function is normal. The right ventricular size is normal. 3. Left atrial size was mildly dilated. 4. The mitral valve is grossly normal. Mild mitral valve regurgitation. 5. The aortic valve is normal in structure. There is mild calcification of the aortic valve. Aortic valve regurgitation is not visualized. 6. The inferior vena cava is normal in size with greater than 50% respiratory variability, suggesting right atrial pressure of 3 mmHg.  FINDINGS Left Ventricle: Left ventricular ejection fraction, by estimation, is 30 to 35%. The left ventricle has moderately decreased function. The left ventricle demonstrates regional wall motion abnormalities. The left ventricular internal cavity size was normal in size. There is no left ventricular hypertrophy. Left ventricular diastolic parameters are consistent with Grade I diastolic dysfunction (impaired relaxation).   LV Wall Scoring: The mid and distal anterior septum, mid inferoseptal segment, apical anterior segment, and apex are hypokinetic.  Right Ventricle: The right ventricular size is normal. No increase in right ventricular wall thickness. Right ventricular systolic function is normal.  Left Atrium: Left atrial size was mildly dilated.  Right Atrium: Right atrial size was normal in size.  Pericardium: There is no evidence  of pericardial effusion.  Mitral Valve: The mitral valve is grossly normal. There is mild thickening of the mitral valve leaflet(s). Mild mitral annular calcification. Mild mitral valve regurgitation.  Tricuspid Valve: The tricuspid valve is normal in structure. Tricuspid valve regurgitation is trivial.  Aortic Valve: The aortic valve is normal in structure. There is mild calcification of the aortic valve. There is mild aortic valve annular calcification. Aortic valve regurgitation is not visualized.  Pulmonic Valve: The pulmonic valve was not well visualized. Pulmonic valve regurgitation is trivial.  Aorta: The aortic root is normal in size and structure.  Venous: The inferior vena cava is normal in size with greater than 50% respiratory variability, suggesting right atrial pressure of 3 mmHg.  IAS/Shunts: No atrial level shunt detected by color flow Doppler.   LEFT VENTRICLE PLAX 2D LVIDd:         5.00 cm   Diastology LVIDs:         3.50 cm   LV e' medial:  4.13 cm/s LV PW:         1.10 cm   LV E/e' medial:  16.5 LV IVS:        1.40 cm   LV e' lateral:   4.57 cm/s LVOT diam:     2.00 cm   LV E/e' lateral: 14.9 LV SV:         76 LV SV Index:   41 LVOT Area:     3.14 cm   RIGHT VENTRICLE             IVC RV S prime:     11.00 cm/s  IVC diam: 1.30 cm TAPSE (M-mode): 1.6 cm  LEFT ATRIUM             Index        RIGHT ATRIUM           Index LA diam:        3.40 cm 1.82 cm/m   RA Area:     17.00 cm LA Vol (A2C):   35.9 ml 19.23 ml/m  RA Volume:   44.70 ml  23.95 ml/m LA Vol (A4C):   44.6 ml 23.89 ml/m LA Biplane Vol: 41.9 ml 22.45 ml/m AORTIC VALVE LVOT Vmax:   124.00 cm/s LVOT Vmean:  70.200 cm/s LVOT VTI:    0.241 m  AORTA Ao Root diam: 2.60 cm Ao Asc diam:  3.40 cm  MITRAL VALVE MV Area (PHT): 5.13 cm    SHUNTS MV Decel Time: 148 msec    Systemic VTI:  0.24 m MV E velocity: 68.10 cm/s  Systemic Diam: 2.00 cm MV A velocity: 93.80 cm/s MV E/A ratio:   0.73  Aditya Sabharwal Electronically signed by Ria Commander Signature Date/Time: 02/06/2024/1:44:22 PM    Final (Updated)    MONITORS  CARDIAC EVENT MONITOR 03/15/2024  Narrative SR/SB Short runs of PAF Needs OV to discuss possible DOAC in setting of CVA       ______________________________________________________________________________________________     Recent Labs: 02/04/2024: ALT 19 02/06/2024: BUN 14; Creatinine, Ser 1.22; Hemoglobin 12.3; Platelets 212; Potassium 3.7; Sodium 136  Recent Lipid Panel    Component Value Date/Time   CHOL 141 02/05/2024 0217   TRIG 37 02/05/2024 0217   HDL 70 02/05/2024 0217   CHOLHDL 2.0 02/05/2024 0217   VLDL 7 02/05/2024 0217   LDLCALC 64 02/05/2024 0217    History of Present Illness    83 year old female with the above past medical history including CAD s/p CABG x 4 (LIMA-LAD, SVG-diagonal, SVG-OM1, SVG-PDA) in 1992, ICM, paroxysmal atrial fibrillation, mitral valve regurgitation, LBBB, hypertension, hyperlipidemia, CVA, CKD, and breast cancer.   She had a heart attack in 1992 with subsequent CABG x 4.  She has a history of ischemic cardiomyopathy.  She also has a history of breast cancer, surgically addressed.  Lexiscan  Myoview  in 2019 was negative for ischemia. Most recent echocardiogram in 12/2022 showed EF 40 to 45%, hypokinesis of the mid anteroseptal, entire anterolateral and mid anterioseptal walls, mild concentric LVH, severe basal septal hypertrophy, mildly enlarged RV with normal RV systolic function, no significant valvular abnormalities.  She transferred care from Dr. Bernie to Dr. Court due to location.  She was last seen in the office on 12/26/2023 and was stable from a cardiac standpoint.  She was hospitalized from 02/04/2024 to 02/06/2024 in the setting of acute CVA. MRI brain significant for acute infarct of left central stenosis. CTA head and neck with no large vessel occlusion. Echocardiogram showed EF  30 to 35%, LV  RWMA, G1 DD, normal RV systolic function, mild mitral valve regurgitation. 30-day cardiac monitor revealed short runs of paroxysmal atrial fibrillation.  She presents today for follow-up.  Since her last visit and recent hospitalization Been stable from a cardiac standpoint.  She denies any chest pain, palpitations, dizziness, dyspnea, PND, orthopnea, weight gain.  She has stable nonpitting bilateral lower extremity edema.  Generally euvolemic well compensated on exam.  Will start Eliquis  5 mg twice daily.  With Dr. Wadie, will defer to neurology, possible discontinuation of Plavix .  Will continue for now.  Follow-up in 3 to 4 months.  Will check labs at that time.  Consider repeat echocardiogram at follow-up. 1. CAD: S/p CABG x 4 (LIMA-LAD, SVG-diagonal, SVG-OM1, SVG-PDA) in 1992. Stable with no anginal symptoms. No indication for ischemic evaluation.  Continue aspirin , metoprolol , amlodipine , Entresto , Crestor , and Zetia .   2. ICM: EF improved from 30 to 35% to 40 to 45% with initiation of Entresto .  Most recent echocardiogram in 12/2022 showed EF 40 to 45%, hypokinesis of the mid anteroseptal, entire anterolateral and mid anterioseptal walls, mild concentric LVH, severe basal septal hypertrophy, mildly enlarged RV with normal RV systolic function, no significant valvular abnormalities. Euvolemic and well compensated on exam.  Further escalation of GDMT limited in the setting of borderline low BP.  Continue current medications as above.   3.  Paroxysmal atrial fibrillation:   4. Hypertension: BP well controlled. Continue current antihypertensive regimen.    5. Hyperlipidemia: LDL was 68 in 06/2022.  Monitored and managed per PCP (she will have repeat labs later this month).  Continue Crestor , Zetia .   6.  LBBB: Chronic, stable.   7. CKD stage III: Creatinine was stable at 1.21 in 09/2022.  Repeat labs pending per PCP.   8. Disposition: Follow-up in  Home Medications    Current Outpatient  Medications  Medication Sig Dispense Refill   Cholecalciferol  (VITAMIN D ) 50 MCG (2000 UT) CAPS Take 1 capsule (2,000 Units total) by mouth daily. (Patient taking differently: Take 1 capsule by mouth daily at 12 noon.) 30 capsule    clopidogrel  (PLAVIX ) 75 MG tablet Take 75 mg by mouth daily.     ezetimibe  (ZETIA ) 10 MG tablet TAKE 1 TABLET(10 MG) BY MOUTH DAILY (Patient taking differently: Take 10 mg by mouth at bedtime.) 90 tablet 1   fluticasone (FLONASE) 50 MCG/ACT nasal spray Place 2 sprays into both nostrils daily as needed for allergies or rhinitis.     Krill Oil 350 MG CAPS Take 350 mg by mouth daily at 12 noon.     metoprolol  succinate (TOPROL -XL) 25 MG 24 hr tablet Take 25 mg by mouth daily.     pantoprazole  (PROTONIX ) 40 MG tablet Take 1 tablet (40 mg total) by mouth daily. 30 tablet 0   Polyvinyl Alcohol-Povidone PF 1.4-0.6 % SOLN Place 2 drops into both eyes daily as needed (for dry eyes).     potassium chloride  SA (KLOR-CON ) 20 MEQ tablet Take 20 mEq by mouth daily at 12 noon.     rosuvastatin  (CRESTOR ) 40 MG tablet Take 40 mg by mouth every evening.      sacubitril -valsartan  (ENTRESTO ) 49-51 MG TAKE 1 TABLET BY MOUTH TWICE DAILY 180 tablet 2   aspirin  EC 81 MG tablet Take 1 tablet (81 mg total) by mouth daily. (Patient not taking: Reported on 03/08/2024) 30 tablet 0   dimenhyDRINATE (DRAMAMINE) 50 MG tablet Take 50 mg by mouth daily as needed for dizziness.  (  Patient not taking: Reported on 04/03/2024)     loratadine (CLARITIN) 10 MG tablet Take 10 mg by mouth daily as needed for allergies.  (Patient not taking: Reported on 04/03/2024)     metoprolol  succinate (TOPROL -XL) 50 MG 24 hr tablet Take 0.5 tablets (25 mg total) by mouth daily. Take with or immediately following a meal. (Patient not taking: Reported on 03/08/2024)     nitroGLYCERIN  (NITROSTAT ) 0.4 MG SL tablet Place 1 tablet (0.4 mg total) under the tongue every 5 (five) minutes as needed for chest pain. (Patient not taking:  Reported on 04/03/2024) 25 tablet 3   No current facility-administered medications for this visit.     Review of Systems    ***.  All other systems reviewed and are otherwise negative except as noted above.    Physical Exam    VS:  BP 124/80 (BP Location: Left Arm, Patient Position: Sitting, Cuff Size: Normal)   Pulse 80   Ht 5' (1.524 m)   Wt 201 lb 12.8 oz (91.5 kg)   SpO2 97%   BMI 39.41 kg/m  , BMI Body mass index is 39.41 kg/m.     GEN: Well nourished, well developed, in no acute distress. HEENT: normal. Neck: Supple, no JVD, carotid bruits, or masses. Cardiac: RRR, no murmurs, rubs, or gallops. No clubbing, cyanosis, edema.  Radials/DP/PT 2+ and equal bilaterally.  Respiratory:  Respirations regular and unlabored, clear to auscultation bilaterally. GI: Soft, nontender, nondistended, BS + x 4. MS: no deformity or atrophy. Skin: warm and dry, no rash. Neuro:  Strength and sensation are intact. Psych: Normal affect.  Accessory Clinical Findings    ECG personally reviewed by me today -    - no acute changes.   Lab Results  Component Value Date   WBC 5.1 02/06/2024   HGB 12.3 02/06/2024   HCT 39.9 02/06/2024   MCV 87.7 02/06/2024   PLT 212 02/06/2024   Lab Results  Component Value Date   CREATININE 1.22 (H) 02/06/2024   BUN 14 02/06/2024   NA 136 02/06/2024   K 3.7 02/06/2024   CL 103 02/06/2024   CO2 23 02/06/2024   Lab Results  Component Value Date   ALT 19 02/04/2024   AST 32 02/04/2024   ALKPHOS 47 02/04/2024   BILITOT 0.6 02/04/2024   Lab Results  Component Value Date   CHOL 141 02/05/2024   HDL 70 02/05/2024   LDLCALC 64 02/05/2024   TRIG 37 02/05/2024   CHOLHDL 2.0 02/05/2024    Lab Results  Component Value Date   HGBA1C 5.7 (H) 02/04/2024    Assessment & Plan    1.  ***      Damien JAYSON Braver, NP 04/03/2024, 1:30 PM

## 2024-04-03 NOTE — Patient Instructions (Signed)
 Medication Instructions:  Start Eliquis  5 mg twice daily Continue Plavix  at this time. Our office will contact you if you have to stop Plavix .   *If you need a refill on your cardiac medications before your next appointment, please call your pharmacy*  Lab Work: NONE ordered at this time of appointment   Testing/Procedures: NONE ordered at this time of appointment   Follow-Up: At [facility], you and your health needs are our priority.  As part of our continuing mission to provide you with exceptional heart care, our providers are all part of one team.  This team includes your primary Cardiologist (physician) and Advanced Practice Providers or APPs (Physician Assistants and Nurse Practitioners) who all work together to provide you with the care you need, when you need it.  Your next appointment:   3-4 month(s)  Provider:   Dorn Lesches, MD or Damien Braver, NP          We recommend signing up for the patient portal called MyChart.  Sign up information is provided on this After Visit Summary.  MyChart is used to connect with patients for Virtual Visits (Telemedicine).  Patients are able to view lab/test results, encounter notes, upcoming appointments, etc.  Non-urgent messages can be sent to your provider as well.   To learn more about what you can do with MyChart, go to [email or web address].

## 2024-04-04 ENCOUNTER — Encounter: Payer: Self-pay | Admitting: Nurse Practitioner

## 2024-04-05 ENCOUNTER — Ambulatory Visit: Admitting: Occupational Therapy

## 2024-04-05 ENCOUNTER — Ambulatory Visit: Admitting: Speech Pathology

## 2024-04-05 DIAGNOSIS — M6281 Muscle weakness (generalized): Secondary | ICD-10-CM

## 2024-04-05 DIAGNOSIS — R471 Dysarthria and anarthria: Secondary | ICD-10-CM | POA: Diagnosis not present

## 2024-04-05 DIAGNOSIS — R208 Other disturbances of skin sensation: Secondary | ICD-10-CM

## 2024-04-05 DIAGNOSIS — R29818 Other symptoms and signs involving the nervous system: Secondary | ICD-10-CM

## 2024-04-05 DIAGNOSIS — R278 Other lack of coordination: Secondary | ICD-10-CM

## 2024-04-05 NOTE — Therapy (Signed)
 OUTPATIENT OCCUPATIONAL THERAPY NEURO TREATMENT & DISCHARGE  Patient Name: Alyssa Cardenas MRN: [id number] DOB:Oct 01, 1940, 83 y.o., female Today's Date: 04/05/2024  OCCUPATIONAL THERAPY DISCHARGE SUMMARY  Visits from Start of Care: 6  Current functional level related to goals / functional outcomes: Patient has met 4/4 short-term goals and 3/4 long-term goals to date.   Remaining deficits: No significant functional limitations at d/c   Education / Equipment: Continue with HEP as needed   Patient agrees to discharge. Patient goals were met. Patient is being discharged due to meeting the stated rehab goals.SABRA  PCP: Larnell Hamilton, MD REFERRING PROVIDER: Rosemarie Eather RAMAN, MD  END OF SESSION:  OT End of Session - 04/05/24 1239     Visit Number 6    Number of Visits 6    Date for OT Re-Evaluation 04/05/24    Authorization Type [employer] Medicare    Authorization Time Period Auth Required VL: MN    OT Start Time 1238    OT Stop Time 1316    OT Time Calculation (min) 38 min    Activity Tolerance Patient tolerated treatment well    Behavior During Therapy WFL for tasks assessed/performed          Past Medical History:  Diagnosis Date   Anxiety    Breast cancer (HCC)    CAD (coronary artery disease), native coronary artery    Cath 08/27/1991 Normal Left main, calcified proximal LAD, 50% stenosis proximal LAD, 90% stenosis mid LAD, occluded Diag 1, 95% stenosis proximal Diag 2, 95% stenosis proximal CFX, 95% stenosis proximal OM 1, occluded RCA;  CABG w LIMA to LAD, SVG to dx, SVG to OM1-2-circ, SVG to PDA 08/29/1991 Dr. 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   1992   Vitamin D  deficiency 03/10/2015   Past Surgical History:  Procedure Laterality Date   BREAST BIOPSY     BREAST EXCISIONAL BIOPSY Bilateral    BREAST LUMPECTOMY Left    2019 DCIS   BREAST LUMPECTOMY WITH RADIOACTIVE SEED LOCALIZATION Left 08/18/2018   Procedure: LEFT BREAST LUMPECTOMY WITH RADIOACTIVE SEED LOCALIZATION;  Surgeon: Curvin Deward MOULD, MD;  Location[facility] OR;  Service: General;  Laterality: Left;   CORONARY ARTERY BYPASS GRAFT  1992   EYE SURGERY     cataract removal 2017   TONSILLECTOMY     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   Flatulence, eructation and gas pain 11/24/2021   Rectal bleeding 11/24/2021   Chronic systolic heart failure (HCC) 11/20/2021   Insomnia 11/20/2021   Myocardial infarction [facility])    Chronic kidney disease    Cancer (HCC)    Breast cancer (HCC)    Anxiety    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   Cardiomyopathy (HCC) 08/01/2018   Left bundle  branch block 08/01/2018   Preop cardiovascular exam 08/01/2018   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   Chronic kidney disease, stage 3b (HCC) 03/25/2015   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   CAD (coronary artery disease), native coronary artery    Obesity (BMI 30-39.9)    History of MI (myocardial infarction) 03/10/2015   History of colonic polyps 03/10/2015   Vitamin D  deficiency 03/10/2015   Hypertensive heart disease     Hyperlipidemia    Dermatitis 08/14/2013   Essential hypertension 05/02/2012    ONSET DATE: 02/09/2024  REFERRING DIAG: I63.9 (ICD-10-CM) - Acute ischemic stroke  THERAPY DIAG:  Other lack of coordination  Muscle weakness  (generalized)  Other disturbances of skin sensation  Other symptoms and signs involving the nervous system  Rationale for Evaluation and Treatment: Rehabilitation  SUBJECTIVE:   SUBJECTIVE STATEMENT: Pt reports little to no difficulty with ADL and IADL completion. No questions about HEP.  Pt accompanied by: significant other - husband waiting in lobby and drove her  PERTINENT HISTORY:  Patient presents to ED secondary to complaint of numbness, right upper extremity weakness, right facial droop and slurred speech, MRI brain significant for acute CVA - acute infarct of left central stenosis, admitted for further workup with DC recommending outpt OT/PT/ST  PMHx: anxiety, Breast CA, CAD, cardiomyopathy, s/p CABG, CKD, MI, HLD, HTN, LBBB, Vit D deficiency   PRECAUTIONS: Other: heart monitor in place until July 6th  WEIGHT BEARING RESTRICTIONS: No  PAIN:  Are you having pain? No  FALLS: Has patient fallen in last 6 months? No - Last fall was in 2024 - stepping out of the shower and fell to her knees  LIVING ENVIRONMENT: Lives with: lives with their spouse Lives in: House Stairs: Yes: External: 1 step to deck at back door; none Has following equipment at home: shower chair built into walk in shower and Grab bar on back wall and one to step into shower  PLOF: Independent and still drove  PATIENT GOALS: Get back where I was before  OBJECTIVE:  Note: Objective measures were completed at Evaluation unless otherwise noted.  HAND DOMINANCE: Right  ADLs: Overall ADLs: Mostly independent, has not had as much energy as before Transfers/ambulation related to ADLs: Ind w/o AE Eating: Ind Grooming: Ind although overhead reaching is tiresome UB Dressing: Ind LB Dressing: Ind Toileting: Ind Bathing: Ind Tub Shower transfers: Mod Ind Equipment: Grab bars, Walk in shower, and Built in shower seat  IADLs: Shopping: Hasn't been recently, son orders online and has it delivered Light  housekeeping: Husband helping, pt reports she was dropping things Meal Prep: nothing elaborate, using stove and microwave Community mobility: hasn't been getting out a lot since she's been home Medication management: Uses a pill box and sorts her medication Financial management: Pt does her own bills and husband does his Handwriting: 90% legible - less flourishes etc.  Past hobbies - Crochet/knit, painted landscapes and ballet dancers   MOBILITY STATUS: Independent  POSTURE COMMENTS:  rounded shoulders and forward head Sitting balance: WFL  ACTIVITY TOLERANCE: Activity tolerance: Less than before, trying to work up endurance  FUNCTIONAL OUTCOME MEASURES: Quick Dash: 22.7 04/05/2024: 0% disability  UPPER EXTREMITY ROM:    Active ROM Right eval Left eval  Shoulder flexion <100 <100  Shoulder abduction    Shoulder adduction    Shoulder extension    Shoulder internal rotation    Shoulder external rotation    Elbow flexion WFL WNL  Elbow  extension    Wrist flexion    Wrist extension    Wrist ulnar deviation    Wrist radial deviation    Wrist pronation    Wrist supination    (Blank rows = not tested)  UPPER EXTREMITY MMT:     MMT Right eval Left eval  Shoulder flexion 4 4  Shoulder abduction    Shoulder adduction    Shoulder extension    Shoulder internal rotation    Shoulder external rotation    Middle trapezius    Lower trapezius    Elbow flexion 5 5  Elbow extension    Wrist flexion    Wrist extension    Wrist ulnar deviation    Wrist radial deviation    Wrist pronation    Wrist supination    (Blank rows = not tested)  HAND FUNCTION: Grip strength: Right: 18.5, 15.8, 19.4 lbs; Left: 28.2, 26.4, 27.9 lbs Average: Right: 17.9 lbs, Left 27.5 lbs  03/27/24: Right: 24.9, 25.1, 28.6 Left: 32.4, 29.7, 35.2 Average Right: 26.2 lbs Left: 32.4 lbs  COORDINATION: 9 Hole Peg test: Right: 25.36 sec; Left: 25.00 sec Box and Blocks:  Right 45 blocks, Left 43  blocks  SENSATION: Light touch: Impaired   EDEMA: NA  MUSCLE TONE: Generally WFL  COGNITION: Overall cognitive status: Within functional limits for tasks assessed  VISION: Subjective report: Ocular Auras/Migraines Baseline vision: TBA Visual history: TBA Pt reports getting bright, spiky, flashing lights about 1 year ago and would have to sit in the dark and even cover her head to get it real dark.  She notes sometimes her eyes get tired but feels like she sees fine all around.  VISION ASSESSMENT: Not tested Tracking/Visual pursuits: Decreased smoothness with horizontal tracking especially with movement to Left side as well as decreased peripheral vision which appeared to be greater on the L than R but still decreased on both sides ~ 45*  Patient has difficulty with following activities due to following visual impairments: TBA  OBSERVATIONS: Pt ambulates with no AE and no loss of balance. Slight changes noted in her speech with ST eval already scheduled.                                                                                                                            TODAY'S TREATMENT :    Objective measures assessed as noted in Goals section to determine progression towards goals. Therapist reviewed goals with patient and updated patient progression.  No additional functional limitations identified. PATIENT EDUCATION: Education details: goal progression; OT d/c Person educated: Patient Education method: Explanation and Handouts Education comprehension: verbalized understanding and verbal cues required  HOME EXERCISE PROGRAM: 03/06/24: Putty Activities  03/13/24: Coordination Activities  03/21/2024: vision comp strategies and sleep hygiene 04/05/2024: Golf solitaire  GOALS: Goals reviewed with patient? Yes  SHORT TERM GOALS: Target date: 03/16/24  Patient will demonstrate initial R UE coordination and putty HEP with 25% verbal cues or less  for proper  execution. Baseline: New to outpt OT Goal status: MET -Pt reports when she has stiffness she use the tendon glides and she has got a couple of games to work on motor skills    2.  Pt will recall the 5 main sensory precautions (cold, heat, sharp/breakable, chemical, and heavy) as needed to prevent injury/harm secondary to impairments.  Baseline: New to outpt OT  Goal status: MET   3. Pt will verbalize understanding of adapted strategies and/or equipment PRN to increase safety and independence with ADLs and IADLs (I.e. with decreased pain and joint protection).             Baseline: New to outpt OT with dominant RUE affected by stroke and arthritis             Goal Status: MET  4.  Patient will demonstrate >20 lbs RUE grip strength as needed to open jars and other containers. Baseline: Right: 17.9 lbs, Left 27.5 lbs Goal status: MET 03/27/24 Average Right: 26.2 lbs Left: 32.4 lbs   LONG TERM GOALS: Target date: 04/05/24   Patient will demonstrate updated UE HEP with visual handouts only for proper execution.  Baseline: New to outpt OT  Goal status: MET   2.  Patient will demo improved FM coordination as evidenced by completing painting activities per prior hobby with minimal difficulty.  Baseline: New to outpt OT and not engaged in hobbies 04/05/2024: has used colored pencils but has not painted since the stroke Goal status: IN Progress   3.  Pt will write a list, card or message with no significant change in her font and maintain >90% legibility with increased self reports of ease. Baseline: Pt finds writing not as easy and her font is slightly less flourished as prior to CVA Goal status: MET    4. Pt will be aware/use sensory stimulation activities to promote improved sensory awareness of R/L hand for stereognosis and fine motor tasks.  Baseline: Sensation is different, dull and less accurate on R side since stroke Goal status: MET  ASSESSMENT:  CLINICAL IMPRESSION: Patient is  appropriate for discharge and no longer demonstrates medical necessity for continued skilled occupational therapy services. PERFORMANCE DEFICITS: in functional skills including ADLs, IADLs, coordination, dexterity, proprioception, sensation, ROM, strength, flexibility, Fine motor control, Gross motor control, balance, decreased knowledge of precautions, decreased knowledge of use of DME, and vision, cognitive skills including safety awareness, and psychosocial skills including coping strategies, environmental adaptation, and routines and behaviors.   PLAN:  OT D/C  CONSULTED AND AGREED WITH PLAN OF CARE: Patient  Jocelyn CHRISTELLA Bottom, OT 04/05/2024, 12:45 PM

## 2024-06-04 ENCOUNTER — Ambulatory Visit: Admitting: Neurology

## 2024-06-04 ENCOUNTER — Encounter: Payer: Self-pay | Admitting: Neurology

## 2024-06-04 VITALS — BP 137/77 | HR 69 | Ht 60.0 in | Wt 199.0 lb

## 2024-06-04 DIAGNOSIS — Z9189 Other specified personal risk factors, not elsewhere classified: Secondary | ICD-10-CM

## 2024-06-04 DIAGNOSIS — I63412 Cerebral infarction due to embolism of left middle cerebral artery: Secondary | ICD-10-CM

## 2024-06-04 DIAGNOSIS — I480 Paroxysmal atrial fibrillation: Secondary | ICD-10-CM

## 2024-06-04 NOTE — Patient Instructions (Signed)
 I had a long d/w patient about her recent stroke,diagnosis of paroxysmal atrial fibrillation, risk for recurrent stroke/TIAs, personally independently reviewed imaging studies and stroke evaluation results and answered questions.Continue Eliquis  (apixaban ) daily  for secondary stroke prevention and stop plavix  now and maintain strict control of hypertension with blood pressure goal below 130/90, diabetes with hemoglobin A1c goal below 6.5% and lipids with LDL cholesterol goal below 70 mg/dL. I also advised the patient to eat a healthy diet with plenty of whole grains, cereals, fruits and vegetables, exercise regularly and maintain ideal body weight Followup in the future with me only as needed and no f/u appointment was made  Stroke Prevention Some medical conditions and behaviors can lead to a higher chance of having a stroke. You can help prevent a stroke by eating healthy, exercising, not smoking, and managing any medical conditions you have. Stroke is a leading cause of functional impairment. Primary prevention is particularly important because a majority of strokes are first-time events. Stroke changes the lives of not only those who experience a stroke but also their family and other caregivers. How can this condition affect me? A stroke is a medical emergency and should be treated right away. A stroke can lead to brain damage and can sometimes be life-threatening. If a person gets medical treatment right away, there is a better chance of surviving and recovering from a stroke. What can increase my risk? The following medical conditions may increase your risk of a stroke: Cardiovascular disease. High blood pressure (hypertension). Diabetes. High cholesterol. Sickle cell disease. Blood clotting disorders (hypercoagulable state). Obesity. Sleep disorders (obstructive sleep apnea). Other risk factors include: Being older than age 13. Having a history of blood clots, stroke, or mini-stroke  (transient ischemic attack, TIA). Genetic factors, such as race, ethnicity, or a family history of stroke. Smoking cigarettes or using other tobacco products. Taking birth control pills, especially if you also use tobacco. Heavy use of alcohol or drugs, especially cocaine and methamphetamine. Physical inactivity. What actions can I take to prevent this? Manage your health conditions High cholesterol levels. Eating a healthy diet is important for preventing high cholesterol. If cholesterol cannot be managed through diet alone, you may need to take medicines. Take any prescribed medicines to control your cholesterol as told by your health care provider. Hypertension. To reduce your risk of stroke, try to keep your blood pressure below 130/80. Eating a healthy diet and exercising regularly are important for controlling blood pressure. If these steps are not enough to manage your blood pressure, you may need to take medicines. Take any prescribed medicines to control hypertension as told by your health care provider. Ask your health care provider if you should monitor your blood pressure at home. Have your blood pressure checked every year, even if your blood pressure is normal. Blood pressure increases with age and some medical conditions. Diabetes. Eating a healthy diet and exercising regularly are important parts of managing your blood sugar (glucose). If your blood sugar cannot be managed through diet and exercise, you may need to take medicines. Take any prescribed medicines to control your diabetes as told by your health care provider. Get evaluated for obstructive sleep apnea. Talk to your health care provider about getting a sleep evaluation if you snore a lot or have excessive sleepiness. Make sure that any other medical conditions you have, such as atrial fibrillation or atherosclerosis, are managed. Nutrition Follow instructions from your health care provider about what to eat or drink  to help  manage your health condition. These instructions may include: Reducing your daily calorie intake. Limiting how much salt (sodium) you use to 1,500 milligrams (mg) each day. Using only healthy fats for cooking, such as olive oil, canola oil, or sunflower oil. Eating healthy foods. You can do this by: Choosing foods that are high in fiber, such as whole grains, and fresh fruits and vegetables. Eating at least 5 servings of fruits and vegetables a day. Try to fill one-half of your plate with fruits and vegetables at each meal. Choosing lean protein foods, such as lean cuts of meat, poultry without skin, fish, tofu, beans, and nuts. Eating low-fat dairy products. Avoiding foods that are high in sodium. This can help lower blood pressure. Avoiding foods that have saturated fat, trans fat, and cholesterol. This can help prevent high cholesterol. Avoiding processed and prepared foods. Counting your daily carbohydrate intake.  Lifestyle If you drink alcohol: Limit how much you have to: 0-1 drink a day for women who are not pregnant. 0-2 drinks a day for men. Know how much alcohol is in your drink. In the [place] one drink equals one 12 oz bottle of beer ( ), one 5 oz glass of wine ( ), or one 1 oz glass of hard liquor (44mL). Do not use any products that contain nicotine or tobacco. These products include cigarettes, chewing tobacco, and vaping devices, such as e-cigarettes. If you need help quitting, ask your health care provider. Avoid secondhand smoke. Do not use drugs. Activity  Try to stay at a healthy weight. Get at least 30 minutes of exercise on most days, such as: Fast walking. Biking. Swimming. Medicines Take over-the-counter and prescription medicines only as told by your health care provider. Aspirin  or blood thinners (antiplatelets or anticoagulants) may be recommended to reduce your risk of forming blood clots that can lead to stroke. Avoid taking birth control  pills. Talk to your health care provider about the risks of taking birth control pills if: You are over 62 years old. You smoke. You get very bad headaches. You have had a blood clot. Where to find more information American Stroke Association: www.strokeassociation.org Get help right away if: You or a loved one has any symptoms of a stroke. BE FAST is an easy way to remember the main warning signs of a stroke: B - Balance. Signs are dizziness, sudden trouble walking, or loss of balance. E - Eyes. Signs are trouble seeing or a sudden change in vision. F - Face. Signs are sudden weakness or numbness of the face, or the face or eyelid drooping on one side. A - Arms. Signs are weakness or numbness in an arm. This happens suddenly and usually on one side of the body. S - Speech. Signs are sudden trouble speaking, slurred speech, or trouble understanding what people say. T - Time. Time to call emergency services. Write down what time symptoms started. You or a loved one has other signs of a stroke, such as: A sudden, severe headache with no known cause. Nausea or vomiting. Seizure. These symptoms may represent a serious problem that is an emergency. Do not wait to see if the symptoms will go away. Get medical help right away. Call your local emergency services (911 in the [place] Do not drive yourself to the hospital. Summary You can help to prevent a stroke by eating healthy, exercising, not smoking, limiting alcohol intake, and managing any medical conditions you may have. Do not use any products that contain nicotine or tobacco. These  include cigarettes, chewing tobacco, and vaping devices, such as e-cigarettes. If you need help quitting, ask your health care provider. Remember BE FAST for warning signs of a stroke. Get help right away if you or a loved one has any of these signs. This information is not intended to replace advice given to you by your health care provider. Make sure you  discuss any questions you have with your health care provider. Document Revised: 07/26/2022 Document Reviewed: 07/26/2022 Elsevier Patient Education  2024 [employer].

## 2024-06-04 NOTE — Progress Notes (Signed)
 [facility] [address] [place]. [place] [address] [phone]       OFFICE FOLLOW-UP NOTE  Alyssa Cardenas Date of Birth:  [birth year] Medical Record Number:  [id number]   HPI: Alyssa Cardenas is a pleasant 83 year old African-American lady seen today for initial office follow-up visit following hospital for stroke in May 2025.  History is obtained from the patient and review of electronic medical records.  I personally reviewed pertinent available imaging films in PACS.  She has past medical history of hypertension, hyperlipidemia, coronary artery disease status post CABG, MI, cardiomyopathy, chronic kidney disease, anxiety and cancer.  She presented on 02/04/2024 to the emergency room via EMS for evaluation for sudden onset right facial droop, slurred speech, right-sided weakness.  NIH stroke scale on admission was 5.  CT head was unremarkable.  CT angiogram of the head and neck showed no large vessel stenosis or occlusion.  MRI subsequently confirmed acute infarct involving lateral aspect of the left central sulcus,.  LDL cholesterol 64 mg percent.  Hemoglobin A1c was 5.7.  Echocardiogram showed ejection fraction of 30 to 35% with hypokinesis of mid to distal anteroseptal and anterior and inferior septal myocardium.  Left atrial size was mildly dilated.  Patient was initially started on aspirin  and Plavix  3 weeks followed by Plavix  but subsequently on 30-day heart monitor was found to have several brief runs of paroxysmal A-fib.  She has since been switched to Eliquis  but is also continuing Plavix  which she is tolerating well but is having minor bruising.  She has had no recurrent stroke or TIA symptoms.  She states she is improved significantly and the facial weakness, speech and right hand weakness.  She still has a minor lack of dexterity in the hand and is doing hand exercises.  She is finished in speech therapy.  She is tolerating Crestor  well without muscle aches and pains.  She  states her blood pressure is under good control and today it is 137/71.  She does admit to snoring but she has never been evaluated for sleep apnea.  She is tolerating Eliquis  well without bruising or bleeding.  ROS:   14 system review of systems is positive for hand weakness, numbness, lack of dexterity, snoring, bruising all other systems negative  PMH:  Past Medical History:  Diagnosis Date   Anxiety    Breast cancer (HCC)    CAD (coronary artery disease), native coronary artery    Cath 08/27/1991 Normal Left main, calcified proximal LAD, 50% stenosis proximal LAD, 90% stenosis mid LAD, occluded Diag 1, 95% stenosis proximal Diag 2, 95% stenosis proximal CFX, 95% stenosis proximal OM 1, occluded RCA;  CABG w LIMA to LAD, SVG to dx, SVG to OM1-2-circ, SVG to PDA 08/29/1991 Dr. 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   History of colonic polyps 03/10/2015   History of MI (myocardial infarction) 03/10/2015   Old ASMI in 1992 prior to CABG  EF     Hyperlipidemia    Hypertensive heart disease     Insomnia 11/20/2021   Left bundle branch block 08/01/2018   Myocardial infarction (HCC)    Obesity (BMI 30-39.9)    Preop cardiovascular exam 08/01/2018   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   1992   Vitamin D  deficiency 03/10/2015    Social History:  Social History   Socioeconomic History   Marital status: Married    Spouse name: Not on file   Number of children: Not on file   Years of education: Not on file   Highest education level: Not on file  Occupational History   Not on file  Tobacco Use   Smoking status: Former    Current packs/day: 0.00    Types: Cigarettes    Quit date: 03/10/1975    Years since quitting: 49.2   Smokeless tobacco: Never  Vaping Use   Vaping status: Never Used  Substance and Sexual Activity    Alcohol use: Yes    Alcohol/week: 0.0 standard drinks of alcohol    Comment: Special occasions   Drug use: No   Sexual activity: Yes    Birth control/protection: Post-menopausal  Other Topics Concern   Not on file  Social History Narrative   Not on file   Social Drivers of Health   Financial Resource Strain: Not on file  Food Insecurity: No Food Insecurity (02/04/2024)   Hunger Vital Sign    Worried About Running Out of Food in the Last Year: Never true    Ran Out of Food in the Last Year: Never true  Transportation Needs: No Transportation Needs (02/04/2024)   PRAPARE - Administrator, Civil Service (Medical): No    Lack of Transportation (Non-Medical): No  Physical Activity: Not on file  Stress: Not on file  Social Connections: Moderately Integrated (02/04/2024)   Social Connection and Isolation Panel    Frequency of Communication with Friends and Family: More than three times a week    Frequency of Social Gatherings with Friends and Family: More than three times a week    Attends Religious Services: 1 to 4 times per year    Active Member of [employer] or Organizations: Not on file    Attends Banker Meetings: Never    Marital Status: Married  Catering manager Violence: Not At Risk (02/04/2024)   Humiliation, Afraid, Rape, and Kick questionnaire    Fear of Current or Ex-Partner: No    Emotionally Abused: No    Physically Abused: No    Sexually Abused: No    Medications:   Current Outpatient Medications on File Prior to Visit  Medication Sig Dispense Refill   apixaban  (ELIQUIS ) 5 MG TABS tablet Take 1 tablet (5 mg total) by mouth 2 (two) times daily. 60 tablet 11   Cholecalciferol  (VITAMIN D ) 50 MCG (2000 UT) CAPS Take 1 capsule (2,000 Units total) by mouth daily. 30 capsule    clopidogrel  (PLAVIX ) 75 MG tablet Take 75 mg by mouth daily.     dimenhyDRINATE (DRAMAMINE) 50 MG tablet Take 50 mg by mouth daily as needed for dizziness.      ezetimibe  (ZETIA )  10 MG tablet TAKE 1 TABLET(10 MG) BY MOUTH DAILY 90 tablet 1   fluticasone (FLONASE) 50 MCG/ACT nasal spray Place 2 sprays into both nostrils daily as needed for allergies or rhinitis.     Krill Oil 350 MG CAPS Take 350 mg by mouth daily at 12 noon.     loratadine (CLARITIN) 10 MG tablet Take 10 mg by mouth daily as needed for allergies.      metoprolol  succinate (TOPROL -XL) 25 MG 24 hr tablet Take 25 mg by mouth daily.     metoprolol  succinate (TOPROL -XL) 50 MG 24 hr tablet Take 0.5 tablets (25 mg total) by mouth daily. Take with or immediately following a meal. (  Patient taking differently: Take 0.5 tablets (25 mg total) by mouth daily. Take with or immediately following a meal.)     nitroGLYCERIN  (NITROSTAT ) 0.4 MG SL tablet Place 1 tablet (0.4 mg total) under the tongue every 5 (five) minutes as needed for chest pain. 25 tablet 3   pantoprazole  (PROTONIX ) 40 MG tablet Take 1 tablet (40 mg total) by mouth daily. 30 tablet 0   Polyvinyl Alcohol-Povidone PF 1.4-0.6 % SOLN Place 2 drops into both eyes daily as needed (for dry eyes).     potassium chloride  SA (KLOR-CON ) 20 MEQ tablet Take 20 mEq by mouth daily at 12 noon.     rosuvastatin  (CRESTOR ) 40 MG tablet Take 40 mg by mouth every evening.      sacubitril -valsartan  (ENTRESTO ) 49-51 MG TAKE 1 TABLET BY MOUTH TWICE DAILY 180 tablet 2   No current facility-administered medications on file prior to visit.    Allergies:   Allergies  Allergen Reactions   Ace Inhibitors Cough and Other (See Comments)   Latex Hives and Other (See Comments)   Tamoxifen  Tinitus and Other (See Comments)    Physical Exam General: Mildly obese elderly African-American lady, seated, in no evident distress Head: head normocephalic and atraumatic.  Neck: supple with no carotid or supraclavicular bruits Cardiovascular: regular rate and rhythm, no murmurs Musculoskeletal: no deformity Skin:  no rash/petichiae Vascular:  Normal pulses all extremities Vitals:    06/04/24 0955  BP: 137/77  Pulse: 69   Neurologic Exam Mental Status: Awake and fully alert. Oriented to place and time. Recent and remote memory intact. Attention span, concentration and fund of knowledge appropriate. Mood and affect appropriate.  Cranial Nerves: Fundoscopic exam reveals sharp disc margins. Pupils equal, briskly reactive to light. Extraocular movements full without nystagmus. Visual fields full to confrontation. Hearing intact. Facial sensation intact. Face, tongue, palate moves normally and symmetrically.  Motor: Normal bulk and tone. Normal strength in all tested extremity muscles.  Diminished fine finger movements on the right.  Albeit left over right upper extremity. Sensory.: intact to touch ,pinprick .position and vibratory sensation.  Coordination: Rapid alternating movements normal in all extremities. Finger-to-nose and heel-to-shin performed accurately bilaterally. Gait and Station: Arises from chair without difficulty. Stance is normal. Gait demonstrates normal stride length and balance . Able to heel, toe and tandem walk with mild difficulty.  Reflexes: 1+ and symmetric. Toes downgoing.   NIHSS  0 Modified Rankin  1   ASSESSMENT: 83 year old lady with left frontal MCA branch infarct in May 2025 initially felt to be cryptogenic but subsequently found to have paroxysmal A-fib and cardiac monitoring.  She is doing very well with practically no residual symptoms.  Vascular risk factors of hyperlipidemia, hypertension, coronary artery disease cardiomyopathy and new diagnosis of paroxysmal A-fib.     PLAN: I had a long d/w patient about her recent stroke,diagnosis of paroxysmal atrial fibrillation, risk for recurrent stroke/TIAs, personally independently reviewed imaging studies and stroke evaluation results and answered questions.Continue Eliquis  (apixaban ) daily  for secondary stroke prevention and stop plavix  now and maintain strict control of hypertension with blood  pressure goal below 130/90, diabetes with hemoglobin A1c goal below 6.5% and lipids with LDL cholesterol goal below 70 mg/dL. I also advised the patient to eat a healthy diet with plenty of whole grains, cereals, fruits and vegetables, exercise regularly and maintain ideal body weight Followup in the future with me only as needed and no f/u appointment was made   I personally spent a total of  35 minutes in the care of the patient today including getting/reviewing separately obtained history, performing a medically appropriate exam/evaluation, counseling and educating, placing orders, referring and communicating with other health care professionals, documenting clinical information in the EHR, independently interpreting results, and coordinating care.        Eather Popp, MD Note: This document was prepared with digital dictation and possible smart phrase technology. Any transcriptional errors that result from this process are unintentional

## 2024-06-08 ENCOUNTER — Other Ambulatory Visit: Payer: Self-pay | Admitting: Internal Medicine

## 2024-06-08 DIAGNOSIS — Z1231 Encounter for screening mammogram for malignant neoplasm of breast: Secondary | ICD-10-CM

## 2024-06-12 ENCOUNTER — Other Ambulatory Visit: Payer: Self-pay | Admitting: Cardiovascular Disease

## 2024-07-09 ENCOUNTER — Ambulatory Visit: Admitting: Cardiovascular Disease

## 2024-07-12 ENCOUNTER — Ambulatory Visit: Attending: Cardiovascular Disease | Admitting: Cardiovascular Disease

## 2024-07-12 ENCOUNTER — Encounter: Payer: Self-pay | Admitting: Cardiovascular Disease

## 2024-07-12 VITALS — BP 124/76 | HR 74 | Ht 60.0 in | Wt 199.6 lb

## 2024-07-12 DIAGNOSIS — E782 Mixed hyperlipidemia: Secondary | ICD-10-CM

## 2024-07-12 DIAGNOSIS — I480 Paroxysmal atrial fibrillation: Secondary | ICD-10-CM | POA: Insufficient documentation

## 2024-07-12 DIAGNOSIS — I119 Hypertensive heart disease without heart failure: Secondary | ICD-10-CM | POA: Diagnosis not present

## 2024-07-12 DIAGNOSIS — I2510 Atherosclerotic heart disease of native coronary artery without angina pectoris: Secondary | ICD-10-CM | POA: Diagnosis not present

## 2024-07-12 DIAGNOSIS — I447 Left bundle-branch block, unspecified: Secondary | ICD-10-CM

## 2024-07-12 DIAGNOSIS — I255 Ischemic cardiomyopathy: Secondary | ICD-10-CM | POA: Diagnosis not present

## 2024-07-12 NOTE — Assessment & Plan Note (Signed)
 History of ischemic cardiomyopathy on GDMT with echo performed 02/06/2024 revealing EF of 30 to 35% with grade 1 diastolic dysfunction and no significant valvular abnormalities.  She does complain of some dyspnea when walking up stairs or an incline.  I am going to refer her to EP to discuss the possibility of ICD/CRT therapy given her left bundle branch block.

## 2024-07-12 NOTE — Assessment & Plan Note (Signed)
 History of essential hypertension with blood pressure measured today at 124/76.  She is on Entresto , and metoprolol .

## 2024-07-12 NOTE — Progress Notes (Signed)
 07/12/2024 [place] Alyssa Cardenas   06-28-1941  [id number]  Primary Physician Larnell Hamilton, MD Primary Cardiologist: Dorn Alyssa Lesches MD GENI Alyssa Cardenas, FSCAI  HPI:  Alyssa Cardenas is a 83 y.o.    mildly overweight married African-American female mother of 1 living child, 1 son died at age 43 with a myocardial infarction, grandmother to 3 grandchildren who is transitioning her care from Dr. Bernie to myself because of location and proximity.  I last saw her in the office/21/25.  She is a retired armed forces operational officer. Her risk factors include treated hypertension and hyperlipidemia. She is not diabetic. Her family history is markable for mother and brother both who had bypass surgery. She apparently had a heart attack back in 1992 and subsequent CABG x 4 by Dr. Carlin Blush with a LIMA to her LAD, vein to a diagonal branch, sequential vein to OM1 into and to the PDA. She does have ischemic cardiomyopathy with an EF in the 40-40 GDMT. She Also Had Breast Cancer Surgically Addressed. She Is Fairly Active and Exercises without Limitation.   Since I saw her in the office 7 months ago she was admitted for ischemic stroke 02/04/2024.  Subsequent event monitor showed runs of PAF which was started on Eliquis .  Her 2D echo performed during the hospitalization revealed EF of 30 to 35% with grade 1 diastolic dysfunction.  LV dysfunction is chronic.  She does complain of some mild dyspnea when walking up stairs or an incline.  She is on GDMT and has a left bundle branch block.  She has improved significantly since her stroke with speech therapy and rehab.   No outpatient medications have been marked as taking for the 07/12/24 encounter (Office Visit) with Cardenas Dorn JINNY, MD.     Allergies  Allergen Reactions   Ace Inhibitors Cough and Other (See Comments)   Latex Hives and Other (See Comments)   Tamoxifen  Tinitus and Other (See Comments)    Social History   Socioeconomic History   Marital status:  Married    Spouse name: Not on file   Number of children: Not on file   Years of education: Not on file   Highest education level: Not on file  Occupational History   Not on file  Tobacco Use   Smoking status: Former    Current packs/day: 0.00    Types: Cigarettes    Quit date: 03/10/1975    Years since quitting: 49.3   Smokeless tobacco: Never  Vaping Use   Vaping status: Never Used  Substance and Sexual Activity   Alcohol use: Yes    Alcohol/week: 0.0 standard drinks of alcohol    Comment: Special occasions   Drug use: No   Sexual activity: Yes    Birth control/protection: Post-menopausal  Other Topics Concern   Not on file  Social History Narrative   Not on file   Social Drivers of Health   Financial Resource Strain: Not on file  Food Insecurity: No Food Insecurity (02/04/2024)   Hunger Vital Sign    Worried About Running Out of Food in the Last Year: Never true    Ran Out of Food in the Last Year: Never true  Transportation Needs: No Transportation Needs (02/04/2024)   PRAPARE - Administrator, Civil Service (Medical): No    Lack of Transportation (Non-Medical): No  Physical Activity: Not on file  Stress: Not on file  Social Connections: Moderately Integrated (02/04/2024)   Social Connection and  Isolation Panel    Frequency of Communication with Friends and Family: More than three times a week    Frequency of Social Gatherings with Friends and Family: More than three times a week    Attends Religious Services: 1 to 4 times per year    Active Member of [employer] or Organizations: Not on file    Attends Banker Meetings: Never    Marital Status: Married  Catering Manager Violence: Not At Risk (02/04/2024)   Humiliation, Afraid, Rape, and Kick questionnaire    Fear of Current or Ex-Partner: No    Emotionally Abused: No    Physically Abused: No    Sexually Abused: No     Review of Systems: General: negative for chills, fever, night sweats or  weight changes.  Cardiovascular: negative for chest pain, dyspnea on exertion, edema, orthopnea, palpitations, paroxysmal nocturnal dyspnea or shortness of breath Dermatological: negative for rash Respiratory: negative for cough or wheezing Urologic: negative for hematuria Abdominal: negative for nausea, vomiting, diarrhea, bright red blood per rectum, melena, or hematemesis Neurologic: negative for visual changes, syncope, or dizziness All other systems reviewed and are otherwise negative except as noted above.    Blood pressure 124/76, pulse 74, height 5' (1.524 m), weight 199 lb 9.6 oz (90.5 kg), SpO2 93%.  General appearance: alert and no distress Neck: no adenopathy, no carotid bruit, no JVD, supple, symmetrical, trachea midline, and thyroid not enlarged, symmetric, no tenderness/mass/nodules Lungs: clear to auscultation bilaterally Heart: regular rate and rhythm, S1, S2 normal, no murmur, click, rub or gallop Extremities: extremities normal, atraumatic, no cyanosis or edema Pulses: 2+ and symmetric Skin: Skin color, texture, turgor normal. No rashes or lesions Neurologic: Grossly normal  EKG EKG Interpretation Date/Time:  Thursday July 12 2024 10:27:41 EST Ventricular Rate:  74 PR Interval:  138 QRS Duration:  200 QT Interval:  474 QTC Calculation: 526 R Axis:   -25  Text Interpretation: Normal sinus rhythm Left bundle branch block When compared with ECG of 04-Feb-2024 15:29, PREVIOUS ECG IS PRESENT Confirmed by Court Carrier [id number]) on 07/12/2024 10:56:03 AM    ASSESSMENT AND PLAN:   Hypertensive heart disease  History of essential hypertension with blood pressure measured today at 124/76.  She is on Entresto , and metoprolol .  Hyperlipidemia History of hyperlipidemia on Zetia  and high-dose rosuvastatin  with lipid profile performed 02/05/2024 revealing total cholesterol 141, LDL 64 and HDL of 70.  CAD (coronary artery disease), native coronary artery History of CAD  status post myocardial infarction back in 1992 and subsequent CABG x 4 by Dr. Tanda with a LIMA to her LAD, vein to diagonal branch, sequential vein to obtuse marginal branches and PDA.  She has been asymptomatic since.  Cardiomyopathy (HCC) History of ischemic cardiomyopathy on GDMT with echo performed 02/06/2024 revealing EF of 30 to 35% with grade 1 diastolic dysfunction and no significant valvular abnormalities.  She does complain of some dyspnea when walking up stairs or an incline.  I am going to refer her to EP to discuss the possibility of ICD/CRT therapy given her left bundle branch block.  Left bundle branch block Chronic  PAF (paroxysmal atrial fibrillation) (HCC) Patient was admitted for ischemic stroke 02/04/2024.  Follow-up event monitor did show episodes of PAF for which she was started on Eliquis .  She is maintaining sinus rhythm.     Carrier DOROTHA Court MD FACP,FACC,FAHA, [facility] 07/12/2024 11:05 AM

## 2024-07-12 NOTE — Assessment & Plan Note (Signed)
 History of CAD status post myocardial infarction back in 1992 and subsequent CABG x 4 by Dr. Tanda with a LIMA to her LAD, vein to diagonal branch, sequential vein to obtuse marginal branches and PDA.  She has been asymptomatic since.

## 2024-07-12 NOTE — Patient Instructions (Signed)
 Medication Instructions:  Your physician recommends that you continue on your current medications as directed. Please refer to the Current Medication list given to you today.  *If you need a refill on your cardiac medications before your next appointment, please call your pharmacy*  Follow-Up: At [facility], you and your health needs are our priority.  As part of our continuing mission to provide you with exceptional heart care, our providers are all part of one team.  This team includes your primary Cardiologist (physician) and Advanced Practice Providers or APPs (Physician Assistants and Nurse Practitioners) who all work together to provide you with the care you need, when you need it.  Your next appointment:   6 month(s)  Provider:   Marlana Silvan, NP         Then, Lauro Portal, MD will plan to see you again in 12 month(s).     We recommend signing up for the patient portal called "MyChart".  Sign up information is provided on this After Visit Summary.  MyChart is used to connect with patients for Virtual Visits (Telemedicine).  Patients are able to view lab/test results, encounter notes, upcoming appointments, etc.  Non-urgent messages can be sent to your provider as well.   To learn more about what you can do with MyChart, go to [email or web address].

## 2024-07-12 NOTE — Assessment & Plan Note (Signed)
 History of hyperlipidemia on Zetia  and high-dose rosuvastatin  with lipid profile performed 02/05/2024 revealing total cholesterol 141, LDL 64 and HDL of 70.

## 2024-07-12 NOTE — Assessment & Plan Note (Signed)
 Chronic

## 2024-07-12 NOTE — Assessment & Plan Note (Signed)
 Patient was admitted for ischemic stroke 02/04/2024.  Follow-up event monitor did show episodes of PAF for which she was started on Eliquis .  She is maintaining sinus rhythm.

## 2024-07-21 NOTE — Progress Notes (Addendum)
 [facility] Worker Note Stroke Post Discharge Follow-Up  Alyssa Cardenas [id number]   Contact Type:    Encounter Date: 07/21/2024  Outreach Project:  [facility] Stroke follow up   Managed Medicaid Plan Participant:   PCP: Larnell Hamilton  Payor Status:  [employer] Medicare       Past Medical History:  Diagnosis Date   Anxiety    Breast cancer (HCC)    CAD (coronary artery disease), native coronary artery    Cath 08/27/1991 Normal Left main, calcified proximal LAD, 50% stenosis proximal LAD, 90% stenosis mid LAD, occluded Diag 1, 95% stenosis proximal Diag 2, 95% stenosis proximal CFX, 95% stenosis proximal OM 1, occluded RCA;  CABG w LIMA to LAD, SVG to dx, SVG to OM1-2-circ, SVG to PDA 08/29/1991 Dr. 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   History of colonic polyps 03/10/2015   History of MI (myocardial infarction) 03/10/2015   Old ASMI in 1992 prior to CABG  EF     Hyperlipidemia    Hypertensive heart disease     Insomnia 11/20/2021   Left bundle branch block 08/01/2018   Myocardial infarction (HCC)    Obesity (BMI 30-39.9)    Preop cardiovascular exam 08/01/2018   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   1992   Vitamin D  deficiency 03/10/2015   Social History   Substance and Sexual Activity  Alcohol Use Yes   Alcohol/week: 0.0 standard drinks of alcohol   Comment: Special occasions   Social History   Substance and Sexual Activity  Drug Use No   Social History   Tobacco Use  Smoking Status Former   Current packs/day: 0.00   Types: Cigarettes   Quit date: 03/10/1975   Years since quitting: 49.4  Smokeless Tobacco Never    SDOH Screenings   Food Insecurity: No Food Insecurity (02/04/2024)  Housing: Unknown (02/04/2024)  Transportation Needs: No Transportation Needs (02/04/2024)  Utilities: Not At Risk  (02/04/2024)  Depression (PHQ2-9): Low Risk  (03/08/2024)  Social Connections: Moderately Integrated (02/04/2024)  Tobacco Use: Medium Risk (07/12/2024)   Referrals (if applicable):        Education: Depression, hypertension and stroke education given    Limiting Factors:  none   Follow-up:  Chart review/ telephone   Follow-up Type:  No follow up to be scheduled per [facility] [employer] protocol    Cherise LOISE Finder, NT

## 2024-07-27 ENCOUNTER — Telehealth: Payer: Self-pay | Admitting: Cardiovascular Disease

## 2024-07-27 ENCOUNTER — Other Ambulatory Visit: Payer: Self-pay

## 2024-07-27 NOTE — Telephone Encounter (Signed)
  1. Name of Medication: ENTRESTO  49-51 MG    2. How are you currently taking this medication (dosage and times per day)? As written   3. Are you having a reaction (difficulty breathing--STAT)? no   4. What is your medication issue? Pt insurance not going to cover this med, needs another option. Please advise.

## 2024-07-27 NOTE — Telephone Encounter (Signed)
 Spoke with Pt. Advised pt that the medication was going generic and her prescription should be ok. Advised pt to call us  back at the beginning of the year if something changes or she has any issues. Pt stated understanding and thanks.

## 2024-08-10 ENCOUNTER — Inpatient Hospital Stay: Admission: RE | Admit: 2024-08-10 | Discharge: 2024-08-10 | Attending: Internal Medicine | Admitting: Internal Medicine

## 2024-08-10 DIAGNOSIS — Z1231 Encounter for screening mammogram for malignant neoplasm of breast: Secondary | ICD-10-CM

## 2024-09-14 ENCOUNTER — Telehealth: Payer: Self-pay | Admitting: Cardiovascular Disease

## 2024-09-14 DIAGNOSIS — I255 Ischemic cardiomyopathy: Secondary | ICD-10-CM

## 2024-09-14 DIAGNOSIS — I5022 Chronic systolic (congestive) heart failure: Secondary | ICD-10-CM

## 2024-09-14 DIAGNOSIS — Z79899 Other long term (current) drug therapy: Secondary | ICD-10-CM

## 2024-09-14 DIAGNOSIS — I119 Hypertensive heart disease without heart failure: Secondary | ICD-10-CM

## 2024-09-14 MED ORDER — ENTRESTO 49-51 MG PO TABS: 1.0000 | ORAL_TABLET | Freq: Two times a day (BID) | ORAL | 3 refills | Status: DC

## 2024-09-14 NOTE — Telephone Encounter (Signed)
 Called and spoke to pt regarding: Elevated BP's and SOB  She called her PCP, who advised her to get in touch with Cardiology.   Her BP normally ranges 120's/60-70's.  This week BP has been: 1/6 - 145/98; pulse=84 1/8 - 151/92 1/9 - 144/89; pulse=79  She is also c/o SOB/DOE. This is not as sever as it was when she first noticed it in early December 2025. At that time she could not ambulate due to the severe SOB. She also had developed a cough with phlegm that has now improved. The ARAVA medication (for RA) was stopped due to the cough. Today, the SOB comes and goes and is worse some days than others. It is more intense with activity. She continues to have some dizziness but this is not new. Denies Chest pain, denies palpitations. Denies headaches.   She has an appointment with Rheumatology and also with her Ophthalmologist on 09/18/24.   Will see Monge, NP in May. Velton Lesches, MD 07/12/24.

## 2024-09-14 NOTE — Addendum Note (Signed)
 Addended by: BLUFORD RAMP D on: 09/14/2024 03:34 PM   Modules accepted: Orders

## 2024-09-14 NOTE — Telephone Encounter (Signed)
" °*  STAT* If patient is at the pharmacy, call can be transferred to refill team.   1. Which medications need to be refilled? (please list name of each medication and dose if known)   ENTRESTO  49-51 MG   2. Would you like to learn more about the convenience, safety, & potential cost savings by using the [facility]?   3. Are you open to using the [facility] (Type [facility]. ).  4. Which pharmacy/location (including street and city if local pharmacy) is medication to be sent to?  [id number] - [place], [place] - [address]   5. Do they need a 30 day or 90 day supply?   90 day  Patient stated she only has 2 tablets left.   Patient has appointment scheduled with CHARLENA Braver, NP on 5/11. "

## 2024-09-14 NOTE — Telephone Encounter (Signed)
 Pt c/o Shortness Of Breath: STAT if SOB developed within the last 24 hours or pt is noticeably SOB on the phone  1. Are you currently SOB (can you hear that pt is SOB on the phone)?   Yes but patient stated it was not as severe as before  2. How long have you been experiencing SOB?   Since around 12/1  3. Are you SOB when sitting or when up moving around?   When up and moving around  4. Are you currently experiencing any other symptoms?  Dizziness, cough, bowel issue  Patient stated she had been taking Leflunomide which may be related to her SOB symptoms but she has since stopped taking this medication.  Patient stated her BP had been trending high but has come down - today's BP 144/89  HR 79.

## 2024-09-14 NOTE — Telephone Encounter (Signed)
 Pt's medication was sent to pt's pharmacy as requested. Confirmation received.

## 2024-09-17 MED ORDER — SPIRONOLACTONE 25 MG PO TABS: 12.5000 mg | ORAL_TABLET | Freq: Every day | ORAL | 3 refills | Status: AC

## 2024-09-17 NOTE — Telephone Encounter (Signed)
 Patient is returning call

## 2024-09-17 NOTE — Telephone Encounter (Signed)
 Left message for patient to call back. Orders pending and awaiting to speak with patient to schedule appt with APP.

## 2024-09-17 NOTE — Telephone Encounter (Signed)
 Called and spoke to pt. Gave her Dr. Ranee recs.   RX sent to Pharmacy. Lab order entered and released. She already has EP OV with [facility] on 10/16/24 and APP on 10/25/24.   Referral for [facility] entered.

## 2024-09-25 ENCOUNTER — Other Ambulatory Visit (HOSPITAL_BASED_OUTPATIENT_CLINIC_OR_DEPARTMENT_OTHER): Payer: Self-pay | Admitting: Rheumatology

## 2024-09-25 DIAGNOSIS — R0602 Shortness of breath: Secondary | ICD-10-CM

## 2024-09-25 DIAGNOSIS — J90 Pleural effusion, not elsewhere classified: Secondary | ICD-10-CM

## 2024-09-26 ENCOUNTER — Telehealth: Payer: Self-pay | Admitting: Cardiovascular Disease

## 2024-09-26 MED ORDER — SACUBITRIL-VALSARTAN 49-51 MG PO TABS: 1.0000 | ORAL_TABLET | Freq: Two times a day (BID) | ORAL | 3 refills | Status: AC

## 2024-09-26 NOTE — Telephone Encounter (Signed)
 Pt c/o medication issue:  1. Name of Medication: ENTRESTO  49-51 MG   2. How are you currently taking this medication (dosage and times per day)? As wriiten  3. Are you having a reaction (difficulty breathing--STAT)? No  4. What is your medication issue? Pharmacy called stating patient insurance is requesting generic prescription  Sacubitril -Valsartan  49-51 mg   to be prescribed to patient

## 2024-10-11 ENCOUNTER — Ambulatory Visit: Payer: Self-pay | Admitting: Cardiovascular Disease

## 2024-10-11 LAB — BASIC METABOLIC PANEL WITH GFR
BUN/Creatinine Ratio: 11 — ABNORMAL LOW (ref 12–28)
BUN: 13 mg/dL (ref 8–27)
CO2: 21 mmol/L (ref 20–29)
Calcium: 9.8 mg/dL (ref 8.7–10.3)
Chloride: 104 mmol/L (ref 96–106)
Creatinine, Ser: 1.21 mg/dL — ABNORMAL HIGH (ref 0.57–1.00)
Glucose: 106 mg/dL — ABNORMAL HIGH (ref 70–99)
Potassium: 4.1 mmol/L (ref 3.5–5.2)
Sodium: 142 mmol/L (ref 134–144)
eGFR: 45 mL/min/{1.73_m2} — ABNORMAL LOW

## 2024-10-16 ENCOUNTER — Ambulatory Visit: Admitting: Cardiology

## 2024-10-25 ENCOUNTER — Ambulatory Visit: Admitting: Emergency Medicine

## 2025-01-14 ENCOUNTER — Ambulatory Visit: Admitting: Nurse Practitioner

## 2025-03-11 ENCOUNTER — Encounter: Admitting: Adult Health
